# Patient Record
Sex: Female | Born: 1935 | Race: White | Hispanic: No | Smoking: Former smoker
Health system: Southern US, Community
[De-identification: ages and names within clinical notes are randomized; demographics above are authoritative.]

## PROBLEM LIST (undated history)

## (undated) DIAGNOSIS — E7800 Pure hypercholesterolemia, unspecified: Secondary | ICD-10-CM

## (undated) DIAGNOSIS — K5790 Diverticulosis of intestine, part unspecified, without perforation or abscess without bleeding: Secondary | ICD-10-CM

## (undated) DIAGNOSIS — I10 Essential (primary) hypertension: Secondary | ICD-10-CM

## (undated) DIAGNOSIS — N289 Disorder of kidney and ureter, unspecified: Secondary | ICD-10-CM

## (undated) DIAGNOSIS — I2510 Atherosclerotic heart disease of native coronary artery without angina pectoris: Secondary | ICD-10-CM

## (undated) DIAGNOSIS — Z95810 Presence of automatic (implantable) cardiac defibrillator: Secondary | ICD-10-CM

## (undated) DIAGNOSIS — K219 Gastro-esophageal reflux disease without esophagitis: Secondary | ICD-10-CM

## (undated) DIAGNOSIS — I4891 Unspecified atrial fibrillation: Secondary | ICD-10-CM

## (undated) HISTORY — PX: CORONARY ARTERY BYPASS GRAFT: SHX141

## (undated) HISTORY — PX: ABDOMINAL AORTIC ANEURYSM REPAIR: SUR1152

## (undated) HISTORY — PX: PACEMAKER PLACEMENT: SHX43

---

## 2008-06-18 ENCOUNTER — Ambulatory Visit: Payer: Self-pay | Admitting: Oncology

## 2008-07-09 ENCOUNTER — Ambulatory Visit: Payer: Self-pay | Admitting: Orthopaedic Surgery

## 2008-07-15 ENCOUNTER — Ambulatory Visit: Payer: Self-pay | Admitting: Orthopaedic Surgery

## 2008-07-18 ENCOUNTER — Ambulatory Visit: Payer: Self-pay | Admitting: Oncology

## 2008-07-19 ENCOUNTER — Ambulatory Visit: Payer: Self-pay | Admitting: Oncology

## 2008-07-25 ENCOUNTER — Ambulatory Visit: Payer: Self-pay | Admitting: Oncology

## 2008-08-14 ENCOUNTER — Ambulatory Visit: Payer: Self-pay | Admitting: Orthopaedic Surgery

## 2008-08-16 ENCOUNTER — Emergency Department: Payer: Self-pay | Admitting: Emergency Medicine

## 2008-08-16 ENCOUNTER — Other Ambulatory Visit: Payer: Self-pay

## 2008-08-19 ENCOUNTER — Ambulatory Visit: Payer: Self-pay | Admitting: Oncology

## 2009-04-16 IMAGING — CT CT CHEST-ABD-PELV W/ CM
1 of 3 series · 11 of 30 positions shown, 17 images · non-contrast
Comparison: none

REASON FOR EXAM: Abnormal MRI showing bone lesions
COMMENTS:

[Series 2: soft tissue · axial · 0.74mm/px · z∈[-940,-390]mm · 11 of 132 slices shown, 17 images]
[im 11/132  mediastinal]
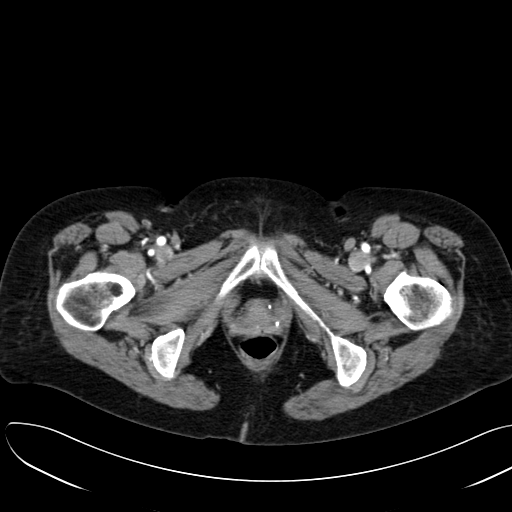
[im 11/132  bone]
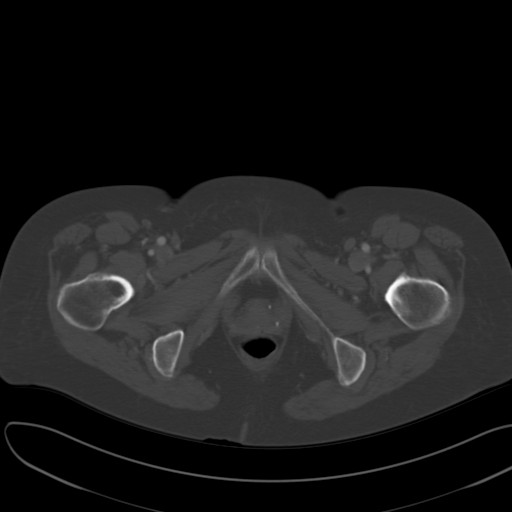
[im 22/132  mediastinal]
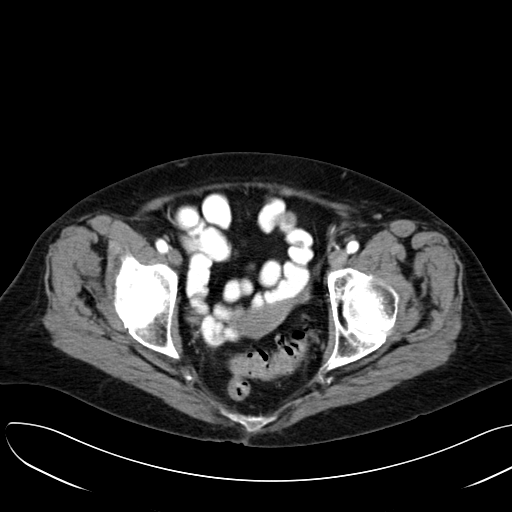
[im 33/132  mediastinal]
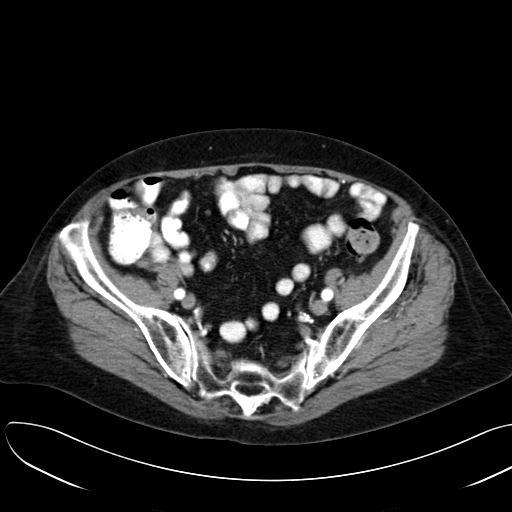
[im 44/132  mediastinal]
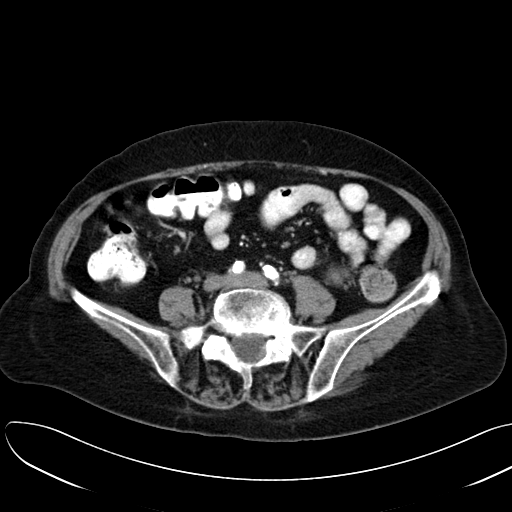
[im 55/132  mediastinal]
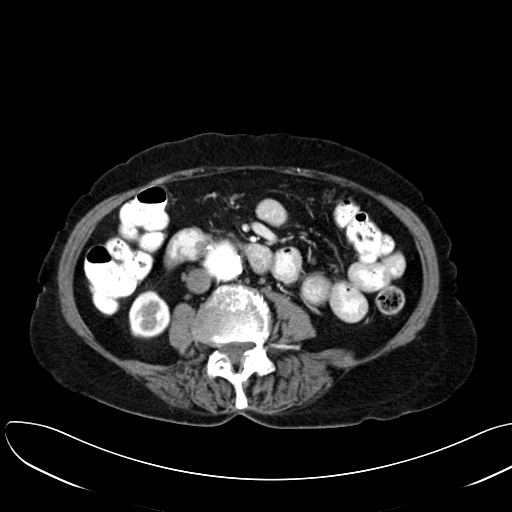
[im 66/132  mediastinal]
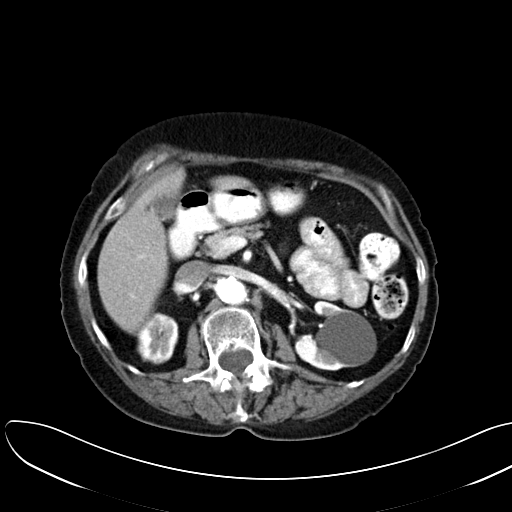
[im 77/132  mediastinal]
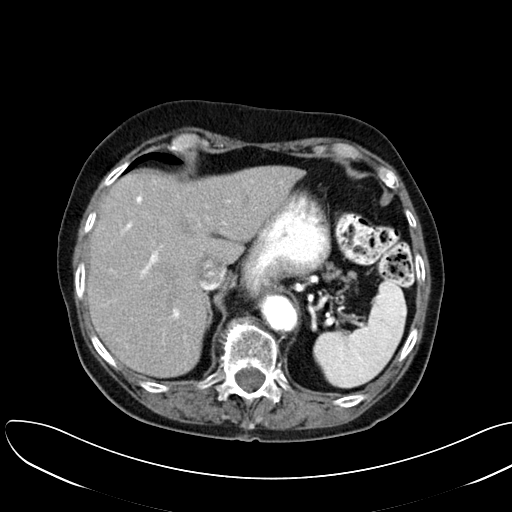
[im 88/132  mediastinal]
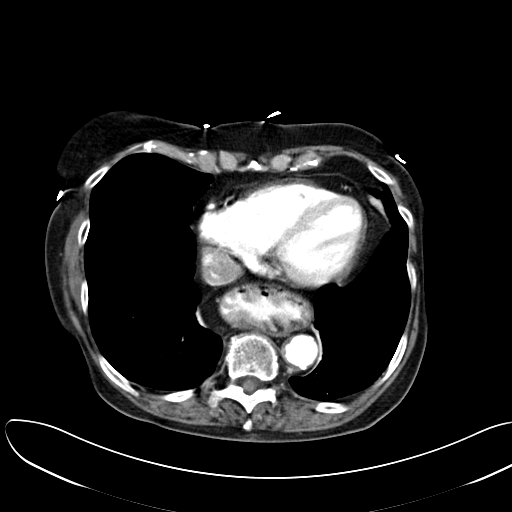
[im 88/132  lung]
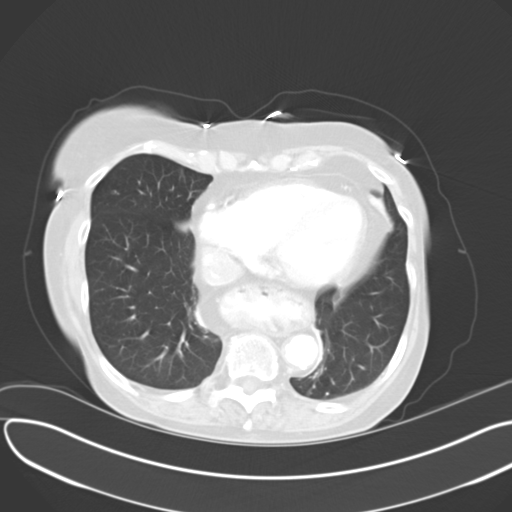
[im 99/132  mediastinal]
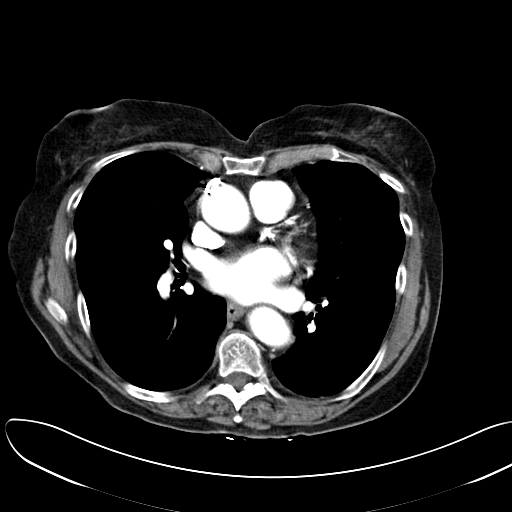
[im 99/132  lung]
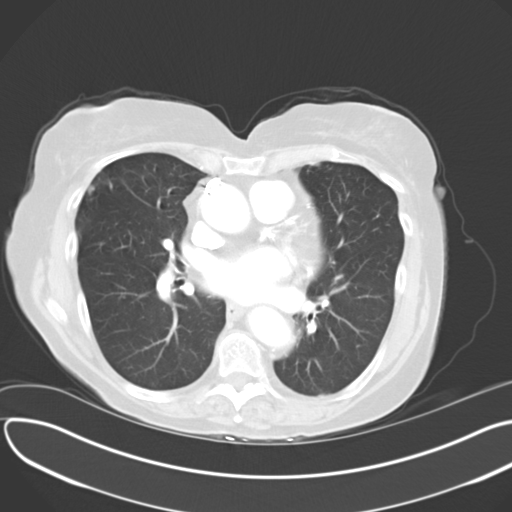
[im 99/132  bone]
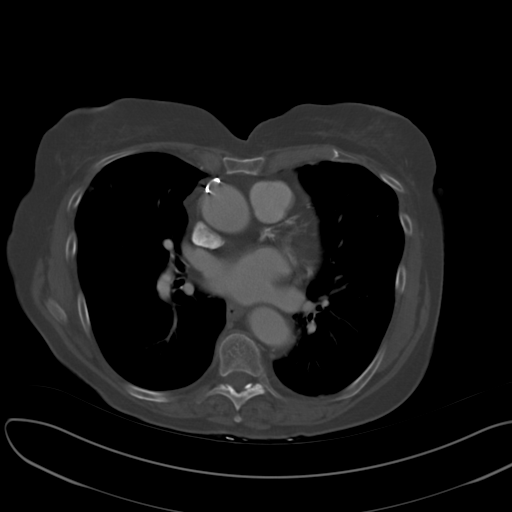
[im 110/132  mediastinal]
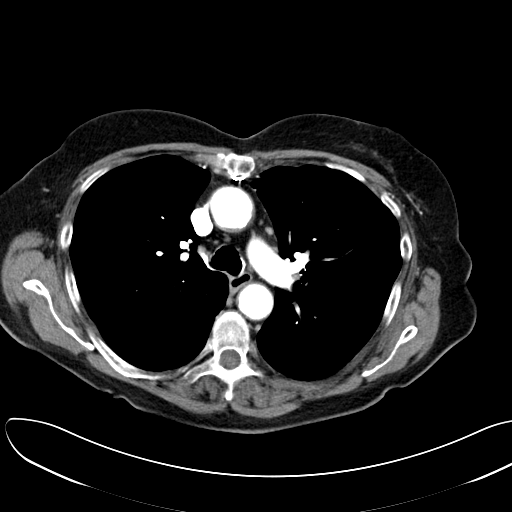
[im 110/132  lung]
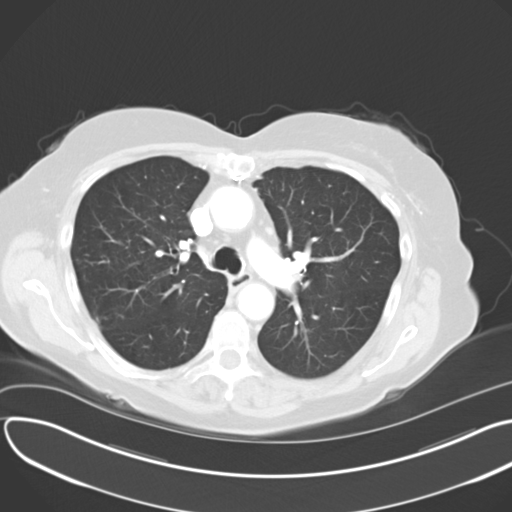
[im 121/132  mediastinal]
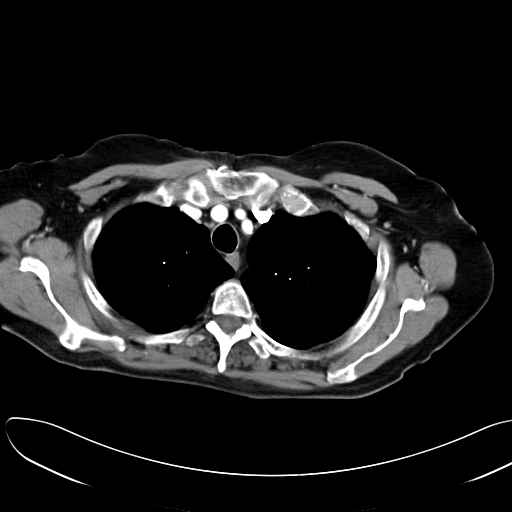
[im 121/132  lung]
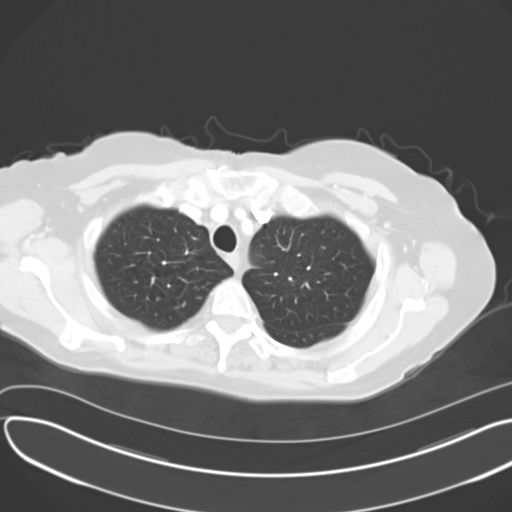

[11 of 30 positions shown; findings below may reference images not displayed]

PROCEDURE:     CT  - CT CHEST ABDOMEN AND PELVIS W  - July 25, 2008  [DATE]

RESULT:     The patient is undergoing evaluation for possible metastatic
disease. The patient is noted to have an abnormal Bone Scan in the region of
the LEFT femur. Prior plain films and MRI findings revealed abnormality in
this distal LEFT femur as well.

CT SCAN OF CHEST:  The patient received 100 ml of [id number] for this study.

There is a large, hiatal hernia/partially intrathoracic stomach. The cardiac
chambers are top normal in size. The caliber of the thoracic aorta is
normal. Contrast within the pulmonary artery centrally appears normal. The
study was not tailored for evaluation of the peripheral pulmonary arterial
tree. There is no evidence of mediastinal or hilar or axillary
lymphadenopathy. I see no pleural or pericardial effusion. There is a soft
tissue nodular density in the lateral aspect of the RIGHT breast on image
#33. At lung window settings, there are emphysematous changes bilaterally. I
do not see alveolar infiltrates. There are minimal areas of pleural
thickening noted. There is subtle subpleural nodularity noted in the
inferior aspect of the RIGHT upper lobe laterally, demonstrated best on
images #25 through 31. The LEFT lung exhibits no abnormal nodules.

At bone window settings, the patient has undergone prior median sternotomy.
I do not see evidence of a lytic or blastic rib lesion. The thoracic
vertebral bodies are preserved in height.
CONCLUSION: 1.  I do not see findings suspicious for pulmonary parenchymal malignancy or
mediastinal or hilar masses or lymphadenopathy.
2.  No bony lesion of the thorax is identified beyond the post surgical
changes from median sternotomy.
3.  There is a large hiatal hernia/partially intrathoracic stomach.

CT SCAN OF ABDOMEN AND PELVIS:  The patient received the aforementioned IV
contrast as well as oral contrast material. The liver, spleen, gallbladder,
adrenal glands and pancreas exhibit no acute abnormality. There is an
abdominal aortic aneurysm that arises below the level of the renal arteries.
It measures 4.1 cm AP x approximately 4.2 cm transversely. I see no evidence
of perianeurysmal leakage of blood. The caliber of the aorta becomes normal
again above the bifurcation but in then dilates just above the bifurcation
to a maximal transverse dimension of 2.5 cm. The common iliac arteries are
mildly ectatic but no discrete aneurysm is seen. The partially contrast
filled loops of small and large bowel are normal in appearance. There are a
few sigmoid diverticula visible. The uterus and adnexal structures appear
appropriate for age. No inguinal lymphadenopathy is identified. At bone
window settings, there is gentle curvature of the lumbar spine convex toward
the RIGHT. Mild degenerative change is noted. I see no lytic or blastic
lesions. There is no evidence of ascites.
IMPRESSION: 1.     Please see the discussion above regarding findings in the thorax.
2.     Within the abdomen and pelvis, I do not see findings suspicious for
malignancy. There is an abdominal aortic aneurysm, as described above, with
maximal dimension of 4.2 cm.
3.     I see no acute bowel abnormality or evidence of adrenal masses or
metastatic disease. No intra-abdominal lymphadenopathy is seen.
4.     I do not see acute abnormality of either kidney. There is a large,
cortically based cystic appearing structure involving the midpole of the
LEFT kidney and has Hounsfield measurements of approximately 3 on both the
immediate and delayed images.

## 2010-01-08 ENCOUNTER — Ambulatory Visit: Payer: Self-pay | Admitting: Oncology

## 2010-01-21 LAB — CMP (CANCER CENTER ONLY)
ALT(SGPT): 19 U/L (ref 10–47)
Albumin: 3.5 g/dL (ref 3.3–5.5)
Alkaline Phosphatase: 81 U/L (ref 26–84)
CO2: 28 mEq/L (ref 18–33)
Glucose, Bld: 145 mg/dL — ABNORMAL HIGH (ref 73–118)
Potassium: 4.2 mEq/L (ref 3.3–4.7)
Sodium: 146 mEq/L — ABNORMAL HIGH (ref 128–145)
Total Protein: 7.3 g/dL (ref 6.4–8.1)

## 2010-01-21 LAB — CBC WITH DIFFERENTIAL (CANCER CENTER ONLY)
BASO#: 0 10*3/uL (ref 0.0–0.2)
BASO%: 0.5 % (ref 0.0–2.0)
EOS%: 3.1 % (ref 0.0–7.0)
HCT: 35.4 % (ref 34.8–46.6)
LYMPH%: 21.4 % (ref 14.0–48.0)
MCH: 30 pg (ref 26.0–34.0)
MCHC: 32.9 g/dL (ref 32.0–36.0)
MCV: 91 fL (ref 81–101)
NEUT%: 69.1 % (ref 39.6–80.0)
RDW: 17.8 % — ABNORMAL HIGH (ref 10.5–14.6)

## 2010-01-21 LAB — MORPHOLOGY - CHCC SATELLITE: Platelet Morphology: NORMAL

## 2010-01-25 LAB — IRON AND TIBC
%SAT: 46 % (ref 20–55)
TIBC: 338 ug/dL (ref 250–470)

## 2010-01-25 LAB — PROTEIN ELECTROPHORESIS, SERUM
Beta Globulin: 6.5 % (ref 4.7–7.2)
Total Protein, Serum Electrophoresis: 7 g/dL (ref 6.0–8.3)

## 2010-01-25 LAB — RETICULOCYTES (CHCC)
ABS Retic: 31.4 10*3/uL (ref 19.0–186.0)
RBC.: 3.93 MIL/uL (ref 3.87–5.11)

## 2010-01-25 LAB — FOLATE: Folate: >20 ng/mL

## 2010-01-25 LAB — FERRITIN: Ferritin: 41 ng/mL (ref 10–291)

## 2010-02-04 LAB — CBC WITH DIFFERENTIAL (CANCER CENTER ONLY)
BASO%: 0.3 % (ref 0.0–2.0)
EOS%: 3.5 % (ref 0.0–7.0)
Eosinophils Absolute: 0.2 10*3/uL (ref 0.0–0.5)
MCH: 30.1 pg (ref 26.0–34.0)
MCHC: 32.5 g/dL (ref 32.0–36.0)
MONO%: 7.6 % (ref 0.0–13.0)
NEUT#: 3.8 10*3/uL (ref 1.5–6.5)
Platelets: 203 10*3/uL (ref 145–400)
RBC: 3.49 10*6/uL — ABNORMAL LOW (ref 3.70–5.32)
RDW: 17.3 % — ABNORMAL HIGH (ref 10.5–14.6)

## 2010-02-22 ENCOUNTER — Ambulatory Visit: Payer: Self-pay | Admitting: Oncology

## 2010-02-25 LAB — CBC WITH DIFFERENTIAL (CANCER CENTER ONLY)
BASO#: 0 10*3/uL (ref 0.0–0.2)
Eosinophils Absolute: 0.2 10*3/uL (ref 0.0–0.5)
HCT: 31.9 % — ABNORMAL LOW (ref 34.8–46.6)
HGB: 10.8 g/dL — ABNORMAL LOW (ref 11.6–15.9)
LYMPH%: 20.9 % (ref 14.0–48.0)
MCV: 92 fL (ref 81–101)
MONO#: 0.4 10*3/uL (ref 0.1–0.9)
NEUT%: 66.2 % (ref 39.6–80.0)
RBC: 3.47 10*6/uL — ABNORMAL LOW (ref 3.70–5.32)
RDW: 15.4 % — ABNORMAL HIGH (ref 10.5–14.6)
WBC: 4.8 10*3/uL (ref 3.9–10.0)

## 2010-05-28 ENCOUNTER — Ambulatory Visit: Payer: Self-pay | Admitting: Oncology

## 2014-01-16 ENCOUNTER — Ambulatory Visit: Payer: Self-pay | Admitting: Physician Assistant

## 2014-01-23 ENCOUNTER — Emergency Department: Payer: Self-pay | Admitting: Emergency Medicine

## 2014-01-23 LAB — BASIC METABOLIC PANEL
Anion Gap: 6 — ABNORMAL LOW (ref 7–16)
BUN: 31 mg/dL — AB (ref 7–18)
CALCIUM: 8.7 mg/dL (ref 8.5–10.1)
CHLORIDE: 107 mmol/L (ref 98–107)
Co2: 22 mmol/L (ref 21–32)
Creatinine: 2.03 mg/dL — ABNORMAL HIGH (ref 0.60–1.30)
EGFR (Non-African Amer.): 22 — ABNORMAL LOW
GFR CALC AF AMER: 25 — AB
GLUCOSE: 137 mg/dL — AB (ref 65–99)
Osmolality: 279 (ref 275–301)
POTASSIUM: 4.4 mmol/L (ref 3.5–5.1)
Sodium: 135 mmol/L — ABNORMAL LOW (ref 136–145)

## 2014-01-23 LAB — CBC
HCT: 34 % — AB (ref 35.0–47.0)
HGB: 11.6 g/dL — AB (ref 12.0–16.0)
MCH: 34 pg (ref 26.0–34.0)
MCHC: 34.1 g/dL (ref 32.0–36.0)
MCV: 100 fL (ref 80–100)
PLATELETS: 173 10*3/uL (ref 150–440)
RBC: 3.41 10*6/uL — AB (ref 3.80–5.20)
RDW: 14 % (ref 11.5–14.5)
WBC: 7.8 10*3/uL (ref 3.6–11.0)

## 2014-01-24 LAB — PROTIME-INR
INR: 2.4
Prothrombin Time: 25.6 secs — ABNORMAL HIGH (ref 11.5–14.7)

## 2014-01-24 LAB — APTT: Activated PTT: 56.7 secs — ABNORMAL HIGH (ref 23.6–35.9)

## 2015-05-27 ENCOUNTER — Encounter: Payer: Self-pay | Admitting: *Deleted

## 2015-05-27 ENCOUNTER — Emergency Department: Payer: Medicare Other

## 2015-05-27 ENCOUNTER — Observation Stay
Admission: EM | Admit: 2015-05-27 | Discharge: 2015-05-29 | Disposition: A | Discharge status: Home / Self Care | Payer: Medicare Other | Attending: Internal Medicine | Admitting: Internal Medicine

## 2015-05-27 DIAGNOSIS — R410 Disorientation, unspecified: Secondary | ICD-10-CM | POA: Diagnosis present

## 2015-05-27 DIAGNOSIS — R55 Syncope and collapse: Secondary | ICD-10-CM | POA: Diagnosis not present

## 2015-05-27 DIAGNOSIS — I6529 Occlusion and stenosis of unspecified carotid artery: Secondary | ICD-10-CM | POA: Insufficient documentation

## 2015-05-27 DIAGNOSIS — Z888 Allergy status to other drugs, medicaments and biological substances status: Secondary | ICD-10-CM | POA: Insufficient documentation

## 2015-05-27 DIAGNOSIS — Z881 Allergy status to other antibiotic agents status: Secondary | ICD-10-CM | POA: Insufficient documentation

## 2015-05-27 DIAGNOSIS — N189 Chronic kidney disease, unspecified: Secondary | ICD-10-CM | POA: Insufficient documentation

## 2015-05-27 DIAGNOSIS — M7989 Other specified soft tissue disorders: Secondary | ICD-10-CM

## 2015-05-27 DIAGNOSIS — R000 Tachycardia, unspecified: Secondary | ICD-10-CM | POA: Diagnosis not present

## 2015-05-27 DIAGNOSIS — Z79899 Other long term (current) drug therapy: Secondary | ICD-10-CM | POA: Insufficient documentation

## 2015-05-27 DIAGNOSIS — H539 Unspecified visual disturbance: Secondary | ICD-10-CM | POA: Insufficient documentation

## 2015-05-27 DIAGNOSIS — R319 Hematuria, unspecified: Secondary | ICD-10-CM | POA: Insufficient documentation

## 2015-05-27 DIAGNOSIS — M545 Low back pain: Secondary | ICD-10-CM | POA: Diagnosis not present

## 2015-05-27 DIAGNOSIS — R42 Dizziness and giddiness: Secondary | ICD-10-CM | POA: Insufficient documentation

## 2015-05-27 DIAGNOSIS — Z950 Presence of cardiac pacemaker: Secondary | ICD-10-CM | POA: Insufficient documentation

## 2015-05-27 DIAGNOSIS — Z951 Presence of aortocoronary bypass graft: Secondary | ICD-10-CM | POA: Insufficient documentation

## 2015-05-27 DIAGNOSIS — R350 Frequency of micturition: Secondary | ICD-10-CM | POA: Insufficient documentation

## 2015-05-27 DIAGNOSIS — Z7901 Long term (current) use of anticoagulants: Secondary | ICD-10-CM | POA: Diagnosis not present

## 2015-05-27 DIAGNOSIS — T671XXD Heat syncope, subsequent encounter: Secondary | ICD-10-CM

## 2015-05-27 DIAGNOSIS — R748 Abnormal levels of other serum enzymes: Secondary | ICD-10-CM | POA: Insufficient documentation

## 2015-05-27 DIAGNOSIS — I129 Hypertensive chronic kidney disease with stage 1 through stage 4 chronic kidney disease, or unspecified chronic kidney disease: Secondary | ICD-10-CM | POA: Diagnosis not present

## 2015-05-27 DIAGNOSIS — I482 Chronic atrial fibrillation: Secondary | ICD-10-CM | POA: Diagnosis not present

## 2015-05-27 DIAGNOSIS — I2510 Atherosclerotic heart disease of native coronary artery without angina pectoris: Secondary | ICD-10-CM | POA: Insufficient documentation

## 2015-05-27 DIAGNOSIS — R6510 Systemic inflammatory response syndrome (SIRS) of non-infectious origin without acute organ dysfunction: Secondary | ICD-10-CM

## 2015-05-27 DIAGNOSIS — R4182 Altered mental status, unspecified: Secondary | ICD-10-CM | POA: Diagnosis not present

## 2015-05-27 DIAGNOSIS — N3000 Acute cystitis without hematuria: Secondary | ICD-10-CM | POA: Insufficient documentation

## 2015-05-27 DIAGNOSIS — A419 Sepsis, unspecified organism: Secondary | ICD-10-CM | POA: Diagnosis present

## 2015-05-27 DIAGNOSIS — R531 Weakness: Secondary | ICD-10-CM | POA: Insufficient documentation

## 2015-05-27 DIAGNOSIS — I214 Non-ST elevation (NSTEMI) myocardial infarction: Secondary | ICD-10-CM

## 2015-05-27 DIAGNOSIS — Z8679 Personal history of other diseases of the circulatory system: Secondary | ICD-10-CM | POA: Diagnosis not present

## 2015-05-27 DIAGNOSIS — E780 Pure hypercholesterolemia: Secondary | ICD-10-CM | POA: Diagnosis not present

## 2015-05-27 DIAGNOSIS — E785 Hyperlipidemia, unspecified: Secondary | ICD-10-CM | POA: Diagnosis not present

## 2015-05-27 DIAGNOSIS — R609 Edema, unspecified: Secondary | ICD-10-CM

## 2015-05-27 DIAGNOSIS — K219 Gastro-esophageal reflux disease without esophagitis: Secondary | ICD-10-CM | POA: Insufficient documentation

## 2015-05-27 HISTORY — DX: Pure hypercholesterolemia, unspecified: E78.00

## 2015-05-27 HISTORY — DX: Gastro-esophageal reflux disease without esophagitis: K21.9

## 2015-05-27 HISTORY — DX: Disorder of kidney and ureter, unspecified: N28.9

## 2015-05-27 HISTORY — DX: Unspecified atrial fibrillation: I48.91

## 2015-05-27 HISTORY — DX: Essential (primary) hypertension: I10

## 2015-05-27 LAB — URINALYSIS COMPLETE WITH MICROSCOPIC (ARMC ONLY)
Bilirubin Urine: NEGATIVE
Glucose, UA: NEGATIVE mg/dL
Nitrite: NEGATIVE
PH: 5 (ref 5.0–8.0)
Protein, ur: NEGATIVE mg/dL
Specific Gravity, Urine: 1.013 (ref 1.005–1.030)

## 2015-05-27 LAB — COMPREHENSIVE METABOLIC PANEL
ALK PHOS: 90 U/L (ref 38–126)
ALT: 18 U/L (ref 14–54)
ANION GAP: 13 (ref 5–15)
AST: 26 U/L (ref 15–41)
Albumin: 4.5 g/dL (ref 3.5–5.0)
BILIRUBIN TOTAL: 1.1 mg/dL (ref 0.3–1.2)
BUN: 31 mg/dL — ABNORMAL HIGH (ref 6–20)
CALCIUM: 9.6 mg/dL (ref 8.9–10.3)
CO2: 24 mmol/L (ref 22–32)
Chloride: 106 mmol/L (ref 101–111)
Creatinine, Ser: 1.78 mg/dL — ABNORMAL HIGH (ref 0.44–1.00)
GFR calc Af Amer: 28 mL/min — ABNORMAL LOW (ref >60)
GFR calc non Af Amer: 24 mL/min — ABNORMAL LOW (ref >60)
Glucose, Bld: 111 mg/dL — ABNORMAL HIGH (ref 65–99)
POTASSIUM: 4.7 mmol/L (ref 3.5–5.1)
SODIUM: 143 mmol/L (ref 135–145)
Total Protein: 7.9 g/dL (ref 6.5–8.1)

## 2015-05-27 LAB — CBC
HCT: 42.8 % (ref 35.0–47.0)
Hemoglobin: 13.9 g/dL (ref 12.0–16.0)
MCH: 31.7 pg (ref 26.0–34.0)
MCHC: 32.3 g/dL (ref 32.0–36.0)
MCV: 97.9 fL (ref 80.0–100.0)
Platelets: 212 10*3/uL (ref 150–440)
RBC: 4.37 MIL/uL (ref 3.80–5.20)
RDW: 13.8 % (ref 11.5–14.5)
WBC: 11 10*3/uL (ref 3.6–11.0)

## 2015-05-27 LAB — PROTIME-INR
INR: 1.45
Prothrombin Time: 17.8 seconds — ABNORMAL HIGH (ref 11.4–15.0)

## 2015-05-27 LAB — TROPONIN I: TROPONIN I: 0.04 ng/mL — AB (ref <0.031)

## 2015-05-27 LAB — LACTIC ACID, PLASMA: Lactic Acid, Venous: 2.3 mmol/L (ref 0.5–2.0)

## 2015-05-27 MED ORDER — VITAMIN D3 25 MCG (1000 UNIT) PO TABS
1000.0000 [IU] | ORAL_TABLET | Freq: Every day | ORAL | Status: DC
  Administered 2015-05-28: 1000 [IU] via ORAL
  Filled 2015-05-27 (×3): qty 1

## 2015-05-27 MED ORDER — METOPROLOL SUCCINATE ER 100 MG PO TB24
100.0000 mg | ORAL_TABLET | Freq: Every day | ORAL | Status: DC
  Administered 2015-05-28 – 2015-05-29 (×2): 100 mg via ORAL
  Filled 2015-05-27 (×2): qty 1

## 2015-05-27 MED ORDER — VANCOMYCIN HCL IN DEXTROSE 1-5 GM/200ML-% IV SOLN: 1000.0000 mg | Freq: Once | INTRAVENOUS | Status: DC

## 2015-05-27 MED ORDER — SODIUM CHLORIDE 0.9 % IV BOLUS (SEPSIS)
1000.0000 mL | Freq: Once | INTRAVENOUS | Status: AC
  Administered 2015-05-27: 1000 mL via INTRAVENOUS

## 2015-05-27 MED ORDER — PIPERACILLIN-TAZOBACTAM 3.375 G IVPB 30 MIN
3.3750 g | Freq: Once | INTRAVENOUS | Status: AC
  Administered 2015-05-27: 3.375 g via INTRAVENOUS

## 2015-05-27 MED ORDER — AMLODIPINE BESYLATE 5 MG PO TABS
2.5000 mg | ORAL_TABLET | Freq: Every day | ORAL | Status: DC
  Administered 2015-05-28 – 2015-05-29 (×2): 2.5 mg via ORAL
  Filled 2015-05-27 (×2): qty 1

## 2015-05-27 MED ORDER — PANTOPRAZOLE SODIUM 40 MG PO TBEC
40.0000 mg | DELAYED_RELEASE_TABLET | Freq: Every day | ORAL | Status: DC
  Administered 2015-05-28 – 2015-05-29 (×2): 40 mg via ORAL
  Filled 2015-05-27 (×2): qty 1

## 2015-05-27 MED ORDER — PIPERACILLIN-TAZOBACTAM 3.375 G IVPB: 3.3750 g | Freq: Once | INTRAVENOUS | Status: DC

## 2015-05-27 MED ORDER — ASPIRIN 81 MG PO CHEW
324.0000 mg | CHEWABLE_TABLET | Freq: Once | ORAL | Status: AC
  Administered 2015-05-27: 324 mg via ORAL

## 2015-05-27 MED ORDER — ASPIRIN 81 MG PO CHEW
CHEWABLE_TABLET | ORAL | Status: AC
  Administered 2015-05-27: 324 mg via ORAL
  Filled 2015-05-27: qty 4

## 2015-05-27 MED ORDER — WARFARIN SODIUM 2 MG PO TABS
2.0000 mg | ORAL_TABLET | Freq: Every day | ORAL | Status: DC
  Administered 2015-05-28: 2 mg via ORAL
  Filled 2015-05-27 (×2): qty 1

## 2015-05-27 MED ORDER — CRANBERRY-VITAMIN C 450-125 MG PO CAPS: ORAL_CAPSULE | Freq: Two times a day (BID) | ORAL | Status: DC

## 2015-05-27 MED ORDER — OXYBUTYNIN CHLORIDE 5 MG PO TABS
5.0000 mg | ORAL_TABLET | Freq: Every day | ORAL | Status: DC
  Administered 2015-05-28 – 2015-05-29 (×2): 5 mg via ORAL
  Filled 2015-05-27 (×2): qty 1

## 2015-05-27 MED ORDER — ROSUVASTATIN CALCIUM 5 MG PO TABS
5.0000 mg | ORAL_TABLET | Freq: Every day | ORAL | Status: DC
  Administered 2015-05-28 – 2015-05-29 (×2): 5 mg via ORAL
  Filled 2015-05-27 (×2): qty 1

## 2015-05-27 MED ORDER — BACLOFEN 5 MG HALF TABLET
5.0000 mg | ORAL_TABLET | Freq: Three times a day (TID) | ORAL | Status: DC | PRN
  Filled 2015-05-27: qty 1

## 2015-05-27 MED ORDER — SODIUM CHLORIDE 0.9 % IV BOLUS (SEPSIS)
1100.0000 mL | Freq: Once | INTRAVENOUS | Status: AC
Start: 2015-05-27 — End: 2015-05-27
  Administered 2015-05-27: 1100 mL via INTRAVENOUS

## 2015-05-27 MED ORDER — PIPERACILLIN-TAZOBACTAM 3.375 G IVPB
INTRAVENOUS | Status: AC
  Administered 2015-05-27: 3.375 g via INTRAVENOUS
  Filled 2015-05-27: qty 50

## 2015-05-27 MED ORDER — CEFTRIAXONE SODIUM IN DEXTROSE 20 MG/ML IV SOLN
1.0000 g | INTRAVENOUS | Status: DC
  Administered 2015-05-28 – 2015-05-29 (×2): 1 g via INTRAVENOUS
  Filled 2015-05-27 (×3): qty 50

## 2015-05-27 MED ORDER — ACETAMINOPHEN 500 MG PO TABS: 1000.0000 mg | ORAL_TABLET | Freq: Three times a day (TID) | ORAL | Status: DC | PRN

## 2015-05-27 NOTE — Progress Notes (Addendum)
[place] [facility] History and physical  PATIENT NAME: Claire Kelley    MR#:  [id number]  DATE OF BIRTH:  [birth year]  DATE OF ADMISSION:  05/27/2015  PRIMARY CARE PHYSICIAN: Dr Lora Paula in [facility]  REQUESTING/REFERRING PHYSICIAN: Dr Edd Fabian  CHIEF COMPLAINT:   I did not feel good since I woke up HISTORY OF PRESENT ILLNESS:  Claire Kelley  is a 81 y.o. female with a known history of A. fib on Coumadin, hypertension, GERD, hyperlipidemia comes to the emergency room accompanied by daughters with increasing weakness and altered mental status as noted by daughters. According to patient she woke up this morning not feeling too well. She was brought to the emergency room was found to be tachycardic in the 110s. Her lactic acid level was 2.3. Patient did have some low back pain. UA was positive for hematuria and few pus cells. She is afebrile blood pressure is stable mild tachycardic.  In the emergency room, patient received IV vancomycin and Zosyn due to her elevated lactic acid and meeting criteria for SIRS. She is being admitted for further evaluation and management.  PAST MEDICAL HISTORY:   Past Medical History  Diagnosis Date  . A-fib   . Hypertension   . GERD (gastroesophageal reflux disease)   . Renal disorder     CKD  . High cholesterol     PAST SURGICAL HISTOIRY:   Past Surgical History  Procedure Laterality Date  . Pacemaker placement    . Abdominal aortic aneurysm repair    . Coronary artery bypass graft      SOCIAL HISTORY:   History  Substance Use Topics  . Smoking status: Never Smoker   . Smokeless tobacco: Not on file  . Alcohol Use: No    FAMILY HISTORY:  No family history on file.  DRUG ALLERGIES:   Allergies  Allergen Reactions  . Bactrim [Sulfamethoxazole-Trimethoprim] Other (See Comments)    Reaction:  Unknown   . Ciprocinonide [Fluocinolone] Other (See Comments)    Reaction:  Unknown   . Lipitor  [Atorvastatin] Other (See Comments)    Reaction:  Leg cramps   . Macrobid [employer] Macro] Other (See Comments)    Reaction:  Unknown   . Paxil [Paroxetine Hcl] Other (See Comments)    Reaction:  Made pt "loopy"    REVIEW OF SYSTEMS:  Review of Systems  Constitutional: Negative for fever, chills and diaphoresis.  HENT: Negative for congestion, ear pain, hearing loss, nosebleeds and sore throat.   Eyes: Negative for blurred vision, double vision, photophobia and pain.  Respiratory: Negative for hemoptysis, sputum production, wheezing and stridor.   Cardiovascular: Negative for orthopnea, claudication and leg swelling.  Gastrointestinal: Negative for heartburn and abdominal pain.  Genitourinary: Positive for frequency. Negative for dysuria.  Musculoskeletal: Positive for back pain. Negative for joint pain and neck pain.  Skin: Negative for rash.  Neurological: Positive for weakness. Negative for sensory change, speech change and headaches.  Endo/Heme/Allergies: Does not bruise/bleed easily.  Psychiatric/Behavioral: Negative for memory loss. The patient is not nervous/anxious.   All other systems reviewed and are negative.    MEDICATIONS AT HOME:   Prior to Admission medications   Medication Sig Start Date End Date Taking? Authorizing Provider  acetaminophen (TYLENOL) 650 MG CR tablet Take 1,300 mg by mouth every 8 (eight) hours as needed for pain.   Yes Historical Provider, MD  amLODipine (NORVASC) 2.5 MG tablet Take 2.5 mg by mouth daily.  Yes Historical Provider, MD  aspirin EC 81 MG tablet Take 81 mg by mouth daily.   Yes Historical Provider, MD  baclofen (LIORESAL) 10 MG tablet Take 5 mg by mouth 3 (three) times daily as needed for muscle spasms.   Yes Historical Provider, MD  Cholecalciferol (VITAMIN D3) 1000 UNITS CAPS Take 1 capsule by mouth daily.   Yes Historical Provider, MD  clotrimazole-betamethasone (LOTRISONE) cream Apply 1 application topically 2 (two)  times daily as needed (for irritation).   Yes Historical Provider, MD  CRANBERRY-VITAMIN C PO Take 1 capsule by mouth 2 (two) times daily.   Yes Historical Provider, MD  Iron-Vitamin C (VITRON-C PO) Take 1 tablet by mouth daily.   Yes Historical Provider, MD  metoprolol succinate (TOPROL-XL) 100 MG 24 hr tablet Take 100 mg by mouth daily.   Yes Historical Provider, MD  oxybutynin (DITROPAN) 5 MG tablet Take 5 mg by mouth daily.   Yes Historical Provider, MD  pantoprazole (PROTONIX) 40 MG tablet Take 40 mg by mouth daily.   Yes Historical Provider, MD  rosuvastatin (CRESTOR) 5 MG tablet Take 5 mg by mouth daily.   Yes Historical Provider, MD  warfarin (COUMADIN) 1 MG tablet Take 1-2 mg by mouth daily. Pt takes 2mg  on Wednesday and 1mg  all other days.   Yes Historical Provider, MD      VITAL SIGNS:  Blood pressure 150/81, pulse 92, temperature 98 F (36.7 C), temperature source Oral, resp. rate 18, height 5\' 7"  (1.702 m), weight 70.308 kg (155 lb), SpO2 96 %.  PHYSICAL EXAMINATION:  GENERAL:  81 y.o.-year-old patient lying in the bed with no acute distress.  EYES: Pupils equal, round, reactive to light and accommodation. No scleral icterus. Extraocular muscles intact.  HEENT: Head atraumatic, normocephalic. Oropharynx and nasopharynx clear.  NECK:  Supple, no jugular venous distention. No thyroid enlargement, no tenderness.  LUNGS: Normal breath sounds bilaterally, no wheezing, rales,rhonchi or crepitation. No use of accessory muscles of respiration.  CARDIOVASCULAR: S1, S2 normal. No murmurs, rubs, or gallops.  ABDOMEN: Soft, nontender, nondistended. Bowel sounds present. No organomegaly or mass.  EXTREMITIES: No pedal edema, cyanosis, or clubbing.  NEUROLOGIC: Cranial nerves II through XII are intact. Muscle strength 5/5 in all extremities. Sensation intact. Gait not checked.  PSYCHIATRIC: The patient is alert and oriented x 2. Some anxiety and mild confusion SKIN: No obvious rash, lesion,  or ulcer.   LABORATORY PANEL:   CBC  Recent Labs Lab 05/27/15 1721  WBC 11.0  HGB 13.9  HCT 42.8  PLT 212   ------------------------------------------------------------------------------------------------------------------  Chemistries   Recent Labs Lab 05/27/15 1721  NA 143  K 4.7  CL 106  CO2 24  GLUCOSE 111*  BUN 31*  CREATININE 1.78*  CALCIUM 9.6  AST 26  ALT 18  ALKPHOS 90  BILITOT 1.1   ------------------------------------------------------------------------------------------------------------------  Cardiac Enzymes  Recent Labs Lab 05/27/15 1721  TROPONINI 0.04*   ------------------------------------------------------------------------------------------------------------------  RADIOLOGY:  Dg Chest 2 View  05/27/2015   CLINICAL DATA:  Nervousness.  Visual disturbance.  EXAM: CHEST  2 VIEW  COMPARISON:  None.  FINDINGS: Normal cardiac silhouette and mediastinal contours. Atherosclerotic plaque within the thoracic aorta. Post median sternotomy and CABG. Left anterior chest wall dual lead pacemaker tips overlie the expected location of the right atrium and ventricle. The lungs appear mildly hyperexpanded. There is mild elevation / eventration of the anterior aspect the right hemidiaphragm. No focal airspace opacities. No pleural effusion or pneumothorax. No evidence of edema. No  acute osseus abnormalities. Moderate scoliotic curvature of the thoracolumbar spine. Post endovascular repair of the abdominal aorta, incompletely evaluated.  IMPRESSION: Hyperexpanded lungs without acute cardiopulmonary disease.   Electronically Signed   By: Sandi Mariscal M.D.   On: 05/27/2015 19:25   Ct Head Wo Contrast  05/27/2015   CLINICAL DATA:  Confusion today  EXAM: CT HEAD WITHOUT CONTRAST  TECHNIQUE: Contiguous axial images were obtained from the base of the skull through the vertex without intravenous contrast.  COMPARISON:  None.  FINDINGS: Moderate global atrophy. Chronic  ischemic changes in the periventricular white matter. No mass effect, midline shift, or acute intracranial hemorrhage. Mastoid air cells are clear. Mucosal 6 vein throughout the paranasal sinuses is present.  IMPRESSION: No acute intracranial pathology.  Chronic changes are noted.   Electronically Signed   By: Marybelle Killings M.D.   On: 05/27/2015 18:31    EKG:   Afib with RVR IMPRESSION AND PLAN:    81 year old Caucasian female with past medical history of chronic A. fib on Coumadin, hypertension, GERD, hyperlipidemia comes in with  #1 altered mental status suspected due to mild UTI Admit to medical floor for overnight observation. IV fluids IV Rocephin Follow-up blood culture urine culture. Follow-up lactic acid level.  #2 chronic A. fib on Coumadin with INR 1.45. Patient has RBCs in her urine her H&H is stable therefore for now I will continue her Coumadin she does not have frank hematuria at present. Monitor INR.  #3 Hypertension Continue amlodipine and metoprolol #4 hyperlipidemia continue Crestor. #5 low back pain continue when necessary Tylenol. #6 DVT prophylaxis on Coumadin  All the records are reviewed and case discussed with ED provider. Management plans discussed with the patient, family and they are in agreement.  CODE STATUS: Full  TOTAL TIME TAKING CARE OF THIS PATIENT: 78minutes.    Chavez Rosol M.D on 05/27/2015 at 10:33 PM  Between 7am to 6pm - Pager - [phone]  After 6pm go to [email or web address] - password [email or web address] [facility]  [facility]  Office  [phone]  CC: Primary care physician; No primary care provider on file.

## 2015-05-27 NOTE — ED Notes (Signed)
Pt reports she woke up this morning and she felt nervous, dizzy and shaky. Pt daughter spoke with her around noon and states she was not making sense. Pt has not had her normal medications in several days. Per family, she also started recently on Baclofen for her back pain and has had urinary frequency.

## 2015-05-27 NOTE — ED Notes (Addendum)
Pt assisted to room commode; stand-by assist required only; pt denies dizziness or light-headedness; MAEW; cc urine specimen obtained; upon wiping, scant bright red noted on tissue; visually inspected rectum and vaginal area but no bleeding noted & pt/family denies hx of same; pt back to bed and Dr Edd Fabian notified

## 2015-05-27 NOTE — ED Notes (Signed)
Dr Edd Fabian notified lab reports lactic acid 2.3

## 2015-05-27 NOTE — ED Notes (Signed)
Pt uprite on stretcher in exam room with no distress noted, no c/o; family at bedside; pt requested something to eat at this time; pt & family informed pt to remain NPO as instructed by MD

## 2015-05-27 NOTE — ED Notes (Signed)
Pt resting with no complaints, daughters at bedside, pt answers all questions appropriately, however, gets "off topic" at times.

## 2015-05-27 NOTE — ED Provider Notes (Signed)
[facility] Emergency Department Provider Note  ____________________________________________  Time seen: Approximately 5:54 PM  I have reviewed the triage vital signs and the nursing notes.   HISTORY  Chief Complaint Altered Mental Status    HPI Claire Kelley is a 81 y.o. female with history of atrial fibrillation on Coumadin, hypertension, hypercholesterolemia, coronary artery disease status post CABG, status post pacemaker placement, history of AAA repair/stent resents for evaluation of altered mental status. The patient reports that she awoke from sleep this morning and "felt cross-eyed and lightheaded". When her daughters called her at approximately noon, she was only able to answer yes or no questions, she seemed confused. She has not taken her medications in several days. The only new medication that has been started is baclofen which was prescribed to her several weeks ago however she has only taken a total of 2 mg of baclofen since this was initiated. She denies any pain complaints, no chest pain, no abdominal pain, no difficulty breathing. She has had increased urinary frequency. Symptoms have been constant since onset. Current severity is moderate. No modifying factors.   Past Medical History  Diagnosis Date  . A-fib   . Hypertension   . GERD (gastroesophageal reflux disease)   . Renal disorder     CKD  . High cholesterol     There are no active problems to display for this patient.   Past Surgical History  Procedure Laterality Date  . Pacemaker placement    . Abdominal aortic aneurysm repair    . Coronary artery bypass graft      Current Outpatient Rx  Name  Route  Sig  Dispense  Refill  . acetaminophen (TYLENOL) 650 MG CR tablet   Oral   Take 1,300 mg by mouth every 8 (eight) hours as needed for pain.         Marland Kitchen amLODipine (NORVASC) 2.5 MG tablet   Oral   Take 2.5 mg by mouth daily.         Marland Kitchen aspirin EC 81 MG tablet   Oral  Take 81 mg by mouth daily.         . baclofen (LIORESAL) 10 MG tablet   Oral   Take 5 mg by mouth 3 (three) times daily as needed for muscle spasms.         . Cholecalciferol (VITAMIN D3) 1000 UNITS CAPS   Oral   Take 1 capsule by mouth daily.         Marland Kitchen CRANBERRY-VITAMIN C PO   Oral   Take 1 capsule by mouth 2 (two) times daily.         . Iron-Vitamin C (VITRON-C PO)   Oral   Take 1 tablet by mouth daily.         . metoprolol succinate (TOPROL-XL) 100 MG 24 hr tablet   Oral   Take 100 mg by mouth daily.         Marland Kitchen oxybutynin (DITROPAN) 5 MG tablet   Oral   Take 5 mg by mouth daily.         . pantoprazole (PROTONIX) 40 MG tablet   Oral   Take 40 mg by mouth daily.         . rosuvastatin (CRESTOR) 5 MG tablet   Oral   Take 5 mg by mouth daily.         Marland Kitchen warfarin (COUMADIN) 1 MG tablet   Oral   Take 1-2 mg by mouth daily. Pt  takes 2mg  on Wednesday and 1mg  all other days.           Allergies Bactrim; Ciprocinonide; Lipitor; Macrobid; and Paxil  No family history on file.  Social History History  Substance Use Topics  . Smoking status: Never Smoker   . Smokeless tobacco: Not on file  . Alcohol Use: No    Review of Systems Constitutional: No fever/chills Eyes: No visual changes. ENT: No sore throat. Cardiovascular: Denies chest pain. Respiratory: Denies shortness of breath. Gastrointestinal: No abdominal pain.  No nausea, no vomiting.  No diarrhea.  No constipation. Genitourinary: Negative for dysuria. Musculoskeletal: Negative for back pain. Skin: Negative for rash. Neurological: Negative for headaches, focal weakness or numbness.  10-point ROS otherwise negative.  ____________________________________________   PHYSICAL EXAM:  VITAL SIGNS: ED Triage Vitals  Enc Vitals Group     BP 05/27/15 1709 142/72 mmHg     Pulse Rate 05/27/15 1709 102     Resp 05/27/15 1709 18     Temp 05/27/15 1709 97.6 F (36.4 C)     Temp Source  05/27/15 1709 Oral     SpO2 05/27/15 1709 95 %     Weight 05/27/15 1709 155 lb (70.308 kg)     Height 05/27/15 1709 5\' 7"  (1.702 m)     Head Cir --      Peak Flow --      Pain Score --      Pain Loc --      Pain Edu? --      Excl. in [place]? --     Constitutional: Alert and oriented x3. Nontoxic appearing, needs repetitive prompting to perform tasks associated with the neurological exam but she is able to complete TASKS. Eyes: Conjunctivae are normal. PERRL. EOMI. Head: Atraumatic. Nose: No congestion/rhinnorhea. Mouth/Throat: Mucous membranes are moist.  Oropharynx non-erythematous. Neck: No stridor.  Cardiovascular: tachycardic rate, irregular rhythm. Grossly normal heart sounds.  Good peripheral circulation. Respiratory: Normal respiratory effort.  No retractions. Lungs CTAB. Gastrointestinal: Soft and nontender. No distention. No abdominal bruits. No CVA tenderness. Genitourinary: deferred Musculoskeletal: No lower extremity tenderness nor edema.  No joint effusions. Neurologic:  Normal speech and language. No gross focal neurologic deficits are appreciated. Speech is normal.  5 out of 5 strength in bilateral upper and lower extremities, sensation intact to light touch throughout, cranial nerves II through XII intact, normal finger-nose-finger and heel to shin. Skin:  Skin is warm, dry and intact. No rash noted. Psychiatric: Mood and affect are normal. Speech and behavior are normal.  ____________________________________________   LABS (all labs ordered are listed, but only abnormal results are displayed)  Labs Reviewed  COMPREHENSIVE METABOLIC PANEL - Abnormal; Notable for the following:    Glucose, Bld 111 (*)    BUN 31 (*)    Creatinine, Ser 1.78 (*)    GFR calc non Af Amer 24 (*)    GFR calc Af Amer 28 (*)    All other components within normal limits  PROTIME-INR - Abnormal; Notable for the following:    Prothrombin Time 17.8 (*)    All other components within normal  limits  TROPONIN I - Abnormal; Notable for the following:    Troponin I 0.04 (*)    All other components within normal limits  LACTIC ACID, PLASMA - Abnormal; Notable for the following:    Lactic Acid, Venous 2.3 (*)    All other components within normal limits  CULTURE, BLOOD (ROUTINE X 2)  CULTURE, BLOOD (ROUTINE X  2)  CBC  URINALYSIS COMPLETEWITH MICROSCOPIC ([facility] ONLY)  LACTIC ACID, PLASMA  CBG MONITORING, ED   ____________________________________________  EKG  ED ECG REPORT I, Joanne Gavel, the attending physician, personally viewed and interpreted this ECG.   Date: 05/27/2015  EKG Time: 17:21  Rate: 117  Rhythm: atrial fibrillation, rate 117, with rvr  Axis: normal  Intervals:none  ST&T Change: nonspecific ST abnromality  ____________________________________________  RADIOLOGY  CXR IMPRESSION: Hyperexpanded lungs without acute cardiopulmonary disease.  CT head  IMPRESSION: No acute intracranial pathology. Chronic changes are noted.  ____________________________________________   PROCEDURES  Procedure(s) performed: None  Critical Care performed: Yes, see critical care note(s). Total critical care time spent 35 minutes.  ____________________________________________   INITIAL IMPRESSION / ASSESSMENT AND PLAN / ED COURSE  Pertinent labs & imaging results that were available during my care of the patient were reviewed by me and considered in my medical decision making (see chart for details).   ----------------------------------------- 9:06 PM on 05/27/2015 -----------------------------------------   Claire Kelley is a 81 y.o. female with history of atrial fibrillation on Coumadin, hypertension, hypercholesterolemia, coronary artery disease status post CABG, status post pacemaker placement, history of AAA repair/stent resents for evaluation of altered mental status. On exam, she is mildly confused, requiring repetitive prompting, but she is  oriented 3 with an intact neurological exam. Mildly tachycardic on arrival and she has been tachypneic here with respiratory rate in the mid 20s. Lactate is elevated at 2.3. CXR clear. Urinalysis pending. Currently meeting 2 out of 4 Sirs criteria for which IV vancomycin IV Zosyn have been ordered. Liberal IV fluids ordered. Troponin elevated 0.04 concerning for possible NSTEMI. Aspirin ordered. Discussed with hospitalist for admission. ____________________________________________   FINAL CLINICAL IMPRESSION(S) / ED DIAGNOSES  Final diagnoses:  SIRS (systemic inflammatory response syndrome)  NSTEMI (non-ST elevated myocardial infarction)  Confusion      Joanne Gavel, MD 05/27/15 2107

## 2015-05-28 DIAGNOSIS — R4182 Altered mental status, unspecified: Secondary | ICD-10-CM | POA: Diagnosis not present

## 2015-05-28 LAB — CK: CK TOTAL: 182 U/L (ref 38–234)

## 2015-05-28 LAB — TROPONIN I
TROPONIN I: 0.03 ng/mL (ref <0.031)
TROPONIN I: 0.03 ng/mL (ref <0.031)

## 2015-05-28 LAB — LACTIC ACID, PLASMA
LACTIC ACID, VENOUS: 1.1 mmol/L (ref 0.5–2.0)
Lactic Acid, Venous: 2.7 mmol/L (ref 0.5–2.0)

## 2015-05-28 MED ORDER — ONDANSETRON HCL 4 MG/2ML IJ SOLN: 4.0000 mg | Freq: Four times a day (QID) | INTRAMUSCULAR | Status: DC | PRN

## 2015-05-28 MED ORDER — ACETAMINOPHEN 650 MG RE SUPP: 650.0000 mg | Freq: Four times a day (QID) | RECTAL | Status: DC | PRN

## 2015-05-28 MED ORDER — WARFARIN - PHYSICIAN DOSING INPATIENT
Freq: Every day | Status: DC
  Administered 2015-05-28: 18:00:00

## 2015-05-28 MED ORDER — SODIUM CHLORIDE 0.9 % IV SOLN
INTRAVENOUS | Status: DC
  Administered 2015-05-28 (×3): via INTRAVENOUS

## 2015-05-28 MED ORDER — DOCUSATE SODIUM 100 MG PO CAPS
100.0000 mg | ORAL_CAPSULE | Freq: Two times a day (BID) | ORAL | Status: DC
  Administered 2015-05-28 – 2015-05-29 (×4): 100 mg via ORAL
  Filled 2015-05-28 (×4): qty 1

## 2015-05-28 MED ORDER — ACETAMINOPHEN 325 MG PO TABS
650.0000 mg | ORAL_TABLET | Freq: Four times a day (QID) | ORAL | Status: DC | PRN
Start: 2015-05-28 — End: 2015-05-29

## 2015-05-28 MED ORDER — SENNOSIDES-DOCUSATE SODIUM 8.6-50 MG PO TABS
1.0000 | ORAL_TABLET | Freq: Every evening | ORAL | Status: DC | PRN
  Administered 2015-05-28: 1 via ORAL
  Filled 2015-05-28: qty 1

## 2015-05-28 MED ORDER — DILTIAZEM HCL 30 MG PO TABS
30.0000 mg | ORAL_TABLET | Freq: Four times a day (QID) | ORAL | Status: DC
  Administered 2015-05-28 – 2015-05-29 (×4): 30 mg via ORAL
  Filled 2015-05-28 (×4): qty 1

## 2015-05-28 MED ORDER — ONDANSETRON HCL 4 MG PO TABS: 4.0000 mg | ORAL_TABLET | Freq: Four times a day (QID) | ORAL | Status: DC | PRN

## 2015-05-28 NOTE — Progress Notes (Signed)
PT Cancellation Note  Patient Details Name: Claire Kelley MRN: [id number] DOB: 1926-04-14   Cancelled Treatment:    Reason Eval/Treat Not Completed: Patient not medically ready (Per ED note "Troponin elevated 0.04 concerning for possible NSTEMI."  )  Pt discussed with nursing.  Will hold PT until pt is determined to be medically appropriate to participate in physical therapy.   Raquel Sarna Ainslee Sou 05/28/2015, 11:49 AM Leitha Bleak, [place]

## 2015-05-28 NOTE — Evaluation (Signed)
Physical Therapy Evaluation Patient Details Name: Claire Kelley MRN: [id number] DOB: June 11, 1926 Today's Date: 05/28/2015   History of Present Illness  Pt is an 81 y.o. female admitted with increased weakness, AMS, and pt was tachy in the 110's in ER.  Troponin downtrending.  PMH:  chronic afib on coumadin, htn, CKD, pacemaker, AAA, CABG  Clinical Impression  Currently pt demonstrates impairments with balance and limitations with functional mobility.  Prior to admission, pt was independent occasionally using quad cane if she felt unsteady.  Pt lives alone and can reside on 1 level with 4 steps to enter with railing.  Currently pt is CGA to mod assist (no AD) for functional mobility.  Pt was initially unsteady ambulating but improved with distance; upon returning to pt's room pt became very unsteady (requiring mod assist to maintain upright) and with decreased responsiveness (pt not verbalizing and very slow to respond when she finally did); pt assisted into chair and nursing staff came immediately to assess pt (pt then assisted back to bed and reporting feeling better).  Pt would benefit from skilled PT to address above noted impairments and functional limitations.  Recommend pt discharge to home when medically appropriate; d/t pt's current status recommend 24/hour supervision/assist but anticipate once pt's medical issues have improved, pt's mobility will improve and will require less help at home.     Follow Up Recommendations Home health PT;Supervision/Assistance - 24 hour (pending pt's progress)    Equipment Recommendations   (To be determined)    Recommendations for Other Services       Precautions / Restrictions Precautions Precautions: Fall Precaution Comments: Telemetry Restrictions Weight Bearing Restrictions: No      Mobility  Bed Mobility Overal bed mobility: Needs Assistance Bed Mobility: Supine to Sit;Sit to Supine     Supine to sit: Supervision;HOB elevated Sit to  supine: Supervision;HOB elevated   General bed mobility comments: assist for safety and IV line  Transfers Overall transfer level: Needs assistance Equipment used: None Transfers: Sit to/from Stand Sit to Stand: Min guard;Min assist         General transfer comment: Pt initially CGA but after requiring sitting rest break in chair required min assist to stand  Ambulation/Gait Ambulation/Gait assistance: Min guard;Min assist;Mod assist Ambulation Distance (Feet): 190 Feet Assistive device: None;1 person hand held assist       General Gait Details: Pt initially holding onto IV pole for 60 feet, then no AD for 120 feet, then hand hold assist walking back to bed; pt initially unsteady but improved with distance until coming back into pt's room where pt had episode where she was very unsteady requiring mod assist to remain standing (pt assisted into chair for safety); mod assist to steady walking back to bed required (hand hold assist)  Stairs            Wheelchair Mobility    Modified Rankin (Stroke Patients Only)       Balance Overall balance assessment: Needs assistance                                           Pertinent Vitals/Pain Pain Assessment: No/denies pain  Pt's HR fluctuating in the 70's (O2 98% on room air) prior to ambulation and after episode of unsteadiness and decreased responsiveness pt's HR 99 bpm, O2 97% on room air and BP 162/90.    Home Living Family/patient  expects to be discharged to:: Private residence Living Arrangements: Alone Available Help at Discharge: Family Type of Home: House Home Access: Stairs to enter Entrance Stairs-Rails: Right Entrance Stairs-Number of Steps: 4 Home Layout: Able to live on main level with bedroom/bathroom (bonus room 2nd floor) Home Equipment: Cane - quad      Prior Function Level of Independence: Independent with assistive device(s)         Comments: Pt occasionally using quad cane if  felt unsteady     Hand Dominance        Extremity/Trunk Assessment   Upper Extremity Assessment: Generalized weakness           Lower Extremity Assessment: Generalized weakness         Communication   Communication: Expressive difficulties (Increased time to process with some word finding difficulties)  Cognition Arousal/Alertness: Awake/alert Behavior During Therapy: WFL for tasks assessed/performed Overall Cognitive Status: Impaired/Different from baseline Area of Impairment: Memory                    General Comments  Nursing cleared pt for participation in physical therapy.  Pt's troponin's down-trending.  Pt agreeable to PT eval and pt's 2 daughter's present for session.    Exercises        Assessment/Plan    PT Assessment Patient needs continued PT services  PT Diagnosis Difficulty walking;Generalized weakness   PT Problem List Decreased strength;Decreased activity tolerance;Decreased mobility;Decreased balance  PT Treatment Interventions DME instruction;Gait training;Stair training;Functional mobility training;Therapeutic activities;Therapeutic exercise;Balance training;Patient/family education   PT Goals (Current goals can be found in the Care Plan section) Acute Rehab PT Goals Patient Stated Goal: To go home PT Goal Formulation: With patient Time For Goal Achievement: 06/11/15 Potential to Achieve Goals: Good    Frequency Min 2X/week   Barriers to discharge        Co-evaluation               End of Session Equipment Utilized During Treatment: Gait belt Activity Tolerance:  (Pt appeared to tolerate treatment well until returning to room (pt became very unsteady and with decrease responsiveness)) Patient left: in bed;with call bell/phone within reach;with bed alarm set;with nursing/sitter in room;with family/visitor present Nurse Communication: Mobility status;Other (comment) (Description of increased unsteadiness and decrease  responsiveness upon returning to room)    Functional Assessment Tool Used: AM-PAC without stairs Functional Limitation: Mobility: Walking and moving around Mobility: Walking and Moving Around Current Status [id number]): At least 40 percent but less than 60 percent impaired, limited or restricted Mobility: Walking and Moving Around Goal Status [phone]): At least 1 percent but less than 20 percent impaired, limited or restricted    Time: [id number] PT Time Calculation (min) (ACUTE ONLY): 27 min   Charges:   PT Evaluation $Initial PT Evaluation Tier I: 1 Procedure     PT G Codes:   PT G-Codes **NOT FOR INPATIENT CLASS** Functional Assessment Tool Used: AM-PAC without stairs Functional Limitation: Mobility: Walking and moving around Mobility: Walking and Moving Around Current Status [id number]): At least 40 percent but less than 60 percent impaired, limited or restricted Mobility: Walking and Moving Around Goal Status [phone]): At least 1 percent but less than 20 percent impaired, limited or restricted    [facility] 05/28/2015, 4:08 PM Leitha Bleak, [place]

## 2015-05-28 NOTE — Progress Notes (Signed)
Notified MD of pt family request to slow down iv fluids r/t frequent urination. IV fluids decreased to 60/hr

## 2015-05-28 NOTE — Care Management Note (Signed)
Case Management Note  Patient Details  Name: Claire Kelley MRN: [id number] Date of Birth: [birth year]  Subjective/Objective:                  Spoke with patient and her daughter Claire Kelley. Patient has been been admitted in [place] which will not cover SNF but it will cover home health if medically necessary. She has a 4-pronged cane but no other DME. She drives and has been independent with daily living until recent illness. Her PCP is in [place] ?Dr. Sharol Given. Her daughter Claire Kelley provides transportation to [facility] appointments. She states she is able to afford her Rx.    Action/Plan: RNCM will continue to follow  Expected Discharge Date:                  Expected Discharge Plan:     In-House Referral:     Discharge planning Services  CM Consult  Post Acute Care Choice:    Choice offered to:  Patient, Adult Children  DME Arranged:    DME Agency:     HH Arranged:    [place] Agency:     Status of Service:  In process, will continue to follow  Medicare Important Message Given:    Date Medicare IM Given:    Medicare IM give by:    Date Additional Medicare IM Given:    Additional Medicare Important Message give by:     If discussed at [place] of Stay Meetings, dates discussed:    Additional Comments:  Marshell Garfinkel, RN 05/28/2015, 10:43 AM

## 2015-05-28 NOTE — Progress Notes (Signed)
Notified Dr Tressia Miners of pt HR in 130's. Dr acknowledged, ordered to give morning metoprolol and continue to monitor. Dr stated she would evaluate and possibly add medications

## 2015-05-28 NOTE — Progress Notes (Signed)
ANTICOAGULATION CONSULT NOTE - Initial Consult  Pharmacy Consult for Coumadin  Indication: atrial fibrillation  Allergies  Allergen Reactions  . Bactrim [Sulfamethoxazole-Trimethoprim] Other (See Comments)    Reaction:  Unknown   . Ciprocinonide [Fluocinolone] Other (See Comments)    Reaction:  Unknown   . Lipitor [Atorvastatin] Other (See Comments)    Reaction:  Leg cramps   . Macrobid [employer] Macro] Other (See Comments)    Reaction:  Unknown   . Paxil [Paroxetine Hcl] Other (See Comments)    Reaction:  Made pt "loopy"    Patient Measurements: Height: 5\' 8"  (172.7 cm) Weight: 152 lb 6.4 oz (69.128 kg) IBW/kg (Calculated) : 63.9 Heparin Dosing Weight: n/a  Vital Signs: Temp: 97.8 F (36.6 C) (06/09 0816) Temp Source: Oral (06/09 0816) BP: 153/98 mmHg (06/09 0816) Pulse Rate: 133 (06/09 0816)  Labs:  Recent Labs  05/27/15 1721  HGB 13.9  HCT 42.8  PLT 212  LABPROT 17.8*  INR 1.45  CREATININE 1.78*  TROPONINI 0.04*    Estimated Creatinine Clearance: 22.5 mL/min (by C-G formula based on Cr of 1.78).   Medical History: Past Medical History  Diagnosis Date  . A-fib   . Hypertension   . GERD (gastroesophageal reflux disease)   . Renal disorder     CKD  . High cholesterol     Medications:  Prescriptions prior to admission  Medication Sig Dispense Refill Last Dose  . acetaminophen (TYLENOL) 650 MG CR tablet Take 1,300 mg by mouth every 8 (eight) hours as needed for pain.   Past Week at Unknown time  . amLODipine (NORVASC) 2.5 MG tablet Take 2.5 mg by mouth daily.   Past Week at Unknown time  . aspirin EC 81 MG tablet Take 81 mg by mouth daily.   Past Week at Unknown time  . baclofen (LIORESAL) 10 MG tablet Take 5 mg by mouth 3 (three) times daily as needed for muscle spasms.   Past Week at Unknown time  . Cholecalciferol (VITAMIN D3) 1000 UNITS CAPS Take 1 capsule by mouth daily.   Past Week at Unknown time  . clotrimazole-betamethasone  (LOTRISONE) cream Apply 1 application topically 2 (two) times daily as needed (for irritation).   Past Week at Unknown time  . CRANBERRY-VITAMIN C PO Take 1 capsule by mouth 2 (two) times daily.   Past Week at Unknown time  . Iron-Vitamin C (VITRON-C PO) Take 1 tablet by mouth daily.   Past Week at Unknown time  . metoprolol succinate (TOPROL-XL) 100 MG 24 hr tablet Take 100 mg by mouth daily.   Past Week at Unknown time  . oxybutynin (DITROPAN) 5 MG tablet Take 5 mg by mouth daily.   Past Week at Unknown time  . pantoprazole (PROTONIX) 40 MG tablet Take 40 mg by mouth daily.   Past Week at Unknown time  . rosuvastatin (CRESTOR) 5 MG tablet Take 5 mg by mouth daily.   Past Week at Unknown time  . warfarin (COUMADIN) 1 MG tablet Take 1-2 mg by mouth daily. Pt takes 2mg  on Wednesday and 1mg  all other days.   Past Week at Unknown time    Assessment: Patient takes Coumadin 1mg  daily except on Wednesday she takes 2mg . Admitted for altered mental status, INR on admission was 1.45. Noted that patient had not been taking her meds for past few days which may explain the subtherapeutic INR.  Goal of Therapy:  INR 2-3 Monitor platelets by anticoagulation protocol: Yes   Plan:  Will continue with Coumadin 2mg  for todays dose and recheck INR with am labs.  Paulina Fusi, PharmD, BCPS 05/28/2015 1:39 PM

## 2015-05-28 NOTE — Progress Notes (Signed)
Dr. Posey Pronto notified of elevated lactic acid level - 2.7. IV fluids increased, Repeat to be drawn at 8am. Pt currently resting in bed. A&O x 4. Will continue to monitor.

## 2015-05-28 NOTE — Progress Notes (Signed)
Notified Dr Tressia Miners that pt had episode while working with physical therapy where she became verbally unresponsive. Pt never lost consciousness. Phys therapy activity was stopped and pt assisted to sit down on chair. VS taken and were WDL; pt neuro checks WDL following episode. Dr acknowledged, and ordered carotid dopplers.

## 2015-05-28 NOTE — Progress Notes (Signed)
[place] [facility] [place] NAME: Claire Kelley    MR#:  [id number]  DATE OF BIRTH:  [birth year]  SUBJECTIVE:  CHIEF COMPLAINT:   Chief Complaint  Patient presents with  . Altered Mental Status   - Patient noted to be confused at home did not take her medications for 2 days which is abnormal for the patient. -Noted to be in A. fib with rapid ventricular rate today. Elevated lactic acid.  REVIEW OF SYSTEMS:  Review of Systems  Constitutional: Negative for fever and chills.  Respiratory: Negative for cough, shortness of breath and wheezing.   Cardiovascular: Positive for palpitations. Negative for chest pain.  Gastrointestinal: Negative for nausea, vomiting, abdominal pain, diarrhea and constipation.  Genitourinary: Negative for dysuria.  Neurological: Positive for weakness. Negative for dizziness, seizures and headaches.    DRUG ALLERGIES:   Allergies  Allergen Reactions  . Bactrim [Sulfamethoxazole-Trimethoprim] Other (See Comments)    Reaction:  Unknown   . Ciprocinonide [Fluocinolone] Other (See Comments)    Reaction:  Unknown   . Lipitor [Atorvastatin] Other (See Comments)    Reaction:  Leg cramps   . Macrobid [employer] Macro] Other (See Comments)    Reaction:  Unknown   . Paxil [Paroxetine Hcl] Other (See Comments)    Reaction:  Made pt "loopy"    VITALS:  Blood pressure 153/98, pulse 133, temperature 97.8 F (36.6 C), temperature source Oral, resp. rate 18, height 5\' 8"  (1.727 m), weight 69.128 kg (152 lb 6.4 oz), SpO2 96 %.  PHYSICAL EXAMINATION:  Physical Exam  GENERAL:  81 y.o.-year-old patient lying in the bed with no acute distress.  EYES: Pupils equal, round, reactive to light and accommodation. No scleral icterus. Extraocular muscles intact.  HEENT: Head atraumatic, normocephalic. Oropharynx and nasopharynx clear.  NECK:  Supple, no jugular venous distention. No thyroid enlargement, no tenderness.   LUNGS: Normal breath sounds bilaterally, no wheezing, rales,rhonchi or crepitation. No use of accessory muscles of respiration.  CARDIOVASCULAR: S1, S2 normal. 3/6 systolic murmur present, No rubs, or gallops.  Pacemaker in place. ABDOMEN: Soft, nontender, nondistended. Bowel sounds present. No organomegaly or mass.  EXTREMITIES: No pedal edema, cyanosis, or clubbing.  NEUROLOGIC: Cranial nerves II through XII are intact. Muscle strength 5/5 in all extremities. Sensation intact. Gait not checked.  PSYCHIATRIC: The patient is alert and oriented x 3.  SKIN: No obvious rash, lesion, or ulcer.    LABORATORY PANEL:   CBC  Recent Labs Lab 05/27/15 1721  WBC 11.0  HGB 13.9  HCT 42.8  PLT 212   ------------------------------------------------------------------------------------------------------------------  Chemistries   Recent Labs Lab 05/27/15 1721  NA 143  K 4.7  CL 106  CO2 24  GLUCOSE 111*  BUN 31*  CREATININE 1.78*  CALCIUM 9.6  AST 26  ALT 18  ALKPHOS 90  BILITOT 1.1   ------------------------------------------------------------------------------------------------------------------  Cardiac Enzymes  Recent Labs Lab 05/27/15 1721  TROPONINI 0.04*   ------------------------------------------------------------------------------------------------------------------  RADIOLOGY:  Dg Chest 2 View  05/27/2015   CLINICAL DATA:  Nervousness.  Visual disturbance.  EXAM: CHEST  2 VIEW  COMPARISON:  None.  FINDINGS: Normal cardiac silhouette and mediastinal contours. Atherosclerotic plaque within the thoracic aorta. Post median sternotomy and CABG. Left anterior chest wall dual lead pacemaker tips overlie the expected location of the right atrium and ventricle. The lungs appear mildly hyperexpanded. There is mild elevation / eventration of the anterior aspect the right hemidiaphragm. No focal airspace opacities. No pleural effusion  or pneumothorax. No evidence of edema. No  acute osseus abnormalities. Moderate scoliotic curvature of the thoracolumbar spine. Post endovascular repair of the abdominal aorta, incompletely evaluated.  IMPRESSION: Hyperexpanded lungs without acute cardiopulmonary disease.   Electronically Signed   By: Sandi Mariscal M.D.   On: 05/27/2015 19:25   Ct Head Wo Contrast  05/27/2015   CLINICAL DATA:  Confusion today  EXAM: CT HEAD WITHOUT CONTRAST  TECHNIQUE: Contiguous axial images were obtained from the base of the skull through the vertex without intravenous contrast.  COMPARISON:  None.  FINDINGS: Moderate global atrophy. Chronic ischemic changes in the periventricular white matter. No mass effect, midline shift, or acute intracranial hemorrhage. Mastoid air cells are clear. Mucosal 6 vein throughout the paranasal sinuses is present.  IMPRESSION: No acute intracranial pathology.  Chronic changes are noted.   Electronically Signed   By: Marybelle Killings M.D.   On: 05/27/2015 18:31    EKG:   Orders placed or performed during the hospital encounter of 05/27/15  . ED EKG  . ED EKG  . ED EKG  . ED EKG  . EKG 12-Lead  . EKG 12-Lead    ASSESSMENT AND PLAN:   81 year old female with past medical history significant for chronic atrial fibrillation on Coumadin, hypertension 6 sinus syndrome with pacemaker, reflux disease presented to the hospital secondary to confusion and weakness.  #1 acute confusion-likely metabolic encephalopathy. -Mild cystitis noted on urinary analysis. -Could also be from the baclofen that she got prescribed from the emergency room for her back pain. Discontinue baclofen. Neuro checks for now. Continue to monitor at this time. Improving clinically. -CT head with no acute intracranial pathology noted.  #2 A. fib with RVR-heart rate is elevated, patient did not take her Toprol for the last couple of days. Continue Toprol 100 mg daily -Added Cardizem 30 mg every 6 hours. Patient on Coumadin for anticoagulation. INR is  subtherapeutic. Pharmacy consult to adjust Coumadin dose.  #3 back pain-from pulled muscle. Discontinue baclofen. Patient is pain-free at this time. Physical therapy consult. Check CPK  #4 elevated troponin-likely demand ischemia from elevated heart rate. Less likely to be myocardial infarction. Recycle troponins. No contraindication to physical therapy at this point. Patient does not have any chest pain, no EKG changes.  #5 elevated lactic acid-likely from UTI. No sepsis noted. Normalized this morning.  #6 DVT prophylaxis-on Coumadin.  Discussed with daughter at bedside. Physical therapy consult pending.  All the records are reviewed and case discussed with Care Management/Social Workerr. Management plans discussed with the patient, family and they are in agreement.  CODE STATUS: Full Code  TOTAL TIME TAKING CARE OF THIS PATIENT: 37 minutes.   POSSIBLE D/C IN 1-2 DAYS, DEPENDING ON CLINICAL CONDITION.   Rether Rison M.D on 05/28/2015 at 11:59 AM  Between 7am to 6pm - Pager - [phone]  After 6pm go to [email or web address] - password [email or web address] [facility]  [place] [facility]  Office  [phone]  CC: Primary care physician; No primary care provider on file.

## 2015-05-29 ENCOUNTER — Observation Stay: Payer: Medicare Other

## 2015-05-29 ENCOUNTER — Observation Stay (HOSPITAL_COMMUNITY): Payer: Medicare Other

## 2015-05-29 DIAGNOSIS — N3000 Acute cystitis without hematuria: Secondary | ICD-10-CM

## 2015-05-29 DIAGNOSIS — R4182 Altered mental status, unspecified: Secondary | ICD-10-CM | POA: Diagnosis not present

## 2015-05-29 LAB — CBC
HEMATOCRIT: 35.9 % (ref 35.0–47.0)
Hemoglobin: 11.7 g/dL — ABNORMAL LOW (ref 12.0–16.0)
MCH: 32 pg (ref 26.0–34.0)
MCHC: 32.6 g/dL (ref 32.0–36.0)
MCV: 98.2 fL (ref 80.0–100.0)
PLATELETS: 167 10*3/uL (ref 150–440)
RBC: 3.66 MIL/uL — AB (ref 3.80–5.20)
RDW: 13.7 % (ref 11.5–14.5)
WBC: 7.5 10*3/uL (ref 3.6–11.0)

## 2015-05-29 LAB — PROTIME-INR
INR: 1.55
Prothrombin Time: 18.8 seconds — ABNORMAL HIGH (ref 11.4–15.0)

## 2015-05-29 LAB — BASIC METABOLIC PANEL
ANION GAP: 7 (ref 5–15)
BUN: 20 mg/dL (ref 6–20)
CO2: 25 mmol/L (ref 22–32)
Calcium: 8.7 mg/dL — ABNORMAL LOW (ref 8.9–10.3)
Chloride: 110 mmol/L (ref 101–111)
Creatinine, Ser: 1.18 mg/dL — ABNORMAL HIGH (ref 0.44–1.00)
GFR calc Af Amer: 47 mL/min — ABNORMAL LOW (ref >60)
GFR, EST NON AFRICAN AMERICAN: 40 mL/min — AB (ref >60)
Glucose, Bld: 113 mg/dL — ABNORMAL HIGH (ref 65–99)
POTASSIUM: 3.8 mmol/L (ref 3.5–5.1)
Sodium: 142 mmol/L (ref 135–145)

## 2015-05-29 MED ORDER — WARFARIN SODIUM 2 MG PO TABS: 2.0000 mg | ORAL_TABLET | Freq: Every day | ORAL | Status: DC

## 2015-05-29 MED ORDER — CEPHALEXIN 250 MG PO CAPS
250.0000 mg | ORAL_CAPSULE | Freq: Three times a day (TID) | ORAL | Status: DC
Start: 2015-05-29 — End: 2016-04-20

## 2015-05-29 NOTE — Progress Notes (Signed)
Physical Therapy Treatment Patient Details Name: Claire Kelley MRN: [id number] DOB: 1925/12/22 Today's Date: 05/29/2015    History of Present Illness Pt is an 81 y.o. female admitted with increased weakness, AMS, and pt was tachy in the 110's in ER.  Troponin downtrending.  PMH:  chronic afib on coumadin, htn, CKD, pacemaker, AAA, CABG    PT Comments    Pt continues to be unsteady ambulating without AD and also required assist to steady with use of SBQC.  Trialed RW and pt steady without loss of balance.  Currently pt appears to be safest ambulating with RW (recommend pt use RW at home).  Recommended to pt's family to have 24/7 assist initially for safety at home d/t being pt's first time using RW; also recommended HHPT (pt and pt's family agreeable to this).  Follow Up Recommendations  Home health PT;Supervision/Assistance - 24 hour (Pt and pt's daughter's agreeable to this)     Equipment Recommendations  Rolling walker with 5" wheels (care management notified)    Recommendations for Other Services       Precautions / Restrictions Precautions Precautions: Fall Precaution Comments: Telemetry Restrictions Weight Bearing Restrictions: No    Mobility  Bed Mobility Overal bed mobility: Modified Independent                Transfers Overall transfer level: Needs assistance Equipment used: Rolling walker (2 wheeled);Quad cane;None Transfers: Sit to/from [employer] to Stand:  (SBA with RW; CGA to min assist with no AD and SBQC) Stand pivot transfers:  (SBA with RW; CGA to min assist with no AD and SBQC)       General transfer comment: initial vc's required for transfer technique with RW  Ambulation/Gait Ambulation/Gait assistance: Supervision;Min guard;Min assist Ambulation Distance (Feet):  (190 feet no AD; 110 feet with SBQC; 190 feet with RW) Assistive device: Rolling walker (2 wheeled);Quad cane;None       General Gait Details: Pt unsteady  with no AD requiring min assist to steady throughout ambulation; pt CGA to occassional min assist to steady with SBQC; pt initially CGA then SBA with RW use (steady gait with RW)   Stairs Stairs:  (Pt's family declined and reported they would assist pt in/out of home)          Wheelchair Mobility    Modified Rankin (Stroke Patients Only)       Balance                                    Cognition Arousal/Alertness: Awake/alert Behavior During Therapy: WFL for tasks assessed/performed Overall Cognitive Status: Impaired/Different from baseline Area of Impairment: Memory                    Exercises      General Comments  Nursing cleared pt for participation in physical therapy.  Pt agreeable to PT session.  Pt's daughter's present throughout session.      Pertinent Vitals/Pain Pain Assessment: No/denies pain  Pt in a-fib (chronic); at rest HR 80-90 bpm; with activity HR 103-114 bpm.  O2 >93% during session on room air.    Home Living                      Prior Function            PT Goals (current goals can now be found in the care plan  section) Acute Rehab PT Goals Patient Stated Goal: To go home PT Goal Formulation: With patient Time For Goal Achievement: 06/11/15 Potential to Achieve Goals: Good Additional Goals Additional Goal #1: Pt will score >24/28 on [place] balance assessment Progress towards PT goals: Progressing toward goals    Frequency  Min 2X/week    PT Plan Current plan remains appropriate    Co-evaluation             End of Session Equipment Utilized During Treatment: Gait belt Activity Tolerance: Patient tolerated treatment well Patient left: in bed;with call bell/phone within reach;with bed alarm set;with family/visitor present     Time: 1057-1130 PT Time Calculation (min) (ACUTE ONLY): 33 min  Charges:  $Gait Training: 23-37 mins                    G CodesLeitha Bleak Jun 11, 2015,  11:52 AM Leitha Bleak, [place]

## 2015-05-29 NOTE — Progress Notes (Signed)
[place] at [place] NAME: Claire Kelley    MR#:  [id number]  DATE OF BIRTH:  01-20-1926  SUBJECTIVE:  CHIEF COMPLAINT:   Chief Complaint  Patient presents with  . Altered Mental Status   - had an episode of near syncope working with physical therapy yesterday. - doing well today, more alert. So, it could be from baclofen medicine that she has taken as outpatient - for discharge today   REVIEW OF SYSTEMS:  Review of Systems  Constitutional: Negative for fever and chills.  Respiratory: Negative for cough, shortness of breath and wheezing.   Cardiovascular: Negative for chest pain and palpitations.  Gastrointestinal: Negative for nausea, vomiting, abdominal pain, diarrhea and constipation.  Genitourinary: Negative for dysuria.  Neurological: Negative for dizziness, seizures, weakness and headaches.    DRUG ALLERGIES:   Allergies  Allergen Reactions  . Bactrim [Sulfamethoxazole-Trimethoprim] Other (See Comments)    Reaction:  Unknown   . Ciprocinonide [Fluocinolone] Other (See Comments)    Reaction:  Unknown   . Lipitor [Atorvastatin] Other (See Comments)    Reaction:  Leg cramps   . Macrobid [employer] Macro] Other (See Comments)    Reaction:  Unknown   . Paxil [Paroxetine Hcl] Other (See Comments)    Reaction:  Made pt "loopy"    VITALS:  Blood pressure 126/69, pulse 70, temperature 98.1 F (36.7 C), temperature source Oral, resp. rate 20, height 5\' 8"  (1.727 m), weight 69.128 kg (152 lb 6.4 oz), SpO2 98 %.  PHYSICAL EXAMINATION:  Physical Exam  GENERAL:  82 y.o.-year-old patient lying in the bed with no acute distress.  EYES: Pupils equal, round, reactive to light and accommodation. No scleral icterus. Extraocular muscles intact.  HEENT: Head atraumatic, normocephalic. Oropharynx and nasopharynx clear.  NECK:  Supple, no jugular venous distention. No thyroid enlargement, no tenderness.  LUNGS: Normal  breath sounds bilaterally, no wheezing, rales,rhonchi or crepitation. No use of accessory muscles of respiration.  CARDIOVASCULAR: S1, S2 normal. 3/6 systolic murmur present, No rubs, or gallops.  Pacemaker in place. ABDOMEN: Soft, nontender, nondistended. Bowel sounds present. No organomegaly or mass.  EXTREMITIES: No pedal edema, cyanosis, or clubbing.  NEUROLOGIC: Cranial nerves II through XII are intact. Muscle strength 5/5 in all extremities. Sensation intact. Gait not checked.  PSYCHIATRIC: The patient is alert and oriented x 3.  SKIN: No obvious rash, lesion, or ulcer.    LABORATORY PANEL:   CBC  Recent Labs Lab 05/29/15 0659  WBC 7.5  HGB 11.7*  HCT 35.9  PLT 167   ------------------------------------------------------------------------------------------------------------------  Chemistries   Recent Labs Lab 05/27/15 1721 05/29/15 0659  NA 143 142  K 4.7 3.8  CL 106 110  CO2 24 25  GLUCOSE 111* 113*  BUN 31* 20  CREATININE 1.78* 1.18*  CALCIUM 9.6 8.7*  AST 26  --   ALT 18  --   ALKPHOS 90  --   BILITOT 1.1  --    ------------------------------------------------------------------------------------------------------------------  Cardiac Enzymes  Recent Labs Lab 05/28/15 1559  TROPONINI 0.03   ------------------------------------------------------------------------------------------------------------------  RADIOLOGY:  Dg Chest 2 View  05/27/2015   CLINICAL DATA:  Nervousness.  Visual disturbance.  EXAM: CHEST  2 VIEW  COMPARISON:  None.  FINDINGS: Normal cardiac silhouette and mediastinal contours. Atherosclerotic plaque within the thoracic aorta. Post median sternotomy and CABG. Left anterior chest wall dual lead pacemaker tips overlie the expected location of the right atrium and ventricle. The lungs appear mildly hyperexpanded. There  is mild elevation / eventration of the anterior aspect the right hemidiaphragm. No focal airspace opacities. No pleural  effusion or pneumothorax. No evidence of edema. No acute osseus abnormalities. Moderate scoliotic curvature of the thoracolumbar spine. Post endovascular repair of the abdominal aorta, incompletely evaluated.  IMPRESSION: Hyperexpanded lungs without acute cardiopulmonary disease.   Electronically Signed   By: Sandi Mariscal M.D.   On: 05/27/2015 19:25   Ct Head Wo Contrast  05/27/2015   CLINICAL DATA:  Confusion today  EXAM: CT HEAD WITHOUT CONTRAST  TECHNIQUE: Contiguous axial images were obtained from the base of the skull through the vertex without intravenous contrast.  COMPARISON:  None.  FINDINGS: Moderate global atrophy. Chronic ischemic changes in the periventricular white matter. No mass effect, midline shift, or acute intracranial hemorrhage. Mastoid air cells are clear. Mucosal 6 vein throughout the paranasal sinuses is present.  IMPRESSION: No acute intracranial pathology.  Chronic changes are noted.   Electronically Signed   By: Marybelle Killings M.D.   On: 05/27/2015 18:31   US Carotid Bilateral  05/29/2015   CLINICAL DATA:  Syncopal episodes, hypertension, dizziness  EXAM: BILATERAL CAROTID DUPLEX ULTRASOUND  TECHNIQUE: Pearline Cables scale imaging, color Doppler and duplex ultrasound was performed of bilateral carotid and vertebral arteries in the neck.  COMPARISON:  None.  REVIEW OF SYSTEMS: Quantification of carotid stenosis is based on velocity parameters that correlate the residual internal carotid diameter with NASCET-based stenosis levels, using the diameter of the distal internal carotid lumen as the denominator for stenosis measurement.  The following velocity measurements were obtained:  PEAK SYSTOLIC/END DIASTOLIC  RIGHT  ICA:                     80/15cm/sec  CCA:                     99991111  SYSTOLIC ICA/CCA RATIO:  0.7  DIASTOLIC ICA/CCA RATIO: 0.7  ECA:                     79cm/sec  LEFT  ICA:                     150/22cm/sec  CCA:                     99991111  SYSTOLIC ICA/CCA RATIO:  1.6   DIASTOLIC ICA/CCA RATIO: 1.8  ECA:                     84cm/sec  FINDINGS: RIGHT CAROTID ARTERY: Mild partially calcified plaque at the carotid bifurcation involving origins of internal and external carotid arteries, without high-grade stenosis. Normal waveforms and color Doppler signal.  RIGHT VERTEBRAL ARTERY:  Normal flow direction and waveform.  LEFT CAROTID ARTERY: Mild eccentric calcified plaque in the bulb and proximal ICA without high-grade stenosis. Normal waveforms and color Doppler signal.  LEFT VERTEBRAL ARTERY: Normal flow direction and waveform. A nonspecific cardiac arrhythmia is noted.  IMPRESSION: 1. Bilateral proximal ICA plaque left greater than right, resulting in less than 50% diameter stenosis. The exam does not exclude plaque ulceration or embolization. Continued surveillance recommended. 2. Nonspecific cardiac arrhythmia.  Correlate with EKG.   Electronically Signed   By: Lucrezia Europe M.D.   On: 05/29/2015 10:44    EKG:   Orders placed or performed during the hospital encounter of 05/27/15  . ED EKG  . ED EKG  . ED EKG  .  ED EKG  . EKG 12-Lead  . EKG 12-Lead    ASSESSMENT AND PLAN:   81 year old female with past medical history significant for chronic atrial fibrillation on Coumadin, hypertension 6 sinus syndrome with pacemaker, reflux disease presented to the hospital secondary to confusion and weakness.  #1 acute confusion-likely metabolic encephalopathy. -Mild cystitis noted on urinary analysis. -Could also be from the baclofen that she got prescribed from the emergency room for her back pain. Discontinue baclofen. Neuro checks for now. Continue to monitor at this time. Asked to baseline at this time-CT head with no acute intracranial pathology noted.  #2 A. fib with RVR-heart rate is elevated, patient did not take her Toprol for the last couple of days. Continue Toprol 100 mg daily -Added Cardizem 30 mg every 6 hours. Might not need Cardizem at discharge. Patient  on Coumadin for anticoagulation. INR is subtherapeutic. Coumadin dose will be increased at discharge   #3 back pain-from pulled muscle. Discontinue baclofen. Patient is pain-free at this time. Physical therapy consult. Normal CPK  #4 elevated troponin-likely demand ischemia from elevated heart rate. Less likely to be myocardial infarction. Recycle troponins. No contraindication to physical therapy at this point. Patient does not have any chest pain, no EKG changes.  #5 elevated lactic acid-likely from UTI. No sepsis noted. Normalized later. Discharge on oral antibiotics  #6 DVT prophylaxis-on Coumadin.  All the records are reviewed and case discussed with Care Management/Social Workerr. Management plans discussed with the patient, family and they are in agreement.  CODE STATUS: Full Code  TOTAL TIME TAKING CARE OF THIS PATIENT: 37 minutes.   POSSIBLE D/C TODAY, DEPENDING ON CLINICAL CONDITION.   Gladstone Lighter M.D on 05/29/2015 at 10:50 AM  Between 7am to 6pm - Pager - [phone]  After 6pm go to [email or web address] - password [email or web address] [facility]  [facility]  Office  [phone]  CC: Primary care physician; No primary care provider on file.

## 2015-05-29 NOTE — Discharge Instructions (Signed)
°  DIET:  °Cardiac diet ° °DISCHARGE CONDITION:  °Stable ° °ACTIVITY:  °Activity as tolerated ° °OXYGEN:  °Home Oxygen: No. °  °Oxygen Delivery: room air ° °DISCHARGE LOCATION:  °Home with home health  ° °If you experience worsening of your admission symptoms, develop shortness of breath, life threatening emergency, suicidal or homicidal thoughts you must seek medical attention immediately by calling 911 or calling your MD immediately  if symptoms less severe. ° °You Must read complete instructions/literature along with all the possible adverse reactions/side effects for all the Medicines you take and that have been prescribed to you. Take any new Medicines after you have completely understood and accpet all the possible adverse reactions/side effects.  ° °Please note ° °You were cared for by a hospitalist during your hospital stay. If you have any questions about your discharge medications or the care you received while you were in the hospital after you are discharged, you can call the unit and asked to speak with the hospitalist on call if the hospitalist that took care of you is not available. Once you are discharged, your primary care physician will handle any further medical issues. Please note that NO REFILLS for any discharge medications will be authorized once you are discharged, as it is imperative that you return to your primary care physician (or establish a relationship with a primary care physician if you do not have one) for your aftercare needs so that they can reassess your need for medications and monitor your lab values. ° ° °

## 2015-05-29 NOTE — Care Management (Addendum)
Spoke again with patient. She denies need for home health or 24/7 assistance. I asked that she speak with her daughter Claire Kelley today regarding home health and assistance. She denies need for rolling walker. RNCM will follow up with patient. Patient MEDICARE OBS on 05/27/15. Met with patient, Claire Kelley and another daughter to discuss home health agencies. List of 24/7 caregivers delivered to room. Patient would like to use [facility] for next day services as promised and requests a CNA. MD paged for order. Will fax to [facility] when available- faxed 11:17AM. Rolling walker requested to be delivered to this room from [place].  Case closed.

## 2015-05-29 NOTE — Discharge Summary (Signed)
[place] at [place] NAME: Claire Kelley    MR#:  [id number]  DATE OF BIRTH:  [birth year]  DATE OF ADMISSION:  05/27/2015 ADMITTING PHYSICIAN: Fritzi Mandes, MD  DATE OF DISCHARGE: 05/29/2015  PRIMARY CARE PHYSICIAN: No primary care provider on file.    ADMISSION DIAGNOSIS:  Confusion [R41.0] NSTEMI (non-ST elevated myocardial infarction) [I21.4] SIRS (systemic inflammatory response syndrome) [A41.9]  DISCHARGE DIAGNOSIS:  Principal Problem:   Altered mental status Active Problems:   Acute cystitis   SECONDARY DIAGNOSIS:   Past Medical History  Diagnosis Date  . A-fib   . Hypertension   . GERD (gastroesophageal reflux disease)   . Renal disorder     CKD  . High cholesterol     HOSPITAL COURSE:   81 year old female with past medical history significant for chronic atrial fibrillation on Coumadin, hypertension 6 sinus syndrome with pacemaker, reflux disease presented to the hospital secondary to confusion and weakness.  #1 acute confusion-likely metabolic encephalopathy. -Mild cystitis noted on urinary analysis. -Could also be from the baclofen that she got prescribed from the emergency room for her back pain. Discontinue baclofen. Neuro checks for now. Continue to monitor at this time. Asked to baseline at this time-CT head with no acute intracranial pathology noted.  #2 A. fib with RVR-heart rate is elevated, patient did not take her Toprol for the last couple of days. Continue Toprol 100 mg daily -Added Cardizem 30 mg every 6 hours. Might not need Cardizem at discharge. Patient on Coumadin for anticoagulation. INR is subtherapeutic. Coumadin dose will be increased at discharge   #3 back pain-from pulled muscle. Discontinue baclofen. Patient is pain-free at this time. Physical therapy consult. Normal CPK  #4 elevated troponin-likely demand ischemia from elevated heart rate. Less likely to be myocardial infarction.  Recycle troponins. No contraindication to physical therapy at this point. Patient does not have any chest pain, no EKG changes.  #5 elevated lactic acid-likely from UTI. No sepsis noted. Normalized later. Discharge on oral antibiotics  DISCHARGE CONDITIONS:   Stable  CONSULTS OBTAINED:    none  DRUG ALLERGIES:   Allergies  Allergen Reactions  . Bactrim [Sulfamethoxazole-Trimethoprim] Other (See Comments)    Reaction:  Unknown   . Ciprocinonide [Fluocinolone] Other (See Comments)    Reaction:  Unknown   . Lipitor [Atorvastatin] Other (See Comments)    Reaction:  Leg cramps   . Macrobid [employer] Macro] Other (See Comments)    Reaction:  Unknown   . Paxil [Paroxetine Hcl] Other (See Comments)    Reaction:  Made pt "loopy"    DISCHARGE MEDICATIONS:   Current Discharge Medication List    START taking these medications   Details  cephALEXin (KEFLEX) 250 MG capsule Take 1 capsule (250 mg total) by mouth 3 (three) times daily. Qty: 9 capsule, Refills: 0      CONTINUE these medications which have CHANGED   Details  warfarin (COUMADIN) 2 MG tablet Take 1 tablet (2 mg total) by mouth daily at 6 PM. Qty: 30 tablet, Refills: 1      CONTINUE these medications which have NOT CHANGED   Details  acetaminophen (TYLENOL) 650 MG CR tablet Take 1,300 mg by mouth every 8 (eight) hours as needed for pain.    amLODipine (NORVASC) 2.5 MG tablet Take 2.5 mg by mouth daily.    aspirin EC 81 MG tablet Take 81 mg by mouth daily.    Cholecalciferol (VITAMIN D3) 1000 UNITS CAPS  Take 1 capsule by mouth daily.    clotrimazole-betamethasone (LOTRISONE) cream Apply 1 application topically 2 (two) times daily as needed (for irritation).    CRANBERRY-VITAMIN C PO Take 1 capsule by mouth 2 (two) times daily.    Iron-Vitamin C (VITRON-C PO) Take 1 tablet by mouth daily.    metoprolol succinate (TOPROL-XL) 100 MG 24 hr tablet Take 100 mg by mouth daily.    oxybutynin (DITROPAN) 5  MG tablet Take 5 mg by mouth daily.    pantoprazole (PROTONIX) 40 MG tablet Take 40 mg by mouth daily.    rosuvastatin (CRESTOR) 5 MG tablet Take 5 mg by mouth daily.      STOP taking these medications     baclofen (LIORESAL) 10 MG tablet          DISCHARGE INSTRUCTIONS:   #1 INR check in 3 days #2 home health #3 PCP follow-up in 2 weeks  If you experience worsening of your admission symptoms, develop shortness of breath, life threatening emergency, suicidal or homicidal thoughts you must seek medical attention immediately by calling 911 or calling your MD immediately  if symptoms less severe.  You Must read complete instructions/literature along with all the possible adverse reactions/side effects for all the Medicines you take and that have been prescribed to you. Take any new Medicines after you have completely understood and accept all the possible adverse reactions/side effects.   Please note  You were cared for by a hospitalist during your hospital stay. If you have any questions about your discharge medications or the care you received while you were in the hospital after you are discharged, you can call the unit and asked to speak with the hospitalist on call if the hospitalist that took care of you is not available. Once you are discharged, your primary care physician will handle any further medical issues. Please note that NO REFILLS for any discharge medications will be authorized once you are discharged, as it is imperative that you return to your primary care physician (or establish a relationship with a primary care physician if you do not have one) for your aftercare needs so that they can reassess your need for medications and monitor your lab values.    Today   CHIEF COMPLAINT:   Chief Complaint  Patient presents with  . Altered Mental Status    VITAL SIGNS:  Blood pressure 126/69, pulse 70, temperature 98.1 F (36.7 C), temperature source Oral, resp. rate  20, height 5\' 8"  (1.727 m), weight 69.128 kg (152 lb 6.4 oz), SpO2 98 %.  I/O:   Intake/Output Summary (Last 24 hours) at 05/29/15 1101 Last data filed at 05/29/15 0802  Gross per 24 hour  Intake 2711.67 ml  Output   1450 ml  Net 1261.67 ml    PHYSICAL EXAMINATION:   Physical Exam  GENERAL: 81 y.o.-year-old patient lying in the bed with no acute distress.  EYES: Pupils equal, round, reactive to light and accommodation. No scleral icterus. Extraocular muscles intact.  HEENT: Head atraumatic, normocephalic. Oropharynx and nasopharynx clear.  NECK: Supple, no jugular venous distention. No thyroid enlargement, no tenderness.  LUNGS: Normal breath sounds bilaterally, no wheezing, rales,rhonchi or crepitation. No use of accessory muscles of respiration.  CARDIOVASCULAR: S1, S2 normal. 3/6 systolic murmur present, No rubs, or gallops.  Pacemaker in place. ABDOMEN: Soft, nontender, nondistended. Bowel sounds present. No organomegaly or mass.  EXTREMITIES: No pedal edema, cyanosis, or clubbing.  NEUROLOGIC: Cranial nerves II through XII are intact. Muscle  strength 5/5 in all extremities. Sensation intact. Gait not checked.  PSYCHIATRIC: The patient is alert and oriented x 3.  SKIN: No obvious rash, lesion, or ulcer.   DATA REVIEW:   CBC  Recent Labs Lab 05/29/15 0659  WBC 7.5  HGB 11.7*  HCT 35.9  PLT 167    Chemistries   Recent Labs Lab 05/27/15 1721 05/29/15 0659  NA 143 142  K 4.7 3.8  CL 106 110  CO2 24 25  GLUCOSE 111* 113*  BUN 31* 20  CREATININE 1.78* 1.18*  CALCIUM 9.6 8.7*  AST 26  --   ALT 18  --   ALKPHOS 90  --   BILITOT 1.1  --     Cardiac Enzymes  Recent Labs Lab 05/28/15 1559  TROPONINI 0.03    Microbiology Results  Results for orders placed or performed during the hospital encounter of 05/27/15  Culture, blood (routine x 2)     Status: None (Preliminary result)   Collection Time: 05/27/15  6:40 PM  Result Value Ref Range  Status   Specimen Description BLOOD  Final   Special Requests NONE  Final   Culture NO GROWTH < 24 HOURS  Final   Report Status PENDING  Incomplete  Culture, blood (routine x 2)     Status: None (Preliminary result)   Collection Time: 05/27/15  6:45 PM  Result Value Ref Range Status   Specimen Description BLOOD  Final   Special Requests NONE  Final   Culture NO GROWTH < 24 HOURS  Final   Report Status PENDING  Incomplete    RADIOLOGY:  Dg Chest 2 View  05/27/2015   CLINICAL DATA:  Nervousness.  Visual disturbance.  EXAM: CHEST  2 VIEW  COMPARISON:  None.  FINDINGS: Normal cardiac silhouette and mediastinal contours. Atherosclerotic plaque within the thoracic aorta. Post median sternotomy and CABG. Left anterior chest wall dual lead pacemaker tips overlie the expected location of the right atrium and ventricle. The lungs appear mildly hyperexpanded. There is mild elevation / eventration of the anterior aspect the right hemidiaphragm. No focal airspace opacities. No pleural effusion or pneumothorax. No evidence of edema. No acute osseus abnormalities. Moderate scoliotic curvature of the thoracolumbar spine. Post endovascular repair of the abdominal aorta, incompletely evaluated.  IMPRESSION: Hyperexpanded lungs without acute cardiopulmonary disease.   Electronically Signed   By: Sandi Mariscal M.D.   On: 05/27/2015 19:25   Ct Head Wo Contrast  05/27/2015   CLINICAL DATA:  Confusion today  EXAM: CT HEAD WITHOUT CONTRAST  TECHNIQUE: Contiguous axial images were obtained from the base of the skull through the vertex without intravenous contrast.  COMPARISON:  None.  FINDINGS: Moderate global atrophy. Chronic ischemic changes in the periventricular white matter. No mass effect, midline shift, or acute intracranial hemorrhage. Mastoid air cells are clear. Mucosal 6 vein throughout the paranasal sinuses is present.  IMPRESSION: No acute intracranial pathology.  Chronic changes are noted.   Electronically  Signed   By: Marybelle Killings M.D.   On: 05/27/2015 18:31   US Carotid Bilateral  05/29/2015   CLINICAL DATA:  Syncopal episodes, hypertension, dizziness  EXAM: BILATERAL CAROTID DUPLEX ULTRASOUND  TECHNIQUE: Pearline Cables scale imaging, color Doppler and duplex ultrasound was performed of bilateral carotid and vertebral arteries in the neck.  COMPARISON:  None.  REVIEW OF SYSTEMS: Quantification of carotid stenosis is based on velocity parameters that correlate the residual internal carotid diameter with NASCET-based stenosis levels, using the diameter of the distal  internal carotid lumen as the denominator for stenosis measurement.  The following velocity measurements were obtained:  PEAK SYSTOLIC/END DIASTOLIC  RIGHT  ICA:                     80/15cm/sec  CCA:                     99991111  SYSTOLIC ICA/CCA RATIO:  0.7  DIASTOLIC ICA/CCA RATIO: 0.7  ECA:                     79cm/sec  LEFT  ICA:                     150/22cm/sec  CCA:                     99991111  SYSTOLIC ICA/CCA RATIO:  1.6  DIASTOLIC ICA/CCA RATIO: 1.8  ECA:                     84cm/sec  FINDINGS: RIGHT CAROTID ARTERY: Mild partially calcified plaque at the carotid bifurcation involving origins of internal and external carotid arteries, without high-grade stenosis. Normal waveforms and color Doppler signal.  RIGHT VERTEBRAL ARTERY:  Normal flow direction and waveform.  LEFT CAROTID ARTERY: Mild eccentric calcified plaque in the bulb and proximal ICA without high-grade stenosis. Normal waveforms and color Doppler signal.  LEFT VERTEBRAL ARTERY: Normal flow direction and waveform. A nonspecific cardiac arrhythmia is noted.  IMPRESSION: 1. Bilateral proximal ICA plaque left greater than right, resulting in less than 50% diameter stenosis. The exam does not exclude plaque ulceration or embolization. Continued surveillance recommended. 2. Nonspecific cardiac arrhythmia.  Correlate with EKG.   Electronically Signed   By: Lucrezia Europe M.D.   On: 05/29/2015  10:44    EKG:   Orders placed or performed during the hospital encounter of 05/27/15  . ED EKG  . ED EKG  . ED EKG  . ED EKG  . EKG 12-Lead  . EKG 12-Lead      Management plans discussed with the patient, family and they are in agreement.  CODE STATUS:     Code Status Orders        Start     Ordered   05/28/15 0100  Full code   Continuous     05/28/15 0059    Advance Directive Documentation        Most Recent Value   Type of Advance Directive  Healthcare Power of Attorney, Living will   Pre-existing out of facility DNR order (yellow form or pink MOST form)     "MOST" Form in Place?        TOTAL TIME TAKING CARE OF THIS PATIENT: 38 minutes.    Gladstone Lighter M.D on 05/29/2015 at 11:01 AM  Between 7am to 6pm - Pager - [phone]  After 6pm go to [email or web address] - password [email or web address] [facility]  [facility]  Office  [phone]  CC: Primary care physician; No primary care provider on file.

## 2015-06-01 LAB — CULTURE, BLOOD (ROUTINE X 2)
CULTURE: NO GROWTH
Culture: NO GROWTH

## 2015-06-16 NOTE — H&P (Signed)
[facility] History and physical  PATIENT NAME: Claire Kelley    MR#:  [id number]  DATE OF BIRTH:  1926/02/23  DATE OF ADMISSION:  05/27/2015  PRIMARY CARE PHYSICIAN: Dr Lora Paula in [facility]  REQUESTING/REFERRING PHYSICIAN: Dr Edd Fabian  CHIEF COMPLAINT:   I did not feel good since I woke up HISTORY OF PRESENT ILLNESS:  Ellarae Landis  is a 81 y.o. female with a known history of A. fib on Coumadin, hypertension, GERD, hyperlipidemia comes to the emergency room accompanied by daughters with increasing weakness and altered mental status as noted by daughters. According to patient she woke up this morning not feeling too well. She was brought to the emergency room was found to be tachycardic in the 110s. Her lactic acid level was 2.3. Patient did have some low back pain. UA was positive for hematuria and few pus cells. She is afebrile blood pressure is stable mild tachycardic.  In the emergency room, patient received IV vancomycin and Zosyn due to her elevated lactic acid and meeting criteria for SIRS. She is being admitted for further evaluation and management.  PAST MEDICAL HISTORY:   Past Medical History  Diagnosis Date  . A-fib   . Hypertension   . GERD (gastroesophageal reflux disease)   . Renal disorder     CKD  . High cholesterol     PAST SURGICAL HISTOIRY:   Past Surgical History  Procedure Laterality Date  . Pacemaker placement    . Abdominal aortic aneurysm repair    . Coronary artery bypass graft      SOCIAL HISTORY:   History  Substance Use Topics  . Smoking status: Never Smoker   . Smokeless tobacco: Not on file  . Alcohol Use: No    FAMILY HISTORY:  No family history on file.  DRUG ALLERGIES:   Allergies  Allergen Reactions  . Bactrim [Sulfamethoxazole-Trimethoprim] Other (See Comments)    Reaction:  Unknown   . Ciprocinonide [Fluocinolone] Other (See Comments)    Reaction:  Unknown   . Lipitor  [Atorvastatin] Other (See Comments)    Reaction:  Leg cramps   . Macrobid [employer] Macro] Other (See Comments)    Reaction:  Unknown   . Paxil [Paroxetine Hcl] Other (See Comments)    Reaction:  Made pt "loopy"    REVIEW OF SYSTEMS:  Review of Systems  Constitutional: Negative for fever, chills and diaphoresis.  HENT: Negative for congestion, ear pain, hearing loss, nosebleeds and sore throat.   Eyes: Negative for blurred vision, double vision, photophobia and pain.  Respiratory: Negative for hemoptysis, sputum production, wheezing and stridor.   Cardiovascular: Negative for orthopnea, claudication and leg swelling.  Gastrointestinal: Negative for heartburn and abdominal pain.  Genitourinary: Positive for frequency. Negative for dysuria.  Musculoskeletal: Positive for back pain. Negative for joint pain and neck pain.  Skin: Negative for rash.  Neurological: Positive for weakness. Negative for sensory change, speech change and headaches.  Endo/Heme/Allergies: Does not bruise/bleed easily.  Psychiatric/Behavioral: Negative for memory loss. The patient is not nervous/anxious.   All other systems reviewed and are negative.    MEDICATIONS AT HOME:   Prior to Admission medications   Medication Sig Start Date End Date Taking? Authorizing Provider  acetaminophen (TYLENOL) 650 MG CR tablet Take 1,300 mg by mouth every 8 (eight) hours as needed for pain.   Yes Historical Provider, MD  amLODipine (NORVASC) 2.5 MG tablet Take 2.5 mg by mouth daily.  Yes Historical Provider, MD  aspirin EC 81 MG tablet Take 81 mg by mouth daily.   Yes Historical Provider, MD  baclofen (LIORESAL) 10 MG tablet Take 5 mg by mouth 3 (three) times daily as needed for muscle spasms.   Yes Historical Provider, MD  Cholecalciferol (VITAMIN D3) 1000 UNITS CAPS Take 1 capsule by mouth daily.   Yes Historical Provider, MD  clotrimazole-betamethasone (LOTRISONE) cream Apply 1 application topically 2 (two)  times daily as needed (for irritation).   Yes Historical Provider, MD  CRANBERRY-VITAMIN C PO Take 1 capsule by mouth 2 (two) times daily.   Yes Historical Provider, MD  Iron-Vitamin C (VITRON-C PO) Take 1 tablet by mouth daily.   Yes Historical Provider, MD  metoprolol succinate (TOPROL-XL) 100 MG 24 hr tablet Take 100 mg by mouth daily.   Yes Historical Provider, MD  oxybutynin (DITROPAN) 5 MG tablet Take 5 mg by mouth daily.   Yes Historical Provider, MD  pantoprazole (PROTONIX) 40 MG tablet Take 40 mg by mouth daily.   Yes Historical Provider, MD  rosuvastatin (CRESTOR) 5 MG tablet Take 5 mg by mouth daily.   Yes Historical Provider, MD  warfarin (COUMADIN) 1 MG tablet Take 1-2 mg by mouth daily. Pt takes 2mg  on Wednesday and 1mg  all other days.   Yes Historical Provider, MD      VITAL SIGNS:  Blood pressure 150/81, pulse 92, temperature 98 F (36.7 C), temperature source Oral, resp. rate 18, height 5\' 7"  (1.702 m), weight 70.308 kg (155 lb), SpO2 96 %.  PHYSICAL EXAMINATION:  GENERAL:  81 y.o.-year-old patient lying in the bed with no acute distress.  EYES: Pupils equal, round, reactive to light and accommodation. No scleral icterus. Extraocular muscles intact.  HEENT: Head atraumatic, normocephalic. Oropharynx and nasopharynx clear.  NECK:  Supple, no jugular venous distention. No thyroid enlargement, no tenderness.  LUNGS: Normal breath sounds bilaterally, no wheezing, rales,rhonchi or crepitation. No use of accessory muscles of respiration.  CARDIOVASCULAR: S1, S2 normal. No murmurs, rubs, or gallops.  ABDOMEN: Soft, nontender, nondistended. Bowel sounds present. No organomegaly or mass.  EXTREMITIES: No pedal edema, cyanosis, or clubbing.  NEUROLOGIC: Cranial nerves II through XII are intact. Muscle strength 5/5 in all extremities. Sensation intact. Gait not checked.  PSYCHIATRIC: The patient is alert and oriented x 2. Some anxiety and mild confusion SKIN: No obvious rash, lesion,  or ulcer.   LABORATORY PANEL:   CBC  Recent Labs Lab 05/27/15 1721  WBC 11.0  HGB 13.9  HCT 42.8  PLT 212   ------------------------------------------------------------------------------------------------------------------  Chemistries   Recent Labs Lab 05/27/15 1721  NA 143  K 4.7  CL 106  CO2 24  GLUCOSE 111*  BUN 31*  CREATININE 1.78*  CALCIUM 9.6  AST 26  ALT 18  ALKPHOS 90  BILITOT 1.1   ------------------------------------------------------------------------------------------------------------------  Cardiac Enzymes  Recent Labs Lab 05/27/15 1721  TROPONINI 0.04*   ------------------------------------------------------------------------------------------------------------------  RADIOLOGY:  Dg Chest 2 View  05/27/2015   CLINICAL DATA:  Nervousness.  Visual disturbance.  EXAM: CHEST  2 VIEW  COMPARISON:  None.  FINDINGS: Normal cardiac silhouette and mediastinal contours. Atherosclerotic plaque within the thoracic aorta. Post median sternotomy and CABG. Left anterior chest wall dual lead pacemaker tips overlie the expected location of the right atrium and ventricle. The lungs appear mildly hyperexpanded. There is mild elevation / eventration of the anterior aspect the right hemidiaphragm. No focal airspace opacities. No pleural effusion or pneumothorax. No evidence of edema. No  acute osseus abnormalities. Moderate scoliotic curvature of the thoracolumbar spine. Post endovascular repair of the abdominal aorta, incompletely evaluated.  IMPRESSION: Hyperexpanded lungs without acute cardiopulmonary disease.   Electronically Signed   By: Sandi Mariscal M.D.   On: 05/27/2015 19:25   Ct Head Wo Contrast  05/27/2015   CLINICAL DATA:  Confusion today  EXAM: CT HEAD WITHOUT CONTRAST  TECHNIQUE: Contiguous axial images were obtained from the base of the skull through the vertex without intravenous contrast.  COMPARISON:  None.  FINDINGS: Moderate global atrophy. Chronic  ischemic changes in the periventricular white matter. No mass effect, midline shift, or acute intracranial hemorrhage. Mastoid air cells are clear. Mucosal 6 vein throughout the paranasal sinuses is present.  IMPRESSION: No acute intracranial pathology.  Chronic changes are noted.   Electronically Signed   By: Marybelle Killings M.D.   On: 05/27/2015 18:31    EKG:   Afib with RVR IMPRESSION AND PLAN:    81 year old Caucasian female with past medical history of chronic A. fib on Coumadin, hypertension, GERD, hyperlipidemia comes in with  #1 altered mental status suspected due to mild UTI Admit to medical floor for overnight observation. IV fluids IV Rocephin Follow-up blood culture urine culture. Follow-up lactic acid level.  #2 chronic A. fib on Coumadin with INR 1.45. Patient has RBCs in her urine her H&H is stable therefore for now I will continue her Coumadin she does not have frank hematuria at present. Monitor INR.  #3 Hypertension Continue amlodipine and metoprolol #4 hyperlipidemia continue Crestor. #5 low back pain continue when necessary Tylenol. #6 DVT prophylaxis on Coumadin  All the records are reviewed and case discussed with ED provider. Management plans discussed with the patient, family and they are in agreement.  CODE STATUS: Full  TOTAL TIME TAKING CARE OF THIS PATIENT: 74minutes.    Prestyn Stanco M.D on 05/27/2015  Between 7am to 6pm - Pager - [phone]  After 6pm go to [email or web address] - password [email or web address] [facility]  [facility]  Office  [phone]  CC: Primary care physician; No primary care provider on file.

## 2016-04-20 ENCOUNTER — Emergency Department: Payer: Medicare Other

## 2016-04-20 ENCOUNTER — Emergency Department
Admission: EM | Admit: 2016-04-20 | Discharge: 2016-04-21 | Disposition: A | Discharge status: Home / Self Care | Payer: Medicare Other | Attending: Emergency Medicine | Admitting: Emergency Medicine

## 2016-04-20 DIAGNOSIS — S300XXA Contusion of lower back and pelvis, initial encounter: Secondary | ICD-10-CM | POA: Diagnosis not present

## 2016-04-20 DIAGNOSIS — Z7982 Long term (current) use of aspirin: Secondary | ICD-10-CM | POA: Insufficient documentation

## 2016-04-20 DIAGNOSIS — W01198A Fall on same level from slipping, tripping and stumbling with subsequent striking against other object, initial encounter: Secondary | ICD-10-CM | POA: Insufficient documentation

## 2016-04-20 DIAGNOSIS — Z951 Presence of aortocoronary bypass graft: Secondary | ICD-10-CM | POA: Insufficient documentation

## 2016-04-20 DIAGNOSIS — Y9301 Activity, walking, marching and hiking: Secondary | ICD-10-CM | POA: Diagnosis not present

## 2016-04-20 DIAGNOSIS — M25551 Pain in right hip: Secondary | ICD-10-CM | POA: Diagnosis present

## 2016-04-20 DIAGNOSIS — I4891 Unspecified atrial fibrillation: Secondary | ICD-10-CM | POA: Diagnosis not present

## 2016-04-20 DIAGNOSIS — Z950 Presence of cardiac pacemaker: Secondary | ICD-10-CM | POA: Diagnosis not present

## 2016-04-20 DIAGNOSIS — Y929 Unspecified place or not applicable: Secondary | ICD-10-CM | POA: Insufficient documentation

## 2016-04-20 DIAGNOSIS — S41112A Laceration without foreign body of left upper arm, initial encounter: Secondary | ICD-10-CM

## 2016-04-20 DIAGNOSIS — Y999 Unspecified external cause status: Secondary | ICD-10-CM | POA: Insufficient documentation

## 2016-04-20 DIAGNOSIS — S0990XA Unspecified injury of head, initial encounter: Secondary | ICD-10-CM | POA: Insufficient documentation

## 2016-04-20 DIAGNOSIS — Z79899 Other long term (current) drug therapy: Secondary | ICD-10-CM | POA: Diagnosis not present

## 2016-04-20 DIAGNOSIS — I10 Essential (primary) hypertension: Secondary | ICD-10-CM | POA: Insufficient documentation

## 2016-04-20 DIAGNOSIS — T07XXXA Unspecified multiple injuries, initial encounter: Secondary | ICD-10-CM

## 2016-04-20 DIAGNOSIS — S32591A Other specified fracture of right pubis, initial encounter for closed fracture: Secondary | ICD-10-CM

## 2016-04-20 DIAGNOSIS — S32009A Unspecified fracture of unspecified lumbar vertebra, initial encounter for closed fracture: Secondary | ICD-10-CM

## 2016-04-20 LAB — CBC WITH DIFFERENTIAL/PLATELET
BASOS ABS: 0.1 10*3/uL (ref 0–0.1)
Basophils Relative: 0 %
Eosinophils Absolute: 0.2 10*3/uL (ref 0–0.7)
HCT: 33.2 % — ABNORMAL LOW (ref 35.0–47.0)
Hemoglobin: 11.2 g/dL — ABNORMAL LOW (ref 12.0–16.0)
Lymphs Abs: 1 10*3/uL (ref 1.0–3.6)
MCH: 32.8 pg (ref 26.0–34.0)
MCHC: 33.6 g/dL (ref 32.0–36.0)
MCV: 97.5 fL (ref 80.0–100.0)
MONO ABS: 0.9 10*3/uL (ref 0.2–0.9)
Monocytes Relative: 7 %
Neutro Abs: 10.8 10*3/uL — ABNORMAL HIGH (ref 1.4–6.5)
Neutrophils Relative %: 83 %
PLATELETS: 147 10*3/uL — AB (ref 150–440)
RBC: 3.41 MIL/uL — ABNORMAL LOW (ref 3.80–5.20)
RDW: 13.6 % (ref 11.5–14.5)
WBC: 13.1 10*3/uL — ABNORMAL HIGH (ref 3.6–11.0)

## 2016-04-20 LAB — PROTIME-INR
INR: 2.05
Prothrombin Time: 23 seconds — ABNORMAL HIGH (ref 11.4–15.0)

## 2016-04-20 NOTE — ED Notes (Signed)
Pt fell outside while feeding animals.  During fall pt scraped back, has bruising to lower R back, pain to R hip/groin, and pt did hit head.  Pt on warfarin and history of cardiac bypass and pacemaker.  BP 203/106 reported by EMS.  Pt does have history of htn.

## 2016-04-20 NOTE — Discharge Instructions (Signed)
Contusion A contusion is a deep bruise. Contusions are the result of a blunt injury to tissues and muscle fibers under the skin. The injury causes bleeding under the skin. The skin overlying the contusion may turn blue, purple, or yellow. Minor injuries will give you a painless contusion, but more severe contusions may stay painful and swollen for a few weeks.  CAUSES  This condition is usually caused by a blow, trauma, or direct force to an area of the body. SYMPTOMS  Symptoms of this condition include:  Swelling of the injured area.  Pain and tenderness in the injured area.  Discoloration. The area may have redness and then turn blue, purple, or yellow. DIAGNOSIS  This condition is diagnosed based on a physical exam and medical history. An X-ray, CT scan, or MRI may be needed to determine if there are any associated injuries, such as broken bones (fractures). TREATMENT  Specific treatment for this condition depends on what area of the body was injured. In general, the best treatment for a contusion is resting, icing, applying pressure to (compression), and elevating the injured area. This is often called the RICE strategy. Over-the-counter anti-inflammatory medicines may also be recommended for pain control.  HOME CARE INSTRUCTIONS   Rest the injured area.  If directed, apply ice to the injured area:  Put ice in a plastic bag.  Place a towel between your skin and the bag.  Leave the ice on for 20 minutes, 2-3 times per day.  If directed, apply light compression to the injured area using an elastic bandage. Make sure the bandage is not wrapped too tightly. Remove and reapply the bandage as directed by your health care provider.  If possible, raise (elevate) the injured area above the level of your heart while you are sitting or lying down.  Take over-the-counter and prescription medicines only as told by your health care provider. SEEK MEDICAL CARE IF:  Your symptoms do not  improve after several days of treatment.  Your symptoms get worse.  You have difficulty moving the injured area. SEEK IMMEDIATE MEDICAL CARE IF:   You have severe pain.  You have numbness in a hand or foot.  Your hand or foot turns pale or cold.   This information is not intended to replace advice given to you by your health care provider. Make sure you discuss any questions you have with your health care provider.   Document Released: 09/14/2005 Document Revised: 08/26/2015 Document Reviewed: 04/22/2015 Elsevier Interactive Patient Education 2016 [place] therapy can help ease sore, stiff, injured, and tight muscles and joints. Heat relaxes your muscles, which may help ease your pain.  RISKS AND COMPLICATIONS If you have any of the following conditions, do not use heat therapy unless your health care provider has approved:  Poor circulation.  Healing wounds or scarred skin in the area being treated.  Diabetes, heart disease, or high blood pressure.  Not being able to feel (numbness) the area being treated.  Unusual swelling of the area being treated.  Active infections.  Blood clots.  Cancer.  Inability to communicate pain. This may include young children and people who have problems with their brain function (dementia).  Pregnancy. Heat therapy should only be used on old, pre-existing, or long-lasting (chronic) injuries. Do not use heat therapy on new injuries unless directed by your health care provider. HOW TO USE HEAT THERAPY There are several different kinds of heat therapy, including:  Moist heat pack.  Warm water bath.  Hot water bottle.  Electric heating pad.  Heated gel pack.  Heated wrap.  Electric heating pad. Use the heat therapy method suggested by your health care provider. Follow your health care provider's instructions on when and how to use heat therapy. GENERAL HEAT THERAPY RECOMMENDATIONS  Do not sleep while  using heat therapy. Only use heat therapy while you are awake.  Your skin may turn pink while using heat therapy. Do not use heat therapy if your skin turns red.  Do not use heat therapy if you have new pain.  High heat or long exposure to heat can cause burns. Be careful when using heat therapy to avoid burning your skin.  Do not use heat therapy on areas of your skin that are already irritated, such as with a rash or sunburn. SEEK MEDICAL CARE IF:  You have blisters, redness, swelling, or numbness.  You have new pain.  Your pain is worse. MAKE SURE YOU:  Understand these instructions.  Will watch your condition.  Will get help right away if you are not doing well or get worse.   This information is not intended to replace advice given to you by your health care provider. Make sure you discuss any questions you have with your health care provider.   Document Released: 02/27/2012 Document Revised: 12/26/2014 Document Reviewed: 01/28/2014 Elsevier Interactive Patient Education 2016 Else[employer] Inc[employer]  Musculoskeletal Pain Musculoskeletal pain is muscle and boney aches and pains. These pains can occur in any part of the body. Your caregiver may treat you without knowing the cause of the pain. They may treat you if blood or urine tests, X-rays, and other tests were normal.  CAUSES There is often not a definite cause or reason for these pains. These pains may be caused by a type of germ (virus). The discomfort may also come from overuse. Overuse includes working out too hard when your body is not fit. Boney aches also come from weather changes. Bone is sensitive to atmospheric pressure changes. HOME CARE INSTRUCTIONS   Ask when your test results will be ready. Make sure you get your test results.  Only take over-the-counter or prescription medicines for pain, discomfort, or fever as directed by your caregiver. If you were given medications for your condition, do not drive, operate  machinery or power tools, or sign legal documents for 24 hours. Do not drink alcohol. Do not take sleeping pills or other medications that may interfere with treatment.  Continue all activities unless the activities cause more pain. When the pain lessens, slowly resume normal activities. Gradually increase the intensity and duration of the activities or exercise.  During periods of severe pain, bed rest may be helpful. Lay or sit in any position that is comfortable.  Putting ice on the injured area.  Put ice in a bag.  Place a towel between your skin and the bag.  Leave the ice on for 15 to 20 minutes, 3 to 4 times a day.  Follow up with your caregiver for continued problems and no reason can be found for the pain. If the pain becomes worse or does not go away, it may be necessary to repeat tests or do additional testing. Your caregiver may need to look further for a possible cause. SEEK IMMEDIATE MEDICAL CARE IF:  You have pain that is getting worse and is not relieved by medications.  You develop chest pain that is associated with shortness or breath, sweating, feeling sick to your  stomach (nauseous), or throw up (vomit).  Your pain becomes localized to the abdomen.  You develop any new symptoms that seem different or that concern you. MAKE SURE YOU:   Understand these instructions.  Will watch your condition.  Will get help right away if you are not doing well or get worse.   This information is not intended to replace advice given to you by your health care provider. Make sure you discuss any questions you have with your health care provider.   Document Released: 12/05/2005 Document Revised: 02/27/2012 Document Reviewed: 08/09/2013 Elsevier Interactive Patient Education 2016 [place] Taking care of your wound properly can help to prevent pain and infection. It can also help your wound to heal more quickly.  HOW TO CARE FOR YOUR WOUND  Take or apply  over-the-counter and prescription medicines only as told by your health care provider.  If you were prescribed antibiotic medicine, take or apply it as told by your health care provider. Do not stop using the antibiotic even if your condition improves.  Clean the wound each day or as told by your health care provider.  Wash the wound with mild soap and water.  Rinse the wound with water to remove all soap.  Pat the wound dry with a clean towel. Do not rub it.  There are many different ways to close and cover a wound. For example, a wound can be covered with stitches (sutures), skin glue, or adhesive strips. Follow instructions from your health care provider about:  How to take care of your wound.  When and how you should change your bandage (dressing).  When you should remove your dressing.  Removing whatever was used to close your wound.  Check your wound every day for signs of infection. Watch for:  Redness, swelling, or pain.  Fluid, blood, or pus.  Keep the dressing dry until your health care provider says it can be removed. Do not take baths, swim, use a hot tub, or do anything that would put your wound underwater until your health care provider approves.  Raise (elevate) the injured area above the level of your heart while you are sitting or lying down.  Do not scratch or pick at the wound.  Keep all follow-up visits as told by your health care provider. This is important. SEEK MEDICAL CARE IF:  You received a tetanus shot and you have swelling, severe pain, redness, or bleeding at the injection site.  You have a fever.  Your pain is not controlled with medicine.  You have increased redness, swelling, or pain at the site of your wound.  You have fluid, blood, or pus coming from your wound.  You notice a bad smell coming from your wound or your dressing. SEEK IMMEDIATE MEDICAL CARE IF:  You have a red streak going away from your wound.   This information is  not intended to replace advice given to you by your health care provider. Make sure you discuss any questions you have with your health care provider.   Document Released: 09/13/2008 Document Revised: 04/21/2015 Document Reviewed: 12/01/2014 Elsevier Interactive Patient Education [employer].

## 2016-04-20 NOTE — ED Provider Notes (Signed)
[facility] Emergency Department Provider Note  ____________________________________________  Time seen: 9:45 PM  I have reviewed the triage vital signs and the nursing notes.   HISTORY  Chief Complaint Fall    HPI Claire Kelley is a 82 y.o. female had been outside feeding her gold finches earlier today, and when she was walking back up the steps to the house she tripped and fell. She fell onto her back and then hit her head on the ground. She denies any preceding symptoms such as chest pain abdominal pain back pain numbness tingling weakness or headaches. After the fall she had some pain in the right groin right hip area and was able to stand up and walk with a cane.  She denies any other complaints. She does report a small wound to the left upper arm. Tetanus is up-to-date from one year ago. Has some pain in the lower back as well. Patient is on Coumadin. Recent INR 1.9 per the patient. Denies headache but she did hit her head on the ground.    Past Medical History  Diagnosis Date  . A-fib ([place])   . Hypertension   . GERD (gastroesophageal reflux disease)   . Renal disorder     CKD  . High cholesterol      Patient Active Problem List   Diagnosis Date Noted  . Acute cystitis 05/29/2015  . Altered mental status 05/27/2015     Past Surgical History  Procedure Laterality Date  . Pacemaker placement    . Abdominal aortic aneurysm repair    . Coronary artery bypass graft       Current Outpatient Rx  Name  Route  Sig  Dispense  Refill  . acetaminophen (TYLENOL) 650 MG CR tablet   Oral   Take 1,300 mg by mouth every 8 (eight) hours as needed for pain.         Marland Kitchen aspirin EC 81 MG tablet   Oral   Take 81 mg by mouth daily.         . Cholecalciferol (VITAMIN D3) 1000 UNITS CAPS   Oral   Take 1 capsule by mouth daily.         . clotrimazole-betamethasone (LOTRISONE) cream   Topical   Apply 1 application topically 2 (two) times daily  as needed (for irritation).         . CRANBERRY-VITAMIN C PO   Oral   Take 1 capsule by mouth 2 (two) times daily.         . Iron-Vitamin C (VITRON-C PO)   Oral   Take 1 tablet by mouth daily.         . Melatonin 1 MG TABS   Oral   Take 1 tablet by mouth at bedtime.         . metoprolol succinate (TOPROL-XL) 100 MG 24 hr tablet   Oral   Take 100 mg by mouth daily.         Marland Kitchen oxybutynin (DITROPAN) 5 MG tablet   Oral   Take 5 mg by mouth daily.         . pantoprazole (PROTONIX) 40 MG tablet   Oral   Take 40 mg by mouth daily.         . rosuvastatin (CRESTOR) 5 MG tablet   Oral   Take 5 mg by mouth daily.         Marland Kitchen warfarin (COUMADIN) 1 MG tablet   Oral   Take 1 mg  by mouth See admin instructions. Takes 1 tablet everyday except Tuesdays and thursdays         . warfarin (COUMADIN) 2 MG tablet   Oral   Take 1 tablet (2 mg total) by mouth daily at 6 PM. Patient taking differently: Take 2 mg by mouth See admin instructions. Tuesdays and thursdays   30 tablet   1      Allergies Bactrim; Ciprocinonide; Lipitor; Macrobid; and Paxil   No family history on file.  Social History Social History  Substance Use Topics  . Smoking status: Never Smoker   . Smokeless tobacco: None  . Alcohol Use: No    Review of Systems  Constitutional:   No fever or chills.  Eyes:   No vision changes.  ENT:   No sore throat. No rhinorrhea. Cardiovascular:   No chest pain. Respiratory:   No dyspnea or cough. Gastrointestinal:   Negative for abdominal pain, vomiting and diarrhea.  Genitourinary:   Negative for dysuria or difficulty urinating. Musculoskeletal:   Right hip pain, lower back pain. Left arm wound. Neurological:   Negative for headaches 10-point ROS otherwise negative.  ____________________________________________   PHYSICAL EXAM:  VITAL SIGNS: ED Triage Vitals  Enc Vitals Group     BP 04/20/16 2130 177/88 mmHg     Pulse Rate 04/20/16 2130 74      Resp 04/20/16 2130 18     Temp 04/20/16 2130 98.2 F (36.8 C)     Temp Source 04/20/16 2130 Oral     SpO2 04/20/16 2130 98 %     Weight 04/20/16 2117 150 lb (68.04 kg)     Height 04/20/16 2117 5\' 7"  (1.702 m)     Head Cir --      Peak Flow --      Pain Score --      Pain Loc --      Pain Edu? --      Excl. in [place]? --     Vital signs reviewed, nursing assessments reviewed.   Constitutional:   Alert and oriented. Well appearing and in no distress. Eyes:   No scleral icterus. No conjunctival pallor. PERRL. EOMI.  No nystagmus. ENT   Head:   Normocephalic and atraumatic.   Nose:   No congestion/rhinnorhea. No septal hematoma   Mouth/Throat:   MMM, no pharyngeal erythema. No peritonsillar mass.    Neck:   No stridor. No SubQ emphysema. No meningismus. Hematological/Lymphatic/Immunilogical:   No cervical lymphadenopathy. Cardiovascular:   RRR. Symmetric bilateral radial and DP pulses.  No murmurs.  Respiratory:   Normal respiratory effort without tachypnea nor retractions. Breath sounds are clear and equal bilaterally. No wheezes/rales/rhonchi. Gastrointestinal:   Soft and nontender. Non distended. There is no CVA tenderness.  No rebound, rigidity, or guarding. Genitourinary:   deferred Musculoskeletal:   Nontender with normal range of motion in all extremities. No joint effusions.  No lower extremity tenderness.  No edema. Slight tenderness to palpation over the lumbar spine. There is concave right scoliosis of the lumbar spine. No step-off or crepitus. Neurologic:   Normal speech and language.  CN 2-10 normal. Motor grossly intact. No gross focal neurologic deficits are appreciated.  Skin:   There is a 3 cm skin tear over the left upper arm. Hemostatic, well approximated..  ____________________________________________    LABS (pertinent positives/negatives) (all labs ordered are listed, but only abnormal results are displayed) Labs Reviewed  PROTIME-INR - Abnormal;  Notable for the following:    Prothrombin Time  23.0 (*)    All other components within normal limits  CBC WITH DIFFERENTIAL/PLATELET   ____________________________________________   EKG  Interpreted by me Atrial paced, rate of 80, normal axis. Normal intervals. Poor R progression In anterior precordial leads, normal ST segments. Normal T waves.  ____________________________________________    RADIOLOGY  CT head unremarkable X-ray right hip unremarkable X-ray lumbar spine unremarkable CT pelvis pending  ____________________________________________   PROCEDURES   ____________________________________________   INITIAL IMPRESSION / ASSESSMENT AND PLAN / ED COURSE  Pertinent labs & imaging results that were available during my care of the patient were reviewed by me and considered in my medical decision making (see chart for details).  Patient presents after mechanical fall with multiple abrasions over the back. She reported some right groin pain after the fall, but has no tenderness on exam and has full range of motion at present. We'll get x-rays. Check INR. She Is up to date.  ----------------------------------------- 11:24 PM on 04/20/2016 -----------------------------------------  INR 2.0. Workup negative. Has persistent right groin pain with ambulation so proceed with CT of the pelvis to evaluate for occult fracture. Case of the signed out to oncoming physician Dr. Dineen Kid at 11:30 PM to follow up on CT.     ____________________________________________   FINAL CLINICAL IMPRESSION(S) / ED DIAGNOSES  Final diagnoses:  Abrasions of multiple sites  Lumbar contusion, initial encounter  Skin tear of left upper arm without complication, initial encounter  Right hip pain       Portions of this note were generated with dragon dictation software. Dictation errors may occur despite best attempts at proofreading.   Carrie Mew, MD 04/20/16 [phone]

## 2016-04-21 NOTE — ED Provider Notes (Signed)
Signout from Dr. Joni Fears in this 82 year old female with a mechanical fall and right-sided back as well as pelvic pain. Pending CAT scan. Patient was able to walk 3 steps but with difficulty.  Physical Exam  BP 163/75 mmHg  Pulse 63  Temp(Src) 98.2 F (36.8 C) (Oral)  Resp 19  Ht 5\' 7"  (1.702 m)  Wt 150 lb (68.04 kg)  BMI 23.49 kg/m2  SpO2 97% ----------------------------------------- 12:51 AM on 04/21/2016 -----------------------------------------   Physical Exam without any distress. Lying in bed.  ED Course  Procedures   IMPRESSION: No acute fracture or dislocation in the right or left hips. Nondisplaced fractures of the right superior and inferior pubic rami. Nondisplaced fracture of the right transverse process of L5. Soft tissue contusion/hematoma demonstrated in the right lower paraspinal muscles and in the subcutaneous fat over the right hip.   Electronically Signed By: Lucienne Capers M.D. On: 04/21/2016 00:14  MDM Discussed case with Dr. Danelle Earthly believes the patient can be safely discharged home. Says that the injury will likely heal in several weeks. Also discussed with the patient is on Coumadin so this may result in increased bruising. I discussed the diagnosis with the patient as well as her family who are at the bedside. She will use a cane at home. They are aware of the small hematomas. I also discussed precautions including any lightheadedness or worsening of her bruising and to return immediately if there are any worsening or concerning symptoms. Due to the very small size of the hematomas at this time I do not believe that she needs any reversal of her anticoagulation. I did offer admission but the patient is more comfortable going home and her family says that they will help her ambulate and do her activities of daily living. I also discussed pain control the patient says "I can only take Tylenol." He also discussed ice as well as sitting on a pillow or  doughnut pillow for comfort. She'll be given follow-up with orthopedics and will also be following up with her primary care doctor.   Diagnosis of lumbar transverse process fracture as well as pubic rami fractures.     Orbie Pyo, MD 04/21/16 [phone]

## 2016-04-21 NOTE — ED Notes (Signed)
Pt discharged to home.  Family member driving.  Discharge instructions reviewed.  Verbalized understanding.  No questions or concerns at this time.  Teach back verified.  Pt in NAD.  No items left in ED.   

## 2017-07-21 ENCOUNTER — Emergency Department (HOSPITAL_COMMUNITY)
Admission: EM | Admit: 2017-07-21 | Discharge: 2017-07-22 | Disposition: A | Discharge status: Home / Self Care | Payer: Medicare Other | Attending: Emergency Medicine | Admitting: Emergency Medicine

## 2017-07-21 ENCOUNTER — Encounter (HOSPITAL_COMMUNITY): Payer: Self-pay | Admitting: *Deleted

## 2017-07-21 DIAGNOSIS — Z7901 Long term (current) use of anticoagulants: Secondary | ICD-10-CM | POA: Diagnosis not present

## 2017-07-21 DIAGNOSIS — I4891 Unspecified atrial fibrillation: Secondary | ICD-10-CM | POA: Diagnosis not present

## 2017-07-21 DIAGNOSIS — Z7982 Long term (current) use of aspirin: Secondary | ICD-10-CM | POA: Insufficient documentation

## 2017-07-21 DIAGNOSIS — I10 Essential (primary) hypertension: Secondary | ICD-10-CM | POA: Insufficient documentation

## 2017-07-21 DIAGNOSIS — M7989 Other specified soft tissue disorders: Secondary | ICD-10-CM | POA: Diagnosis present

## 2017-07-21 DIAGNOSIS — I482 Chronic atrial fibrillation, unspecified: Secondary | ICD-10-CM

## 2017-07-21 HISTORY — DX: Diverticulosis of intestine, part unspecified, without perforation or abscess without bleeding: K57.90

## 2017-07-21 HISTORY — DX: Presence of automatic (implantable) cardiac defibrillator: Z95.810

## 2017-07-21 HISTORY — DX: Atherosclerotic heart disease of native coronary artery without angina pectoris: I25.10

## 2017-07-21 NOTE — ED Provider Notes (Signed)
[place] DEPT Provider Note   CSN: [id number] Arrival date & time: 07/21/17  2153     History   Chief Complaint Chief Complaint  Patient presents with  . Leg Swelling    HPI Claire Kelley is a 83 y.o. female.  The history is provided by the patient and a relative.  She is here to obtain a venous Doppler to rule out DVT. One month ago, she fell suffering a fracture to her left fifth metatarsal, and multiple bruises on her left arm and leg. She was seen in orthopedic clinic yesterday, where orthopedic physician expressed some concern about possible DVT of the left leg and recommended venous Doppler. Of note, she has chronic atrial fibrillation and is anticoagulated on warfarin. She states her leg has been swollen ever since the original fall, and there is been no recent increase. She denies any pain in her calf or thigh.  Past Medical History:  Diagnosis Date  . A-fib ([place])   . AICD (automatic cardioverter/defibrillator) present   . Coronary artery disease   . Diverticulosis   . GERD (gastroesophageal reflux disease)   . GERD (gastroesophageal reflux disease)   . High cholesterol   . Hypertension   . Renal disorder    CKD    Patient Active Problem List   Diagnosis Date Noted  . Acute cystitis 05/29/2015  . Altered mental status 05/27/2015    Past Surgical History:  Procedure Laterality Date  . ABDOMINAL AORTIC ANEURYSM REPAIR    . CORONARY ARTERY BYPASS GRAFT    . PACEMAKER PLACEMENT      OB History    No data available       Home Medications    Prior to Admission medications   Medication Sig Start Date End Date Taking? Authorizing Provider  acetaminophen (TYLENOL) 650 MG CR tablet Take 1,300 mg by mouth every 8 (eight) hours as needed for pain.    [provider]  aspirin EC 81 MG tablet Take 81 mg by mouth daily.    [provider]  Cholecalciferol (VITAMIN D3) 1000 UNITS CAPS Take 1 capsule by mouth daily.    [provider]  clotrimazole-betamethasone (LOTRISONE) cream Apply 1 application topically 2 (two) times daily as needed (for irritation).    [provider]  CRANBERRY-VITAMIN C PO Take 1 capsule by mouth 2 (two) times daily.    [provider]  Iron-Vitamin C (VITRON-C PO) Take 1 tablet by mouth daily.    [provider]  Melatonin 1 MG TABS Take 1 tablet by mouth at bedtime.    [provider]  metoprolol succinate (TOPROL-XL) 100 MG 24 hr tablet Take 100 mg by mouth daily.    [provider]  oxybutynin (DITROPAN) 5 MG tablet Take 5 mg by mouth daily.    [provider]  pantoprazole (PROTONIX) 40 MG tablet Take 40 mg by mouth daily.    [provider]  rosuvastatin (CRESTOR) 5 MG tablet Take 5 mg by mouth daily.    [provider]  warfarin (COUMADIN) 1 MG tablet Take 1 mg by mouth See admin instructions. Takes 1 tablet everyday except Tuesdays and thursdays    [provider]  warfarin (COUMADIN) 2 MG tablet Take 1 tablet (2 mg total) by mouth daily at 6 PM. Patient taking differently: Take 2 mg by mouth See admin instructions. Tuesdays and thursdays 05/29/15   Gladstone Lighter, MD    Family History No family history on file.  Social  History Social History  Substance Use Topics  . Smoking status: Never Smoker  . Smokeless tobacco: Never Used  . Alcohol use No     Allergies   Bactrim [sulfamethoxazole-trimethoprim]; Ciprocinonide [fluocinolone]; Codeine; Lipitor [atorvastatin]; Macrobid [nitrofurantoin monohyd macro]; and Paxil [paroxetine hcl]   Review of Systems Review of Systems  All other systems reviewed and are negative.    Physical Exam Updated Vital Signs BP 116/73 (BP Location: Left Arm)   Pulse 63   Temp 98 F (36.7 C) (Oral)   Resp 18   Ht 5\' 9"  (1.753 m)   Wt 64.9 kg (143 lb)   SpO2 100%   BMI 21.12 kg/m   Physical Exam  Nursing note and vitals reviewed.  83 year old female,  resting comfortably and in no acute distress. Vital signs are normal. Oxygen saturation is 100%, which is normal. Head is normocephalic and atraumatic. PERRLA, EOMI. Oropharynx is clear. Neck is nontender and supple without adenopathy or JVD. Back is nontender and there is no CVA tenderness. Lungs are clear without rales, wheezes, or rhonchi. Chest is nontender. Heart has regular rate and rhythm without murmur. Abdomen is soft, flat, nontender without masses or hepatosplenomegaly and peristalsis is normoactive. Extremities have no cyanosis or edema, full range of motion is present. Circumference and thigh circumference are equal. Moderate discoloration of the left leg compared with the right, but both legs are warm and with prompt capillary refill, and strong pulses. Skin is warm and dry without rash. Neurologic: Mental status is normal, cranial nerves are intact, there are no motor or sensory deficits.  ED Treatments / Results   Procedures Procedures (including critical care time)  Medications Ordered in ED Medications - No data to display   Initial Impression / Assessment and Plan / ED Course  I have reviewed the triage vital signs and the nursing notes.  Patient sent from orthopedic clinic at [facility] for possible DVT. On my exam, no discrepancy in calf circumference or thigh circumference, no calf tenderness, no cords. No signs of DVT. Discoloration of left leg compared with right is felt to be due to known bruising from fall last month. Also, she is already treated appropriately for DVT with warfarin. Last several INRs have been therapeutic. I have offered patient the option of having venous Doppler done in the morning. However, given lack of clinical findings and already on appropriate treatment, patient has declined. She is discharged with instructions to continue taking her warfarin, and follow-up with her orthopedic surgeon and with her cardiologist.  Final Clinical  Impressions(s) / ED Diagnoses   Final diagnoses:  Leg swelling  Anticoagulated on warfarin  Chronic atrial fibrillation [facility])    New Prescriptions New Prescriptions   No medications on file     Delora Fuel, MD 41/58/30 0045

## 2017-07-21 NOTE — ED Triage Notes (Signed)
The pt fell July 9th  And she had a broken lt foot    Since that fall her entire lt leg has been swollen.  She was seen at [facility] yesterday and they were going to take an ultra sound of that leg but orders were mixded up and that never occurred.  The daughter at bedside feports that the lt leg has always been a little swollen since her cabg in 1997 when they took a  Vein from that leg for the surgery.  The pt has no pain at presenjt

## 2017-07-22 NOTE — Discharge Instructions (Signed)
Your exam today did not show any signs of a blood clot being present. Also, you are already on appropriate treatment for blood clots with the warfarin you are taking for atrial fibrillation. Please continue to follow up for periodic check of your INR, continue treatment of your injuries from your fall last month. Return if you have any problems.

## 2018-09-23 ENCOUNTER — Ambulatory Visit: Payer: Medicare Other

## 2018-09-23 ENCOUNTER — Other Ambulatory Visit: Payer: Self-pay

## 2018-09-23 ENCOUNTER — Encounter: Payer: Self-pay | Admitting: Gynecology

## 2018-09-23 ENCOUNTER — Ambulatory Visit
Admission: EM | Admit: 2018-09-23 | Discharge: 2018-09-23 | Disposition: A | Discharge status: Home / Self Care | Payer: Medicare Other | Attending: Family Medicine | Admitting: Family Medicine

## 2018-09-23 DIAGNOSIS — E7800 Pure hypercholesterolemia, unspecified: Secondary | ICD-10-CM | POA: Insufficient documentation

## 2018-09-23 DIAGNOSIS — J069 Acute upper respiratory infection, unspecified: Secondary | ICD-10-CM | POA: Diagnosis not present

## 2018-09-23 DIAGNOSIS — N189 Chronic kidney disease, unspecified: Secondary | ICD-10-CM | POA: Insufficient documentation

## 2018-09-23 DIAGNOSIS — K449 Diaphragmatic hernia without obstruction or gangrene: Secondary | ICD-10-CM | POA: Diagnosis not present

## 2018-09-23 DIAGNOSIS — Z7982 Long term (current) use of aspirin: Secondary | ICD-10-CM | POA: Diagnosis not present

## 2018-09-23 DIAGNOSIS — Z7901 Long term (current) use of anticoagulants: Secondary | ICD-10-CM | POA: Diagnosis not present

## 2018-09-23 DIAGNOSIS — I4891 Unspecified atrial fibrillation: Secondary | ICD-10-CM | POA: Insufficient documentation

## 2018-09-23 DIAGNOSIS — Z79899 Other long term (current) drug therapy: Secondary | ICD-10-CM | POA: Insufficient documentation

## 2018-09-23 DIAGNOSIS — I129 Hypertensive chronic kidney disease with stage 1 through stage 4 chronic kidney disease, or unspecified chronic kidney disease: Secondary | ICD-10-CM | POA: Insufficient documentation

## 2018-09-23 DIAGNOSIS — K219 Gastro-esophageal reflux disease without esophagitis: Secondary | ICD-10-CM | POA: Insufficient documentation

## 2018-09-23 DIAGNOSIS — I2510 Atherosclerotic heart disease of native coronary artery without angina pectoris: Secondary | ICD-10-CM | POA: Insufficient documentation

## 2018-09-23 DIAGNOSIS — R05 Cough: Secondary | ICD-10-CM | POA: Diagnosis present

## 2018-09-23 DIAGNOSIS — Z87891 Personal history of nicotine dependence: Secondary | ICD-10-CM | POA: Insufficient documentation

## 2018-09-23 DIAGNOSIS — Z951 Presence of aortocoronary bypass graft: Secondary | ICD-10-CM | POA: Insufficient documentation

## 2018-09-23 DIAGNOSIS — Z95810 Presence of automatic (implantable) cardiac defibrillator: Secondary | ICD-10-CM | POA: Insufficient documentation

## 2018-09-23 DIAGNOSIS — J029 Acute pharyngitis, unspecified: Secondary | ICD-10-CM | POA: Diagnosis present

## 2018-09-23 LAB — RAPID STREP SCREEN (MED CTR MEBANE ONLY): Streptococcus, Group A Screen (Direct): NEGATIVE

## 2018-09-23 LAB — RAPID INFLUENZA A&B ANTIGENS
Influenza A (ARMC): NEGATIVE
Influenza B (ARMC): NEGATIVE

## 2018-09-23 NOTE — ED Provider Notes (Signed)
[facility]    CSN: [id number] Arrival date & time: 09/23/18  1139     History   Chief Complaint Chief Complaint  Patient presents with  . Cough  . Sore Throat    HPI Claire Kelley is a 84 y.o. female.   The history is provided by the patient.  URI  Presenting symptoms: congestion, cough, fatigue and sore throat   Severity:  Moderate Onset quality:  Sudden Duration:  4 days Timing:  Constant Progression:  Unchanged Chronicity:  New Relieved by:  None tried Ineffective treatments:  None tried Associated symptoms: myalgias   Associated symptoms: no wheezing   Risk factors: being elderly, chronic cardiac disease, chronic kidney disease, chronic respiratory disease and sick contacts   Risk factors: no diabetes mellitus, no immunosuppression, no recent illness and no recent travel     Past Medical History:  Diagnosis Date  . A-fib ([place])   . AICD (automatic cardioverter/defibrillator) present   . Coronary artery disease   . Diverticulosis   . GERD (gastroesophageal reflux disease)   . GERD (gastroesophageal reflux disease)   . High cholesterol   . Hypertension   . Renal disorder    CKD    Patient Active Problem List   Diagnosis Date Noted  . Acute cystitis 05/29/2015  . Altered mental status 05/27/2015    Past Surgical History:  Procedure Laterality Date  . ABDOMINAL AORTIC ANEURYSM REPAIR    . CORONARY ARTERY BYPASS GRAFT    . PACEMAKER PLACEMENT      OB History   None      Home Medications    Prior to Admission medications   Medication Sig Start Date End Date Taking? Authorizing Provider  acetaminophen (TYLENOL) 650 MG CR tablet Take 1,300 mg by mouth every 8 (eight) hours as needed for pain.   Yes [provider]  aspirin EC 81 MG tablet Take 81 mg by mouth daily.   Yes [provider]  Cholecalciferol 1000 units capsule Take by mouth. 05/19/09  Yes [provider]  clobetasol cream (TEMOVATE) 0.05 % Apply  topically. 05/09/18 05/09/19 Yes [provider]  clotrimazole-betamethasone (LOTRISONE) cream Apply 1 application topically 2 (two) times daily as needed (for irritation).   Yes [provider]  CRANBERRY-VITAMIN C PO Take 1 capsule by mouth 2 (two) times daily.   Yes [provider]  Cranberry-Vitamin C-Vitamin E (CRANBERRY CONCENTRATE) 140-100-3 MG-MG-UNIT CAPS Take by mouth.   Yes [provider]  donepezil (ARICEPT) 5 MG tablet Take by mouth. 08/16/18  Yes [provider]  Iron-Vitamin C (IRON 100/C) 100-250 MG TABS Take by mouth.   Yes [provider]  magnesium gluconate (MAGONATE) 500 MG tablet Take 250 mg by mouth 2 (two) times daily.   Yes [provider]  Melatonin 1 MG TABS Take 1 tablet by mouth at bedtime.   Yes [provider]  Melatonin 1 MG TABS Take by mouth.   Yes [provider]  metoprolol succinate (TOPROL-XL) 100 MG 24 hr tablet Take 100 mg by mouth daily.   Yes [provider]  metoprolol succinate (TOPROL-XL) 100 MG 24 hr tablet 50 mg.  07/09/17  Yes [provider]  oxybutynin (DITROPAN-XL) 5 MG 24 hr tablet Take by mouth. 04/23/17  Yes [provider]  pantoprazole (PROTONIX) 40 MG tablet Take 40 mg by mouth daily.   Yes [provider]  pantoprazole (PROTONIX) 40 MG tablet TAKE ONE TABLET BY MOUTH ONCE DAILY 07/23/17  Yes [provider]  rosuvastatin (CRESTOR) 5 MG tablet Take by mouth. 04/30/17  Yes [provider]  senna (SENOKOT) 8.6 MG tablet Take by mouth.   Yes [provider]  vitamin B-12 (CYANOCOBALAMIN) 100 MCG tablet Take 100 mcg by mouth daily.   Yes [provider]  warfarin (COUMADIN) 1 MG tablet Take 1 mg by mouth See admin instructions. Takes 1 tablet everyday except Tuesdays and thursdays   Yes [provider]  warfarin (COUMADIN) 1 MG tablet Take two tablets two days a week and one tablet five days a  week 06/18/17  Yes [provider]  warfarin (COUMADIN) 2 MG tablet Take 1 tablet (2 mg total) by mouth daily at 6 PM. Patient taking differently: Take 2 mg by mouth See admin instructions. Tuesdays and thursdays 05/29/15  Yes Gladstone Lighter, MD  acetaminophen (TYLENOL) 650 MG CR tablet Take by mouth. 11/16/10   [provider]  Cholecalciferol (VITAMIN D3) 1000 UNITS CAPS Take 1 capsule by mouth daily.    [provider]  Iron-Vitamin C (VITRON-C PO) Take 1 tablet by mouth daily.    [provider]  oxybutynin (DITROPAN) 5 MG tablet Take 5 mg by mouth daily.    [provider]  rosuvastatin (CRESTOR) 5 MG tablet Take 5 mg by mouth daily.    [provider]    Family History History reviewed. No pertinent family history.  Social History Social History   Tobacco Use  . Smoking status: Former Research scientist (life sciences)  . Smokeless tobacco: Never Used  Substance Use Topics  . Alcohol use: No  . Drug use: Not on file     Allergies   Bactrim [sulfamethoxazole-trimethoprim]; Ciprocinonide [fluocinolone]; Ciprofloxacin; Codeine; Lipitor [atorvastatin]; Macrobid [nitrofurantoin monohyd macro]; and Paxil [paroxetine hcl]   Review of Systems Review of Systems  Constitutional: Positive for fatigue.  HENT: Positive for congestion and sore throat.   Respiratory: Positive for cough. Negative for wheezing.   Musculoskeletal: Positive for myalgias.     Physical Exam Triage Vital Signs ED Triage Vitals  Enc Vitals Group     BP 09/23/18 1152 129/84     Pulse Rate 09/23/18 1152 75     Resp 09/23/18 1152 16     Temp 09/23/18 1152 98.2 F (36.8 C)     Temp Source 09/23/18 1152 Oral     SpO2 09/23/18 1152 97 %     Weight 09/23/18 1155 139 lb (63 kg)     Height 09/23/18 1155 5\' 7"  (1.702 m)     Head Circumference --      Peak Flow --      Pain Score 09/23/18 1155 0     Pain Loc --      Pain Edu? --      Excl. in [place]? --    No data  found.  Updated Vital Signs BP 129/84 (BP Location: Left Arm)   Pulse 75   Temp 98.2 F (36.8 C) (Oral)   Resp 16   Ht 5\' 7"  (1.702 m)   Wt 63 kg   SpO2 97%   BMI 21.77 kg/m   Visual Acuity Right Eye Distance:   Left Eye Distance:   Bilateral Distance:    Right Eye Near:   Left Eye Near:    Bilateral Near:     Physical Exam  Constitutional: She appears well-developed and well-nourished. No distress.  HENT:  Head: Normocephalic and atraumatic.  Right Ear: Tympanic membrane, external ear and ear canal  normal.  Left Ear: Tympanic membrane, external ear and ear canal normal.  Nose: Mucosal edema and rhinorrhea present. No nose lacerations, sinus tenderness, nasal deformity, septal deviation or nasal septal hematoma. No epistaxis.  No foreign bodies.  Mouth/Throat: Uvula is midline, oropharynx is clear and moist and mucous membranes are normal. No oropharyngeal exudate.  Eyes: Conjunctivae are normal. Right eye exhibits no discharge. Left eye exhibits no discharge. No scleral icterus.  Neck: Normal range of motion. Neck supple. No thyromegaly present.  Cardiovascular: Normal rate, regular rhythm and normal heart sounds.  Pulmonary/Chest: Effort normal and breath sounds normal. No stridor. No respiratory distress. She has no wheezes. She has no rales.  Lymphadenopathy:    She has no cervical adenopathy.  Skin: She is not diaphoretic.  Nursing note and vitals reviewed.    UC Treatments / Results  Labs (all labs ordered are listed, but only abnormal results are displayed) Labs Reviewed  RAPID INFLUENZA A&B ANTIGENS ([facility] ONLY)  RAPID STREP SCREEN ([facility] ONLY)  CULTURE, GROUP A STREP [facility])    EKG None  Radiology Dg Chest 2 View  Result Date: 09/23/2018 CLINICAL DATA:  Cough EXAM: CHEST - 2 VIEW COMPARISON:  05/27/2015 FINDINGS: Large hiatal hernia. Hyperinflation of the lungs. Heart is mildly enlarged. Left pacer remains in place, unchanged. Prior CABG. No  effusions or acute bony abnormality. IMPRESSION: Hyperinflation.  Mild cardiomegaly.  No active disease. Electronically Signed   By: Rolm Baptise M.D.   On: 09/23/2018 12:46    Procedures Procedures (including critical care time)  Medications Ordered in UC Medications - No data to display  Initial Impression / Assessment and Plan / UC Course  I have reviewed the triage vital signs and the nursing notes.  Pertinent labs & imaging results that were available during my care of the patient were reviewed by me and considered in my medical decision making (see chart for details).      Final Clinical Impressions(s) / UC Diagnoses   Final diagnoses:  Viral URI     Discharge Instructions     Rest, increase fluids, over the counter cold/flu medication    ED Prescriptions    None     1. Labs/x-ray results and diagnosis reviewed with patient 2. Recommend supportive treatment as above  3. Follow-up prn if symptoms worsen or don't improve   Controlled Substance Prescriptions [place] Controlled Substance Registry consulted? Not Applicable   Norval Gable, MD 09/23/18 [phone]

## 2018-09-23 NOTE — ED Triage Notes (Signed)
Per daughter , mom not feeling well . Per patient with sore throat at night / cough . Per daughter ask to check her mom for FLU

## 2018-09-23 NOTE — Discharge Instructions (Signed)
Rest, increase fluids, over the counter cold/flu medication

## 2018-09-26 LAB — CULTURE, GROUP A STREP (THRC)

## 2018-12-28 ENCOUNTER — Other Ambulatory Visit: Payer: Self-pay | Admitting: Internal Medicine

## 2018-12-28 DIAGNOSIS — Z780 Asymptomatic menopausal state: Secondary | ICD-10-CM

## 2019-02-14 ENCOUNTER — Ambulatory Visit
Admission: RE | Admit: 2019-02-14 | Discharge: 2019-02-14 | Disposition: A | Discharge status: Home / Self Care | Payer: Medicare Other | Source: Ambulatory Visit | Attending: Internal Medicine | Admitting: Internal Medicine

## 2019-02-14 DIAGNOSIS — Z780 Asymptomatic menopausal state: Secondary | ICD-10-CM

## 2021-01-16 ENCOUNTER — Emergency Department: Payer: Medicare Other

## 2021-01-16 ENCOUNTER — Other Ambulatory Visit: Payer: Self-pay

## 2021-01-16 ENCOUNTER — Emergency Department
Admission: EM | Admit: 2021-01-16 | Discharge: 2021-01-16 | Disposition: A | Discharge status: Home / Self Care | Payer: Medicare Other | Attending: Emergency Medicine | Admitting: Emergency Medicine

## 2021-01-16 DIAGNOSIS — I25810 Atherosclerosis of coronary artery bypass graft(s) without angina pectoris: Secondary | ICD-10-CM | POA: Diagnosis not present

## 2021-01-16 DIAGNOSIS — W07XXXA Fall from chair, initial encounter: Secondary | ICD-10-CM | POA: Diagnosis not present

## 2021-01-16 DIAGNOSIS — S0083XA Contusion of other part of head, initial encounter: Secondary | ICD-10-CM

## 2021-01-16 DIAGNOSIS — Z87891 Personal history of nicotine dependence: Secondary | ICD-10-CM | POA: Insufficient documentation

## 2021-01-16 DIAGNOSIS — I2510 Atherosclerotic heart disease of native coronary artery without angina pectoris: Secondary | ICD-10-CM | POA: Insufficient documentation

## 2021-01-16 DIAGNOSIS — I4891 Unspecified atrial fibrillation: Secondary | ICD-10-CM | POA: Diagnosis not present

## 2021-01-16 DIAGNOSIS — Z7901 Long term (current) use of anticoagulants: Secondary | ICD-10-CM | POA: Insufficient documentation

## 2021-01-16 DIAGNOSIS — I1310 Hypertensive heart and chronic kidney disease without heart failure, with stage 1 through stage 4 chronic kidney disease, or unspecified chronic kidney disease: Secondary | ICD-10-CM | POA: Insufficient documentation

## 2021-01-16 DIAGNOSIS — N189 Chronic kidney disease, unspecified: Secondary | ICD-10-CM | POA: Insufficient documentation

## 2021-01-16 DIAGNOSIS — Z7982 Long term (current) use of aspirin: Secondary | ICD-10-CM | POA: Insufficient documentation

## 2021-01-16 DIAGNOSIS — S61412A Laceration without foreign body of left hand, initial encounter: Secondary | ICD-10-CM

## 2021-01-16 DIAGNOSIS — Z79899 Other long term (current) drug therapy: Secondary | ICD-10-CM | POA: Diagnosis not present

## 2021-01-16 DIAGNOSIS — Y93G3 Activity, cooking and baking: Secondary | ICD-10-CM | POA: Insufficient documentation

## 2021-01-16 DIAGNOSIS — S0990XA Unspecified injury of head, initial encounter: Secondary | ICD-10-CM

## 2021-01-16 DIAGNOSIS — Z950 Presence of cardiac pacemaker: Secondary | ICD-10-CM | POA: Diagnosis not present

## 2021-01-16 MED ORDER — BACITRACIN ZINC 500 UNIT/GM EX OINT
1.0000 "application " | TOPICAL_OINTMENT | Freq: Once | CUTANEOUS | Status: AC
  Administered 2021-01-16: 1 via TOPICAL
  Filled 2021-01-16: qty 0.9

## 2021-01-16 NOTE — ED Provider Notes (Signed)
[facility] Emergency Department Provider Note  ____________________________________________  Time seen: Approximately 3:04 PM  I have reviewed the triage vital signs and the nursing notes.   HISTORY  Chief Complaint Fall   HPI Claire Kelley is a 87 y.o. female who presents the to the emergency department for treatment and evaluation after fall. She was getting up to check on her french fries that were cooking and next thing she knew she was in the floor. She denies loss of consciousness, blurred vision, or headache. She had a little nausea earlier, but she believes that the pizza she ate wasn't cooked all the way through. She denies neck, hip or back pain.   Past Medical History:  Diagnosis Date  . A-fib ([place])   . AICD (automatic cardioverter/defibrillator) present   . Coronary artery disease   . Diverticulosis   . GERD (gastroesophageal reflux disease)   . GERD (gastroesophageal reflux disease)   . High cholesterol   . Hypertension   . Renal disorder    CKD    Patient Active Problem List   Diagnosis Date Noted  . Acute cystitis 05/29/2015  . Altered mental status 05/27/2015    Past Surgical History:  Procedure Laterality Date  . ABDOMINAL AORTIC ANEURYSM REPAIR    . CORONARY ARTERY BYPASS GRAFT    . PACEMAKER PLACEMENT      Prior to Admission medications   Medication Sig Start Date End Date Taking? Authorizing Provider  acetaminophen (TYLENOL) 650 MG CR tablet Take 1,300 mg by mouth every 8 (eight) hours as needed for pain.    [provider]  acetaminophen (TYLENOL) 650 MG CR tablet Take by mouth. 11/16/10   [provider]  aspirin EC 81 MG tablet Take 81 mg by mouth daily.    [provider]  Cholecalciferol (VITAMIN D3) 1000 UNITS CAPS Take 1 capsule by mouth daily.    [provider]  Cholecalciferol 1000 units capsule Take by mouth. 05/19/09   [provider]  clotrimazole-betamethasone  (LOTRISONE) cream Apply 1 application topically 2 (two) times daily as needed (for irritation).    [provider]  CRANBERRY-VITAMIN C PO Take 1 capsule by mouth 2 (two) times daily.    [provider]  Cranberry-Vitamin C-Vitamin E (CRANBERRY CONCENTRATE) 140-100-3 MG-MG-UNIT CAPS Take by mouth.    [provider]  donepezil (ARICEPT) 5 MG tablet Take by mouth. 08/16/18   [provider]  Iron-Vitamin C (IRON 100/C) 100-250 MG TABS Take by mouth.    [provider]  Iron-Vitamin C (VITRON-C PO) Take 1 tablet by mouth daily.    [provider]  magnesium gluconate (MAGONATE) 500 MG tablet Take 250 mg by mouth 2 (two) times daily.    [provider]  Melatonin 1 MG TABS Take 1 tablet by mouth at bedtime.    [provider]  Melatonin 1 MG TABS Take by mouth.    [provider]  metoprolol succinate (TOPROL-XL) 100 MG 24 hr tablet Take 100 mg by mouth daily.    [provider]  metoprolol succinate (TOPROL-XL) 100 MG 24 hr tablet 50 mg.  07/09/17   [provider]  oxybutynin (DITROPAN) 5 MG tablet Take 5 mg by mouth daily.    [provider]  oxybutynin (DITROPAN-XL) 5 MG 24 hr tablet Take by mouth. 04/23/17   [provider]  pantoprazole (PROTONIX) 40 MG tablet Take 40 mg by mouth daily.    [provider]  pantoprazole (PROTONIX) 40 MG tablet TAKE ONE TABLET BY MOUTH ONCE DAILY 07/23/17   [provider]  rosuvastatin (CRESTOR) 5 MG tablet Take 5 mg by mouth daily.    [provider]  rosuvastatin (CRESTOR) 5 MG tablet Take by mouth. 04/30/17   [provider]  senna (SENOKOT) 8.6 MG tablet Take by mouth.    [provider]  vitamin B-12 (CYANOCOBALAMIN) 100 MCG tablet Take 100 mcg by mouth daily.    [provider]  warfarin (COUMADIN) 1 MG tablet Take 1 mg by mouth See admin instructions. Takes 1 tablet everyday except Tuesdays and  thursdays    [provider]  warfarin (COUMADIN) 1 MG tablet Take two tablets two days a week and one tablet five days a week 06/18/17   [provider]  warfarin (COUMADIN) 2 MG tablet Take 1 tablet (2 mg total) by mouth daily at 6 PM. Patient taking differently: Take 2 mg by mouth See admin instructions. Tuesdays and thursdays 05/29/15   Gladstone Lighter, MD    Allergies Bactrim [sulfamethoxazole-trimethoprim], Ciprocinonide [fluocinolone], Ciprofloxacin, Codeine, Lipitor [atorvastatin], Macrobid [nitrofurantoin monohyd macro], and Paxil [paroxetine hcl]  No family history on file.  Social History Social History   Tobacco Use  . Smoking status: Former Research scientist (life sciences)  . Smokeless tobacco: Never Used  Substance Use Topics  . Alcohol use: No    Review of Systems  Constitutional: Negative for fever. Respiratory: Negative for cough or shortness of breath.  Musculoskeletal: Negative for myalgias Skin: Positive for facial hematoma and skin tear on left hand. Neurological: Negative for numbness or paresthesias. ____________________________________________   PHYSICAL EXAM:  Today's Vitals   01/16/21 1528  BP: 116/61  Pulse: 70  Resp: 16  Temp: 98.4 F (36.9 C)  TempSrc: Oral  SpO2: 96%   There is no height or weight on file to calculate BMI.    Constitutional: Overall well appearing. Eyes: Conjunctivae are clear without discharge or drainage. Nose: No rhinorrhea noted. Mouth/Throat: Airway is patent.  Neck: No stridor. Unrestricted range of motion observed. Cardiovascular: Capillary refill is <3 seconds.  Respiratory: Respirations are even and unlabored.. Musculoskeletal: Unrestricted range of motion observed. Neurologic: Awake, alert, and oriented x 4.  Skin: Hematoma over the left eyebrow and forehead. 2cm superficial skin tear on dorsal left hand without active bleeding.  ____________________________________________   LABS (all labs ordered are  listed, but only abnormal results are displayed)  Labs Reviewed - No data to display ____________________________________________  EKG  Not indicated. ____________________________________________  RADIOLOGY  CT head and cervical spine are negative for acute concern. ____________________________________________   PROCEDURES  Procedures ____________________________________________   INITIAL IMPRESSION / ASSESSMENT AND PLAN / ED COURSE  Claire Kelley is a 87 y.o. female presents to the emergency department for treatment and evaluation after non-syncopal fall. See HPI for details.   CT head and cervical spine show no acute findings. She remains alert and oriented. Skin tear to be cleaned and dressed prior to discharge. Head injury instructions given. Patient and family advised of recommendation for her not to be left alone for the next 24 hours. Return precautions discussed.    Medications  bacitracin ointment 1 application (has no administration in time range)     Pertinent labs & imaging results that were available during my care of the patient were reviewed by me and considered in my medical decision making (see chart for details).  ____________________________________________   FINAL CLINICAL IMPRESSION(S) / ED DIAGNOSES  Final diagnoses:  Facial  hematoma, initial encounter  Skin tear of left hand without complication, initial encounter  Minor head injury, initial encounter    ED Discharge Orders    None       Note:  This document was prepared using Dragon voice recognition software and may include unintentional dictation errors.   Victorino Dike, FNP 01/16/21 1553    Nance Pear, MD 01/16/21 [phone]

## 2021-01-16 NOTE — ED Triage Notes (Signed)
Pt presents via POV s/p fall. Denies LOC. Reports falling out of chair into the floor. On blood thinners per report. Reports hitting head. Also report laceration to left hand.

## 2021-10-08 ENCOUNTER — Emergency Department
Admission: EM | Admit: 2021-10-08 | Discharge: 2021-10-08 | Disposition: A | Discharge status: Home / Self Care | Payer: Medicare Other | Source: Home / Self Care | Attending: Emergency Medicine | Admitting: Emergency Medicine

## 2021-10-08 ENCOUNTER — Emergency Department: Payer: Medicare Other

## 2021-10-08 ENCOUNTER — Other Ambulatory Visit: Payer: Self-pay

## 2021-10-08 DIAGNOSIS — Z87891 Personal history of nicotine dependence: Secondary | ICD-10-CM | POA: Insufficient documentation

## 2021-10-08 DIAGNOSIS — I10 Essential (primary) hypertension: Secondary | ICD-10-CM | POA: Insufficient documentation

## 2021-10-08 DIAGNOSIS — W19XXXA Unspecified fall, initial encounter: Secondary | ICD-10-CM | POA: Insufficient documentation

## 2021-10-08 DIAGNOSIS — S2242XA Multiple fractures of ribs, left side, initial encounter for closed fracture: Secondary | ICD-10-CM | POA: Insufficient documentation

## 2021-10-08 DIAGNOSIS — T1490XA Injury, unspecified, initial encounter: Secondary | ICD-10-CM

## 2021-10-08 LAB — CBC
HCT: 33.2 % — ABNORMAL LOW (ref 36.0–46.0)
Hemoglobin: 11.3 g/dL — ABNORMAL LOW (ref 12.0–15.0)
MCH: 34.7 pg — ABNORMAL HIGH (ref 26.0–34.0)
MCHC: 34 g/dL (ref 30.0–36.0)
MCV: 101.8 fL — ABNORMAL HIGH (ref 80.0–100.0)
Platelets: 146 10*3/uL — ABNORMAL LOW (ref 150–400)
RBC: 3.26 MIL/uL — ABNORMAL LOW (ref 3.87–5.11)
RDW: 13.5 % (ref 11.5–15.5)
WBC: 7.8 10*3/uL (ref 4.0–10.5)
nRBC: 0 % (ref 0.0–0.2)

## 2021-10-08 LAB — COMPREHENSIVE METABOLIC PANEL
ALT: 16 U/L (ref 0–44)
AST: 18 U/L (ref 15–41)
Albumin: 3.5 g/dL (ref 3.5–5.0)
Alkaline Phosphatase: 81 U/L (ref 38–126)
Anion gap: 7 (ref 5–15)
BUN: 32 mg/dL — ABNORMAL HIGH (ref 8–23)
CO2: 25 mmol/L (ref 22–32)
Calcium: 9 mg/dL (ref 8.9–10.3)
Chloride: 104 mmol/L (ref 98–111)
Creatinine, Ser: 2.18 mg/dL — ABNORMAL HIGH (ref 0.44–1.00)
GFR, Estimated: 21 mL/min — ABNORMAL LOW (ref >60)
Glucose, Bld: 122 mg/dL — ABNORMAL HIGH (ref 70–99)
Potassium: 4.3 mmol/L (ref 3.5–5.1)
Sodium: 136 mmol/L (ref 135–145)
Total Bilirubin: 1 mg/dL (ref 0.3–1.2)
Total Protein: 6.5 g/dL (ref 6.5–8.1)

## 2021-10-08 LAB — PROTIME-INR
INR: 1.3 — ABNORMAL HIGH (ref 0.8–1.2)
Prothrombin Time: 16.2 seconds — ABNORMAL HIGH (ref 11.4–15.2)

## 2021-10-08 LAB — APTT: aPTT: 37 seconds — ABNORMAL HIGH (ref 24–36)

## 2021-10-08 MED ORDER — HYDROCODONE-ACETAMINOPHEN 5-325 MG PO TABS
1.0000 | ORAL_TABLET | Freq: Once | ORAL | Status: AC
  Administered 2021-10-08: 1 via ORAL
  Filled 2021-10-08: qty 1

## 2021-10-08 MED ORDER — MORPHINE SULFATE (PF) 4 MG/ML IV SOLN
4.0000 mg | Freq: Once | INTRAVENOUS | Status: AC
  Administered 2021-10-08: 4 mg via INTRAVENOUS
  Filled 2021-10-08: qty 1

## 2021-10-08 MED ORDER — HYDROCODONE-ACETAMINOPHEN 5-325 MG PO TABS: 1.0000 | ORAL_TABLET | Freq: Four times a day (QID) | ORAL | 0 refills | Status: DC | PRN

## 2021-10-08 MED ORDER — SODIUM CHLORIDE 0.9 % IV SOLN
1000.0000 mL | Freq: Once | INTRAVENOUS | Status: AC
  Administered 2021-10-08: 1000 mL via INTRAVENOUS

## 2021-10-08 MED ORDER — ONDANSETRON HCL 4 MG/2ML IJ SOLN
4.0000 mg | Freq: Once | INTRAMUSCULAR | Status: AC
  Administered 2021-10-08: 4 mg via INTRAVENOUS
  Filled 2021-10-08: qty 2

## 2021-10-08 MED ORDER — DULCOLAX 5 MG PO TBEC: 5.0000 mg | DELAYED_RELEASE_TABLET | Freq: Every day | ORAL | 1 refills | Status: AC | PRN

## 2021-10-08 NOTE — ED Notes (Signed)
Family took patient to hallway bathroom. Urine was not collected.

## 2021-10-08 NOTE — ED Notes (Signed)
Patient taken to CT.

## 2021-10-08 NOTE — ED Triage Notes (Signed)
Pt comes into the ED via EMS from home, lives alone, daughter visits her daily,  tripped on fell on Monday, pt has bruising and swelling to the left lateral from axillary to hip, having left wrist, left flank and back pain Ambulatory with assistance.. pt is on eliquis 92%RA 149/87 82HR

## 2021-10-08 NOTE — ED Provider Notes (Signed)
[facility] Emergency Department Provider Note   ____________________________________________    I have reviewed the triage vital signs and the nursing notes.   HISTORY  Chief Complaint Fall     HPI Claire Kelley is a 87 y.o. female with history of atrial fibrillation on Eliquis who presents after a fall that occurred 3 days ago.  Patient describes she also balance she tripped and fell.  Has had pain to her left lower chest and flank which has steadily worsened.  She reports it is much worse today.  No lower extremity injuries.  No upper extremity injuries.  No neck pain, no head injury.  Past Medical History:  Diagnosis Date   A-fib [facility])    AICD (automatic cardioverter/defibrillator) present    Coronary artery disease    Diverticulosis    GERD (gastroesophageal reflux disease)    GERD (gastroesophageal reflux disease)    High cholesterol    Hypertension    Renal disorder    CKD    Patient Active Problem List   Diagnosis Date Noted   Acute cystitis 05/29/2015   Altered mental status 05/27/2015    Past Surgical History:  Procedure Laterality Date   ABDOMINAL AORTIC ANEURYSM REPAIR     CORONARY ARTERY BYPASS GRAFT     PACEMAKER PLACEMENT      Prior to Admission medications   Medication Sig Start Date End Date Taking? Authorizing Provider  bisacodyl (DULCOLAX) 5 MG EC tablet Take 1 tablet (5 mg total) by mouth daily as needed for moderate constipation. 10/08/21 10/08/22 Yes Lavonia Drafts, MD  HYDROcodone-acetaminophen (NORCO/VICODIN) 5-325 MG tablet Take 1 tablet by mouth every 6 (six) hours as needed for severe pain. 10/08/21  Yes Lavonia Drafts, MD  acetaminophen (TYLENOL) 650 MG CR tablet Take 1,300 mg by mouth every 8 (eight) hours as needed for pain.    [provider]  acetaminophen (TYLENOL) 650 MG CR tablet Take by mouth. 11/16/10   [provider]  aspirin EC 81 MG tablet Take 81 mg by mouth daily.     [provider]  Cholecalciferol (VITAMIN D3) 1000 UNITS CAPS Take 1 capsule by mouth daily.    [provider]  Cholecalciferol 1000 units capsule Take by mouth. 05/19/09   [provider]  clotrimazole-betamethasone (LOTRISONE) cream Apply 1 application topically 2 (two) times daily as needed (for irritation).    [provider]  CRANBERRY-VITAMIN C PO Take 1 capsule by mouth 2 (two) times daily.    [provider]  Cranberry-Vitamin C-Vitamin E (CRANBERRY CONCENTRATE) 140-100-3 MG-MG-UNIT CAPS Take by mouth.    [provider]  donepezil (ARICEPT) 5 MG tablet Take by mouth. 08/16/18   [provider]  Iron-Vitamin C (IRON 100/C) 100-250 MG TABS Take by mouth.    [provider]  Iron-Vitamin C (VITRON-C PO) Take 1 tablet by mouth daily.    [provider]  magnesium gluconate (MAGONATE) 500 MG tablet Take 250 mg by mouth 2 (two) times daily.    [provider]  Melatonin 1 MG TABS Take 1 tablet by mouth at bedtime.    [provider]  Melatonin 1 MG TABS Take by mouth.    [provider]  metoprolol succinate (TOPROL-XL) 100 MG 24 hr tablet Take 100 mg by mouth daily.    [provider]  metoprolol succinate (TOPROL-XL) 100 MG 24 hr tablet 50 mg.  07/09/17   [provider]  oxybutynin (DITROPAN) 5 MG tablet  Take 5 mg by mouth daily.    [provider]  oxybutynin (DITROPAN-XL) 5 MG 24 hr tablet Take by mouth. 04/23/17   [provider]  pantoprazole (PROTONIX) 40 MG tablet Take 40 mg by mouth daily.    [provider]  pantoprazole (PROTONIX) 40 MG tablet TAKE ONE TABLET BY MOUTH ONCE DAILY 07/23/17   [provider]  rosuvastatin (CRESTOR) 5 MG tablet Take 5 mg by mouth daily.    [provider]  rosuvastatin (CRESTOR) 5 MG tablet Take by mouth. 04/30/17   [provider]  senna (SENOKOT) 8.6 MG tablet Take by mouth.     [provider]  vitamin B-12 (CYANOCOBALAMIN) 100 MCG tablet Take 100 mcg by mouth daily.    [provider]  warfarin (COUMADIN) 1 MG tablet Take 1 mg by mouth See admin instructions. Takes 1 tablet everyday except Tuesdays and thursdays    [provider]  warfarin (COUMADIN) 1 MG tablet Take two tablets two days a week and one tablet five days a week 06/18/17   [provider]  warfarin (COUMADIN) 2 MG tablet Take 1 tablet (2 mg total) by mouth daily at 6 PM. Patient taking differently: Take 2 mg by mouth See admin instructions. Tuesdays and thursdays 05/29/15   Gladstone Lighter, MD     Allergies Bactrim [sulfamethoxazole-trimethoprim], Ciprocinonide [fluocinolone], Ciprofloxacin, Codeine, Lipitor [atorvastatin], Macrobid [nitrofurantoin monohyd macro], and Paxil [paroxetine hcl]  No family history on file.  Social History Social History   Tobacco Use   Smoking status: Former   Smokeless tobacco: Never  Substance Use Topics   Alcohol use: No    Review of Systems  Constitutional: No fever/chills Eyes: No visual changes.  ENT: No sore throat. Cardiovascular: Chest pain Respiratory: Denies shortness of breath. Gastrointestinal: Left flank pain left Genitourinary: Negative for dysuria. Musculoskeletal: Negative for back pain. Skin: Hematoma Neurological: Negative for headaches or weakness   ____________________________________________   PHYSICAL EXAM:  VITAL SIGNS: ED Triage Vitals  Enc Vitals Group     BP 10/08/21 0943 107/62     Pulse Rate 10/08/21 0943 (!) 58     Resp 10/08/21 0943 14     Temp 10/08/21 0943 97.7 F (36.5 C)     Temp Source 10/08/21 0943 Oral     SpO2 10/08/21 0943 96 %     Weight 10/08/21 0944 61.2 kg (135 lb)     Height 10/08/21 0944 1.702 m (5\' 7" )     Head Circumference --      Peak Flow --      Pain Score 10/08/21 1344 0     Pain Loc --      Pain Edu? --      Excl. in [place]? --     Constitutional:  Alert and oriented.  Eyes: Conjunctivae are normal.  Head: Atraumatic. Nose: No congestion/rhinnorhea. Mouth/Throat: Mucous membranes are moist.   Neck:  Painless ROM, no pain with axial load, no vertebral terms palpation Cardiovascular: Normal rate, regular rhythm. Grossly normal heart sounds.  Good peripheral circulation.  Tenderness palpation left lower chest wall Respiratory: Normal respiratory effort.  No retractions. Lungs CTAB. Gastrointestinal: Soft, mild left upper quadrant tenderness.  Large hematoma extending over the left lateral lower rib cage and upper abdomen  Musculoskeletal: No pain with axial load on both hips, normal range of motion of the upper extremities, Neurologic:  Normal speech and language. No gross focal neurologic deficits are appreciated.  Skin:  Skin is warm,  dry and intact. No rash noted. Psychiatric: Mood and affect are normal. Speech and behavior are normal.  ____________________________________________   LABS (all labs ordered are listed, but only abnormal results are displayed)  Labs Reviewed  CBC - Abnormal; Notable for the following components:      Result Value   RBC 3.26 (*)    Hemoglobin 11.3 (*)    HCT 33.2 (*)    MCV 101.8 (*)    MCH 34.7 (*)    Platelets 146 (*)    All other components within normal limits  COMPREHENSIVE METABOLIC PANEL - Abnormal; Notable for the following components:   Glucose, Bld 122 (*)    BUN 32 (*)    Creatinine, Ser 2.18 (*)    GFR, Estimated 21 (*)    All other components within normal limits  APTT - Abnormal; Notable for the following components:   aPTT 37 (*)    All other components within normal limits  PROTIME-INR - Abnormal; Notable for the following components:   Prothrombin Time 16.2 (*)    INR 1.3 (*)    All other components within normal limits  URINALYSIS, COMPLETE (UACMP) WITH MICROSCOPIC    ____________________________________________  EKG  None ____________________________________________  RADIOLOGY  Chest x-ray viewed by me, multiple rib fractures ____________________________________________   PROCEDURES  Procedure(s) performed: No  Procedures   Critical Care performed: No ____________________________________________   INITIAL IMPRESSION / ASSESSMENT AND PLAN / ED COURSE  Pertinent labs & imaging results that were available during my care of the patient were reviewed by me and considered in my medical decision making (see chart for details).   Patient presents after fall 3 days ago as detailed above, she has significant bruising, she is on Eliquis.  X-rays consistent with multiple left-sided rib fractures.  She has worsening pain in the left lower chest and left upper abdomen.  We will send for CT chest abdomen pelvis.  Will treat with IV morphine, IV Zofran  Notified of BUN and creatinine ratio, patient will have to have Noncon scan  CT scan demonstrates rib fractures, no abdominal injury.  Patient's pain is well controlled.  Provide p.o. medication, incentive spirometer, outpatient follow-up, return precautions discussed.    ____________________________________________   FINAL CLINICAL IMPRESSION(S) / ED DIAGNOSES  Final diagnoses:  Trauma  Closed fracture of multiple ribs of left side, initial encounter        Note:  This document was prepared using Dragon voice recognition software and may include unintentional dictation errors.    Lavonia Drafts, MD 10/08/21 [phone]

## 2021-10-10 ENCOUNTER — Other Ambulatory Visit: Payer: Self-pay

## 2021-10-10 ENCOUNTER — Inpatient Hospital Stay
Admission: EM | Admit: 2021-10-10 | Discharge: 2021-10-15 | DRG: 871 | Disposition: A | Discharge status: Home Health | Payer: Medicare Other | Attending: Internal Medicine | Admitting: Internal Medicine

## 2021-10-10 ENCOUNTER — Encounter: Payer: Self-pay | Admitting: Emergency Medicine

## 2021-10-10 ENCOUNTER — Emergency Department: Payer: Medicare Other

## 2021-10-10 DIAGNOSIS — K219 Gastro-esophageal reflux disease without esophagitis: Secondary | ICD-10-CM | POA: Diagnosis present

## 2021-10-10 DIAGNOSIS — Z79899 Other long term (current) drug therapy: Secondary | ICD-10-CM | POA: Diagnosis not present

## 2021-10-10 DIAGNOSIS — N184 Chronic kidney disease, stage 4 (severe): Secondary | ICD-10-CM | POA: Insufficient documentation

## 2021-10-10 DIAGNOSIS — I2510 Atherosclerotic heart disease of native coronary artery without angina pectoris: Secondary | ICD-10-CM | POA: Diagnosis present

## 2021-10-10 DIAGNOSIS — Z95810 Presence of automatic (implantable) cardiac defibrillator: Secondary | ICD-10-CM

## 2021-10-10 DIAGNOSIS — I482 Chronic atrial fibrillation, unspecified: Secondary | ICD-10-CM | POA: Diagnosis present

## 2021-10-10 DIAGNOSIS — J189 Pneumonia, unspecified organism: Secondary | ICD-10-CM | POA: Diagnosis present

## 2021-10-10 DIAGNOSIS — J9601 Acute respiratory failure with hypoxia: Secondary | ICD-10-CM | POA: Diagnosis present

## 2021-10-10 DIAGNOSIS — Z20822 Contact with and (suspected) exposure to covid-19: Secondary | ICD-10-CM | POA: Diagnosis present

## 2021-10-10 DIAGNOSIS — S301XXA Contusion of abdominal wall, initial encounter: Secondary | ICD-10-CM | POA: Diagnosis present

## 2021-10-10 DIAGNOSIS — Z7901 Long term (current) use of anticoagulants: Secondary | ICD-10-CM | POA: Diagnosis not present

## 2021-10-10 DIAGNOSIS — W010XXA Fall on same level from slipping, tripping and stumbling without subsequent striking against object, initial encounter: Secondary | ICD-10-CM | POA: Diagnosis present

## 2021-10-10 DIAGNOSIS — Z881 Allergy status to other antibiotic agents status: Secondary | ICD-10-CM | POA: Diagnosis not present

## 2021-10-10 DIAGNOSIS — K5900 Constipation, unspecified: Secondary | ICD-10-CM | POA: Diagnosis not present

## 2021-10-10 DIAGNOSIS — S2242XD Multiple fractures of ribs, left side, subsequent encounter for fracture with routine healing: Secondary | ICD-10-CM

## 2021-10-10 DIAGNOSIS — I714 Abdominal aortic aneurysm, without rupture, unspecified: Secondary | ICD-10-CM | POA: Diagnosis present

## 2021-10-10 DIAGNOSIS — I10 Essential (primary) hypertension: Secondary | ICD-10-CM | POA: Diagnosis present

## 2021-10-10 DIAGNOSIS — I1310 Hypertensive heart and chronic kidney disease without heart failure, with stage 1 through stage 4 chronic kidney disease, or unspecified chronic kidney disease: Secondary | ICD-10-CM | POA: Diagnosis present

## 2021-10-10 DIAGNOSIS — Z8249 Family history of ischemic heart disease and other diseases of the circulatory system: Secondary | ICD-10-CM

## 2021-10-10 DIAGNOSIS — N1832 Chronic kidney disease, stage 3b: Secondary | ICD-10-CM | POA: Diagnosis present

## 2021-10-10 DIAGNOSIS — Z7982 Long term (current) use of aspirin: Secondary | ICD-10-CM | POA: Diagnosis not present

## 2021-10-10 DIAGNOSIS — S2242XA Multiple fractures of ribs, left side, initial encounter for closed fracture: Secondary | ICD-10-CM | POA: Diagnosis present

## 2021-10-10 DIAGNOSIS — J90 Pleural effusion, not elsewhere classified: Secondary | ICD-10-CM | POA: Diagnosis present

## 2021-10-10 DIAGNOSIS — F0390 Unspecified dementia without behavioral disturbance: Secondary | ICD-10-CM | POA: Diagnosis present

## 2021-10-10 DIAGNOSIS — Z885 Allergy status to narcotic agent status: Secondary | ICD-10-CM

## 2021-10-10 DIAGNOSIS — K449 Diaphragmatic hernia without obstruction or gangrene: Secondary | ICD-10-CM

## 2021-10-10 DIAGNOSIS — I4891 Unspecified atrial fibrillation: Secondary | ICD-10-CM | POA: Insufficient documentation

## 2021-10-10 DIAGNOSIS — N179 Acute kidney failure, unspecified: Secondary | ICD-10-CM | POA: Diagnosis present

## 2021-10-10 DIAGNOSIS — Z87891 Personal history of nicotine dependence: Secondary | ICD-10-CM | POA: Diagnosis not present

## 2021-10-10 DIAGNOSIS — A419 Sepsis, unspecified organism: Principal | ICD-10-CM | POA: Diagnosis present

## 2021-10-10 DIAGNOSIS — R109 Unspecified abdominal pain: Secondary | ICD-10-CM | POA: Diagnosis present

## 2021-10-10 DIAGNOSIS — S2232XA Fracture of one rib, left side, initial encounter for closed fracture: Secondary | ICD-10-CM | POA: Diagnosis present

## 2021-10-10 DIAGNOSIS — S2249XA Multiple fractures of ribs, unspecified side, initial encounter for closed fracture: Secondary | ICD-10-CM | POA: Diagnosis present

## 2021-10-10 DIAGNOSIS — Y92019 Unspecified place in single-family (private) house as the place of occurrence of the external cause: Secondary | ICD-10-CM

## 2021-10-10 DIAGNOSIS — E7800 Pure hypercholesterolemia, unspecified: Secondary | ICD-10-CM | POA: Diagnosis present

## 2021-10-10 DIAGNOSIS — Z951 Presence of aortocoronary bypass graft: Secondary | ICD-10-CM | POA: Diagnosis not present

## 2021-10-10 DIAGNOSIS — Z888 Allergy status to other drugs, medicaments and biological substances status: Secondary | ICD-10-CM

## 2021-10-10 DIAGNOSIS — N189 Chronic kidney disease, unspecified: Secondary | ICD-10-CM | POA: Diagnosis not present

## 2021-10-10 DIAGNOSIS — A4150 Gram-negative sepsis, unspecified: Secondary | ICD-10-CM | POA: Diagnosis not present

## 2021-10-10 LAB — COMPREHENSIVE METABOLIC PANEL
ALT: 13 U/L (ref 0–44)
AST: 20 U/L (ref 15–41)
Albumin: 3.5 g/dL (ref 3.5–5.0)
Alkaline Phosphatase: 81 U/L (ref 38–126)
Anion gap: 11 (ref 5–15)
BUN: 32 mg/dL — ABNORMAL HIGH (ref 8–23)
CO2: 26 mmol/L (ref 22–32)
Calcium: 9 mg/dL (ref 8.9–10.3)
Chloride: 98 mmol/L (ref 98–111)
Creatinine, Ser: 1.82 mg/dL — ABNORMAL HIGH (ref 0.44–1.00)
GFR, Estimated: 26 mL/min — ABNORMAL LOW (ref >60)
Glucose, Bld: 155 mg/dL — ABNORMAL HIGH (ref 70–99)
Potassium: 4.4 mmol/L (ref 3.5–5.1)
Sodium: 135 mmol/L (ref 135–145)
Total Bilirubin: 1 mg/dL (ref 0.3–1.2)
Total Protein: 6.8 g/dL (ref 6.5–8.1)

## 2021-10-10 LAB — RESP PANEL BY RT-PCR (FLU A&B, COVID) ARPGX2
Influenza A by PCR: NEGATIVE
Influenza B by PCR: NEGATIVE
SARS Coronavirus 2 by RT PCR: NEGATIVE

## 2021-10-10 LAB — LACTIC ACID, PLASMA: Lactic Acid, Venous: 1.1 mmol/L (ref 0.5–1.9)

## 2021-10-10 LAB — CBC
HCT: 35.4 % — ABNORMAL LOW (ref 36.0–46.0)
Hemoglobin: 11.8 g/dL — ABNORMAL LOW (ref 12.0–15.0)
MCH: 34.7 pg — ABNORMAL HIGH (ref 26.0–34.0)
MCHC: 33.3 g/dL (ref 30.0–36.0)
MCV: 104.1 fL — ABNORMAL HIGH (ref 80.0–100.0)
Platelets: 146 10*3/uL — ABNORMAL LOW (ref 150–400)
RBC: 3.4 MIL/uL — ABNORMAL LOW (ref 3.87–5.11)
RDW: 13.6 % (ref 11.5–15.5)
WBC: 15 10*3/uL — ABNORMAL HIGH (ref 4.0–10.5)
nRBC: 0 % (ref 0.0–0.2)

## 2021-10-10 LAB — LIPASE, BLOOD: Lipase: 54 U/L — ABNORMAL HIGH (ref 11–51)

## 2021-10-10 LAB — PROCALCITONIN: Procalcitonin: 0.37 ng/mL

## 2021-10-10 LAB — BRAIN NATRIURETIC PEPTIDE: B Natriuretic Peptide: 193.3 pg/mL — ABNORMAL HIGH (ref 0.0–100.0)

## 2021-10-10 MED ORDER — MORPHINE SULFATE (PF) 2 MG/ML IV SOLN
0.5000 mg | INTRAVENOUS | Status: DC | PRN
Start: 2021-10-10 — End: 2021-10-15
  Administered 2021-10-11 (×2): 0.5 mg via INTRAVENOUS
  Filled 2021-10-10 (×2): qty 1

## 2021-10-10 MED ORDER — ROSUVASTATIN CALCIUM 10 MG PO TABS
5.0000 mg | ORAL_TABLET | Freq: Every day | ORAL | Status: DC
  Administered 2021-10-10 – 2021-10-15 (×6): 5 mg via ORAL
  Filled 2021-10-10 (×6): qty 1

## 2021-10-10 MED ORDER — METOPROLOL SUCCINATE ER 25 MG PO TB24
25.0000 mg | ORAL_TABLET | Freq: Two times a day (BID) | ORAL | Status: DC
  Administered 2021-10-10 – 2021-10-11 (×3): 25 mg via ORAL
  Filled 2021-10-10 (×3): qty 1

## 2021-10-10 MED ORDER — SODIUM CHLORIDE 0.9 % IV SOLN
2.0000 g | INTRAVENOUS | Status: DC
  Administered 2021-10-11 – 2021-10-13 (×3): 2 g via INTRAVENOUS
  Filled 2021-10-10: qty 2
  Filled 2021-10-10 (×2): qty 20
  Filled 2021-10-10: qty 2

## 2021-10-10 MED ORDER — OXYBUTYNIN CHLORIDE ER 5 MG PO TB24
5.0000 mg | ORAL_TABLET | Freq: Every day | ORAL | Status: DC
  Administered 2021-10-10 – 2021-10-14 (×5): 5 mg via ORAL
  Filled 2021-10-10 (×6): qty 1

## 2021-10-10 MED ORDER — ONDANSETRON HCL 4 MG/2ML IJ SOLN
4.0000 mg | Freq: Three times a day (TID) | INTRAMUSCULAR | Status: DC | PRN
  Administered 2021-10-13 – 2021-10-14 (×2): 4 mg via INTRAVENOUS
  Filled 2021-10-10 (×2): qty 2

## 2021-10-10 MED ORDER — VITAMIN D 25 MCG (1000 UNIT) PO TABS
1000.0000 [IU] | ORAL_TABLET | Freq: Every day | ORAL | Status: DC
  Administered 2021-10-10 – 2021-10-15 (×6): 1000 [IU] via ORAL
  Filled 2021-10-10 (×6): qty 1

## 2021-10-10 MED ORDER — SENNOSIDES-DOCUSATE SODIUM 8.6-50 MG PO TABS
1.0000 | ORAL_TABLET | ORAL | Status: DC | PRN
  Administered 2021-10-12 – 2021-10-13 (×3): 1 via ORAL
  Filled 2021-10-10 (×3): qty 1

## 2021-10-10 MED ORDER — ACETAMINOPHEN 325 MG PO TABS: 650.0000 mg | ORAL_TABLET | Freq: Four times a day (QID) | ORAL | Status: DC | PRN

## 2021-10-10 MED ORDER — SODIUM CHLORIDE 0.9 % IV SOLN
1.0000 g | Freq: Once | INTRAVENOUS | Status: AC
  Administered 2021-10-10: 1 g via INTRAVENOUS
  Filled 2021-10-10: qty 10

## 2021-10-10 MED ORDER — MELATONIN 5 MG PO TABS
2.5000 mg | ORAL_TABLET | Freq: Every evening | ORAL | Status: DC | PRN
  Administered 2021-10-13 – 2021-10-14 (×2): 2.5 mg via ORAL
  Filled 2021-10-10 (×3): qty 0.5

## 2021-10-10 MED ORDER — SODIUM CHLORIDE 0.9 % IV BOLUS
500.0000 mL | Freq: Once | INTRAVENOUS | Status: AC
  Administered 2021-10-10: 500 mL via INTRAVENOUS

## 2021-10-10 MED ORDER — ONDANSETRON HCL 4 MG/2ML IJ SOLN
4.0000 mg | Freq: Once | INTRAMUSCULAR | Status: AC
  Administered 2021-10-10: 4 mg via INTRAVENOUS
  Filled 2021-10-10: qty 2

## 2021-10-10 MED ORDER — SODIUM CHLORIDE 0.9 % IV SOLN
500.0000 mg | Freq: Once | INTRAVENOUS | Status: AC
  Administered 2021-10-10: 500 mg via INTRAVENOUS
  Filled 2021-10-10: qty 500

## 2021-10-10 MED ORDER — SODIUM CHLORIDE 0.9 % IV SOLN
500.0000 mg | INTRAVENOUS | Status: DC
  Administered 2021-10-11 – 2021-10-13 (×3): 500 mg via INTRAVENOUS
  Filled 2021-10-10 (×4): qty 500

## 2021-10-10 MED ORDER — CRANBERRY 500 MG PO CAPS: 1.0000 | ORAL_CAPSULE | Freq: Every day | ORAL | Status: DC

## 2021-10-10 MED ORDER — ALBUTEROL SULFATE (2.5 MG/3ML) 0.083% IN NEBU: 3.0000 mL | INHALATION_SOLUTION | RESPIRATORY_TRACT | Status: DC | PRN

## 2021-10-10 MED ORDER — HYDRALAZINE HCL 20 MG/ML IJ SOLN: 5.0000 mg | INTRAMUSCULAR | Status: DC | PRN

## 2021-10-10 MED ORDER — ADULT MULTIVITAMIN W/MINERALS CH
1.0000 | ORAL_TABLET | Freq: Every day | ORAL | Status: DC
  Administered 2021-10-11 – 2021-10-15 (×5): 1 via ORAL
  Filled 2021-10-10 (×5): qty 1

## 2021-10-10 MED ORDER — SODIUM CHLORIDE 0.9 % IV SOLN: INTRAVENOUS | Status: DC

## 2021-10-10 MED ORDER — FERROUS SULFATE 325 (65 FE) MG PO TABS
ORAL_TABLET | Freq: Every day | ORAL | Status: DC
  Administered 2021-10-10 – 2021-10-15 (×6): 325 mg via ORAL
  Filled 2021-10-10 (×6): qty 1

## 2021-10-10 MED ORDER — DM-GUAIFENESIN ER 30-600 MG PO TB12: 1.0000 | ORAL_TABLET | Freq: Two times a day (BID) | ORAL | Status: DC | PRN

## 2021-10-10 MED ORDER — AMLODIPINE BESYLATE 5 MG PO TABS
2.5000 mg | ORAL_TABLET | Freq: Every day | ORAL | Status: DC
  Administered 2021-10-10 – 2021-10-11 (×2): 2.5 mg via ORAL
  Filled 2021-10-10 (×2): qty 1

## 2021-10-10 MED ORDER — OXYCODONE-ACETAMINOPHEN 5-325 MG PO TABS
1.0000 | ORAL_TABLET | ORAL | Status: DC | PRN
Start: 2021-10-10 — End: 2021-10-15
  Administered 2021-10-12 – 2021-10-14 (×5): 1 via ORAL
  Filled 2021-10-10 (×5): qty 1

## 2021-10-10 MED ORDER — PANTOPRAZOLE SODIUM 40 MG PO TBEC
40.0000 mg | DELAYED_RELEASE_TABLET | Freq: Every day | ORAL | Status: DC
  Administered 2021-10-10 – 2021-10-15 (×6): 40 mg via ORAL
  Filled 2021-10-10 (×6): qty 1

## 2021-10-10 MED ORDER — VITAMIN B-12 100 MCG PO TABS
100.0000 ug | ORAL_TABLET | Freq: Every day | ORAL | Status: DC
  Administered 2021-10-11 – 2021-10-15 (×5): 100 ug via ORAL
  Filled 2021-10-10 (×5): qty 1

## 2021-10-10 MED ORDER — FENTANYL CITRATE PF 50 MCG/ML IJ SOSY
50.0000 ug | PREFILLED_SYRINGE | Freq: Once | INTRAMUSCULAR | Status: AC
  Administered 2021-10-10: 50 ug via INTRAVENOUS
  Filled 2021-10-10: qty 1

## 2021-10-10 MED ORDER — DONEPEZIL HCL 5 MG PO TABS
5.0000 mg | ORAL_TABLET | Freq: Every day | ORAL | Status: DC
  Administered 2021-10-11 – 2021-10-15 (×5): 5 mg via ORAL
  Filled 2021-10-10 (×5): qty 1

## 2021-10-10 MED ORDER — AMIODARONE HCL 200 MG PO TABS
100.0000 mg | ORAL_TABLET | Freq: Every day | ORAL | Status: DC
  Administered 2021-10-10 – 2021-10-15 (×6): 100 mg via ORAL
  Filled 2021-10-10 (×6): qty 1

## 2021-10-10 NOTE — ED Triage Notes (Signed)
Patient to ED via [facility] from home for abd pain with N/V. Patient was seen here Friday for a fall and was diagnosis with 3 broken ribs at this time. Patient alert and oriented.

## 2021-10-10 NOTE — ED Notes (Signed)
Pt's O2 maintaining at 85% RA. Pt instructed to take slow deep breaths. No improvement. Good pleth viewed. Pt placed on 2L  at this time. O2 improved to 94%. FAmily at bedside

## 2021-10-10 NOTE — H&P (Signed)
History and Physical    Claire Kelley JSE:[id number] DOB: 08-08-1926 DOA: 10/10/2021  Referring MD/NP/PA:   PCP: Irish Elders, MD   Patient coming from:  The patient is coming from home.  At baseline, pt is independent for most of ADL.        Chief Complaint: Cough, shortness breath, nausea, vomiting, abdominal pain, left chest wall pain  HPI: Claire Kelley is a 87 y.o. female with medical history significant of hypertension, GERD, CKD-4, CAD, CABG, AICD, atrial fibrillation on Coumadin, diverticulosis, dementia, AAA, who presents with cough, shortness breath, nausea, vomiting, abdominal pain, left chest wall pain.  Patient fell on 10/17 at home and injured her left side of the chest, developed a big hematoma in the left flank area.  Patient was seen in ED on 10/08/2021, and found to have 5 rib fractures. Pt continues to have left-sided chest wall pain.  Patient also reports cough and mild shortness of breath, no fever or chills.  She has nausea, vomiting and abdominal pain.  No diarrhea.  She has vomited 6-7 times since yesterday with nonbilious nonbloody vomiting.  Her abdominal pain is located in the epigastric area , and also in right inguinal area which is possibly due to right inguinal hernia per her daughter. Patient reports increased urinary frequency, no dysuria or burning on urination.  No hematuria. Per her daughter, patient clearly said that she will never consider surgical repair of her AAA.  She will never consider dialysis.  ED Course: pt was found to have WBC 15.0, INR 1.3, negative COVID PCR, pending lactic acid, pending urinalysis, renal function close to baseline, temperature normal, blood pressure 138/66, heart rate 59, RR 23, oxygen saturation 85% on room air, which improved to 98% on 2 L oxygen (patient is not using oxygen normally at home.  Patient is admitted to progressive bed as inpatient.  CXR: 1. No pneumothorax, but the left pleural effusion following rib  fractures has increased since 10/08/2021, now small to moderate. 2. New patchy right lung base opacity, favor atelectasis.   CT-abdomen/pelvis: 1. No acute findings identified within the abdomen or pelvis. 2. There is progressive airspace disease within the left lower lobe with new postinflammatory changes noted in the right middle lobe and right lower lobe. Imaging findings are concerning for multifocal pneumonia. 3. Large hiatal hernia with approximately 50% intrathoracic stomach. Diffuse distension of the intrathoracic and intra-stomach is identified. 4. Similar appearance of large infrarenal abdominal aortic aneurysm status post stent graft repair. The aneurysm sac measures 8.5 x 6.4 cm. As mention previously endoleak cannot be excluded on unenhanced study. Correlation with previous imaging at an outside location or contrast enhanced exam is advised. 5. Left inguinal hernia contains a nonobstructed loop of small bowel. 6. Multiple subacute left rib fractures are again seen. 7. Nonobstructing right renal calculus. 8. Aortic Atherosclerosis (ICD10-I70.0).   Review of Systems:   General: no fevers, chills, no body weight gain, has poor appetite, has fatigue HEENT: no blurry vision, hearing changes or sore throat Respiratory: has dyspnea, coughing, no wheezing CV: has chest wall pain, no palpitations GI: has nausea, vomiting, abdominal pain, no diarrhea, constipation GU: no dysuria, burning on urination, has increased urinary frequency, no hematuria  Ext: no leg edema Neuro: no unilateral weakness, numbness, or tingling, no vision change or hearing loss Skin: no rash, no skin tear. Has a large hematoma in left flank area MSK: No muscle spasm, no deformity, no limitation of range of movement in spin Heme:  No easy bruising.  Travel history: No recent long distant travel.  Allergy:  Allergies  Allergen Reactions   Bactrim [Sulfamethoxazole-Trimethoprim] Other (See Comments)     Reaction:  Unknown    Ciprocinonide [Fluocinolone] Other (See Comments)    Reaction:  Unknown    Ciprofloxacin Other (See Comments)   Codeine    Lipitor [Atorvastatin] Other (See Comments)    Reaction:  Leg cramps    Macrobid [Nitrofurantoin Monohyd Macro] Other (See Comments)    Reaction:  Unknown    Paxil [Paroxetine Hcl] Other (See Comments)    Reaction:  Made pt "loopy"    Past Medical History:  Diagnosis Date   A-fib ([place])    AICD (automatic cardioverter/defibrillator) present    Coronary artery disease    Diverticulosis    GERD (gastroesophageal reflux disease)    GERD (gastroesophageal reflux disease)    High cholesterol    Hypertension    Renal disorder    CKD    Past Surgical History:  Procedure Laterality Date   ABDOMINAL AORTIC ANEURYSM REPAIR     CORONARY ARTERY BYPASS GRAFT     PACEMAKER PLACEMENT      Social History:  reports that she has quit smoking. She has never used smokeless tobacco. She reports that she does not drink alcohol and does not use drugs.  Family History:  Family History  Problem Relation Age of Onset   Heart disease Sister    Heart disease Brother      Prior to Admission medications   Medication Sig Start Date End Date Taking? Authorizing Provider  acetaminophen (TYLENOL) 650 MG CR tablet Take 1,300 mg by mouth every 8 (eight) hours as needed for pain.    [provider]  acetaminophen (TYLENOL) 650 MG CR tablet Take by mouth. 11/16/10   [provider]  aspirin EC 81 MG tablet Take 81 mg by mouth daily.    [provider]  bisacodyl (DULCOLAX) 5 MG EC tablet Take 1 tablet (5 mg total) by mouth daily as needed for moderate constipation. 10/08/21 10/08/22  Lavonia Drafts, MD  Cholecalciferol (VITAMIN D3) 1000 UNITS CAPS Take 1 capsule by mouth daily.    [provider]  Cholecalciferol 1000 units capsule Take by mouth. 05/19/09   [provider]  clotrimazole-betamethasone (LOTRISONE) cream  Apply 1 application topically 2 (two) times daily as needed (for irritation).    [provider]  CRANBERRY-VITAMIN C PO Take 1 capsule by mouth 2 (two) times daily.    [provider]  Cranberry-Vitamin C-Vitamin E (CRANBERRY CONCENTRATE) 140-100-3 MG-MG-UNIT CAPS Take by mouth.    [provider]  donepezil (ARICEPT) 5 MG tablet Take by mouth. 08/16/18   [provider]  HYDROcodone-acetaminophen (NORCO/VICODIN) 5-325 MG tablet Take 1 tablet by mouth every 6 (six) hours as needed for severe pain. 10/08/21   Lavonia Drafts, MD  Iron-Vitamin C (IRON 100/C) 100-250 MG TABS Take by mouth.    [provider]  Iron-Vitamin C (VITRON-C PO) Take 1 tablet by mouth daily.    [provider]  magnesium gluconate (MAGONATE) 500 MG tablet Take 250 mg by mouth 2 (two) times daily.    [provider]  Melatonin 1 MG TABS Take 1 tablet by mouth at bedtime.    [provider]  Melatonin 1 MG TABS Take by mouth.    [provider]  metoprolol succinate (TOPROL-XL) 100 MG 24 hr tablet Take 100 mg by mouth daily.    [provider]  metoprolol succinate (TOPROL-XL) 100 MG 24 hr tablet 50 mg.  07/09/17   [provider]  oxybutynin (DITROPAN) 5 MG tablet Take 5 mg by mouth daily.    [provider]  oxybutynin (DITROPAN-XL) 5 MG 24 hr tablet Take by mouth. 04/23/17   [provider]  pantoprazole (PROTONIX) 40 MG tablet Take 40 mg by mouth daily.    [provider]  pantoprazole (PROTONIX) 40 MG tablet TAKE ONE TABLET BY MOUTH ONCE DAILY 07/23/17   [provider]  rosuvastatin (CRESTOR) 5 MG tablet Take 5 mg by mouth daily.    [provider]  rosuvastatin (CRESTOR) 5 MG tablet Take by mouth. 04/30/17   [provider]  senna (SENOKOT) 8.6 MG tablet Take by mouth.    [provider]  vitamin B-12 (CYANOCOBALAMIN) 100 MCG tablet Take 100 mcg by mouth daily.     [provider]  warfarin (COUMADIN) 1 MG tablet Take 1 mg by mouth See admin instructions. Takes 1 tablet everyday except Tuesdays and thursdays    [provider]  warfarin (COUMADIN) 1 MG tablet Take two tablets two days a week and one tablet five days a week 06/18/17   [provider]  warfarin (COUMADIN) 2 MG tablet Take 1 tablet (2 mg total) by mouth daily at 6 PM. Patient taking differently: Take 2 mg by mouth See admin instructions. Tuesdays and thursdays 05/29/15   Gladstone Lighter, MD    Physical Exam: Vitals:   10/10/21 1030 10/10/21 1100 10/10/21 1130 10/10/21 1200  BP: 138/66 (!) 145/60 (!) 142/61 (!) 132/59  Pulse: (!) 59 62 62 (!) 59  Resp: 20 (!) 22 19 (!) 23  Temp:      TempSrc:      SpO2: 98% 98% 95% 90%  Weight:      Height:       General: Not in acute distress HEENT:       Eyes: PERRL, EOMI, no scleral icterus.       ENT: No discharge from the ears and nose, no pharynx injection, no tonsillar enlargement.        Neck: No JVD, no bruit, no mass felt. Heme: No neck lymph node enlargement. Cardiac: S1/S2, RRR, No murmurs, No gallops or rubs.  Respiratory: Has fine crackles bilaterally. GI: Soft, nondistended, nontender, no rebound pain, no organomegaly, BS present. GU: No hematuria Ext: No pitting leg edema bilaterally. 1+DP/PT pulse bilaterally. Musculoskeletal: Has tenderness in left side of chest wall, has a large hematoma in the left flank area Skin: No rashes.  Neuro: Alert, oriented X3, cranial nerves II-XII grossly intact, moves all extremities normally. Psych: Patient is not psychotic, no suicidal or hemocidal ideation.  Labs on Admission: I have personally reviewed following labs and imaging studies  CBC: Recent Labs  Lab 10/08/21 1229 10/10/21 0913  WBC 7.8 15.0*  HGB 11.3* 11.8*  HCT 33.2* 35.4*  MCV 101.8* 104.1*  PLT 146* 130*   Basic Metabolic Panel: Recent Labs  Lab 10/08/21 1229 10/10/21 0913  NA 136 135   K 4.3 4.4  CL 104 98  CO2 25 26  GLUCOSE 122* 155*  BUN 32* 32*  CREATININE 2.18* 1.82*  CALCIUM 9.0 9.0   GFR: Estimated Creatinine Clearance: 17.2 mL/min (A) (by C-G formula based on SCr of 1.82 mg/dL (H)). Liver Function Tests: Recent Labs  Lab 10/08/21 1229 10/10/21 0913  AST 18 20  ALT 16 13  ALKPHOS 81 81  BILITOT  1.0 1.0  PROT 6.5 6.8  ALBUMIN 3.5 3.5   No results for input(s): LIPASE, AMYLASE in the last 168 hours. No results for input(s): AMMONIA in the last 168 hours. Coagulation Profile: Recent Labs  Lab 10/08/21 1229  INR 1.3*   Cardiac Enzymes: No results for input(s): CKTOTAL, CKMB, CKMBINDEX, TROPONINI in the last 168 hours. BNP (last 3 results) No results for input(s): PROBNP in the last 8760 hours. HbA1C: No results for input(s): HGBA1C in the last 72 hours. CBG: No results for input(s): GLUCAP in the last 168 hours. Lipid Profile: No results for input(s): CHOL, HDL, LDLCALC, TRIG, CHOLHDL, LDLDIRECT in the last 72 hours. Thyroid Function Tests: No results for input(s): TSH, T4TOTAL, FREET4, T3FREE, THYROIDAB in the last 72 hours. Anemia Panel: No results for input(s): VITAMINB12, FOLATE, FERRITIN, TIBC, IRON, RETICCTPCT in the last 72 hours. Urine analysis:    Component Value Date/Time   COLORURINE YELLOW (A) 05/27/2015 1721   APPEARANCEUR CLEAR (A) 05/27/2015 1721   LABSPEC 1.013 05/27/2015 1721   PHURINE 5.0 05/27/2015 1721   GLUCOSEU NEGATIVE 05/27/2015 1721   HGBUR 2+ (A) 05/27/2015 1721   BILIRUBINUR NEGATIVE 05/27/2015 1721   KETONESUR 1+ (A) 05/27/2015 1721   PROTEINUR NEGATIVE 05/27/2015 1721   NITRITE NEGATIVE 05/27/2015 1721   LEUKOCYTESUR TRACE (A) 05/27/2015 1721   Sepsis Labs: @LABRCNTIP (procalcitonin:4,lacticidven:4) ) Recent Results (from the past 240 hour(s))  Resp Panel by RT-PCR (Flu A&B, Covid) Nasopharyngeal Swab     Status: None   Collection Time: 10/10/21  9:29 AM   Specimen: Nasopharyngeal Swab;  Nasopharyngeal(NP) swabs in vial transport medium  Result Value Ref Range Status   SARS Coronavirus 2 by RT PCR NEGATIVE NEGATIVE Final    Comment: (NOTE) SARS-CoV-2 target nucleic acids are NOT DETECTED.  The SARS-CoV-2 RNA is generally detectable in upper respiratory specimens during the acute phase of infection. The lowest concentration of SARS-CoV-2 viral copies this assay can detect is 138 copies/mL. A negative result does not preclude SARS-Cov-2 infection and should not be used as the sole basis for treatment or other patient management decisions. A negative result may occur with  improper specimen collection/handling, submission of specimen other than nasopharyngeal swab, presence of viral mutation(s) within the areas targeted by this assay, and inadequate number of viral copies(<138 copies/mL). A negative result must be combined with clinical observations, patient history, and epidemiological information. The expected result is Negative.  Fact Sheet for Patients:  EntrepreneurPulse.com.au  Fact Sheet for Healthcare Providers:  IncredibleEmployment.be  This test is no t yet approved or cleared by the [place] FDA and  has been authorized for detection and/or diagnosis of SARS-CoV-2 by FDA under an Emergency Use Authorization (EUA). This EUA will remain  in effect (meaning this test can be used) for the duration of the COVID-19 declaration under Section 564(b)(1) of the Act, 21 U.S.C.section 360bbb-3(b)(1), unless the authorization is terminated  or revoked sooner.       Influenza A by PCR NEGATIVE NEGATIVE Final   Influenza B by PCR NEGATIVE NEGATIVE Final    Comment: (NOTE) The Xpert Xpress SARS-CoV-2/FLU/RSV plus assay is intended as an aid in the diagnosis of influenza from Nasopharyngeal swab specimens and should not be used as a sole basis for treatment. Nasal washings and aspirates are unacceptable for Xpert Xpress  SARS-CoV-2/FLU/RSV testing.  Fact Sheet for Patients: EntrepreneurPulse.com.au  Fact Sheet for Healthcare Providers: IncredibleEmployment.be  This test is not yet approved or cleared by the [place] FDA and has  been authorized for detection and/or diagnosis of SARS-CoV-2 by FDA under an Emergency Use Authorization (EUA). This EUA will remain in effect (meaning this test can be used) for the duration of the COVID-19 declaration under Section 564(b)(1) of the Act, 21 U.S.C. section 360bbb-3(b)(1), unless the authorization is terminated or revoked.  Performed at [facility], [address] [place], [place] [address]      Radiological Exams on Admission: CT ABDOMEN PELVIS WO CONTRAST  Result Date: 10/10/2021 CLINICAL DATA:  Abdominal pain EXAM: CT ABDOMEN AND PELVIS WITHOUT CONTRAST TECHNIQUE: Multidetector CT imaging of the abdomen and pelvis was performed following the standard protocol without IV contrast. COMPARISON:  10/08/2021 FINDINGS: Lower chest: There is a left pleural effusion which appears unchanged from previous exam. Progressive airspace consolidation within the left lower lobe is identified, image 5/4. New areas of ground-glass attenuation, thickening of the peribronchovascular interstitium and atelectasis identified within the right middle lobe and right lower lobe. There is cardiac enlargement with aortic atherosclerosis. Signs of previous CABG procedure. Hepatobiliary: No focal liver abnormality is seen. No gallstones, gallbladder wall thickening, or biliary dilatation. Pancreas: Unremarkable. No pancreatic ductal dilatation or surrounding inflammatory changes. Spleen: Normal in size without focal abnormality. Adrenals/Urinary Tract: Normal adrenal glands. Bilateral kidney cysts. The largest cyst arises off the lateral cortex of the interpolar left kidney measuring 5.3 cm, image 38/2 bilateral renal calcifications are  suspected to be vascular in etiology. Stone within the inferior pole collecting system of the right kidney measures 4 mm, image 52/5. No hydronephrosis identified bilaterally. Urinary bladder is unremarkable. Stomach/Bowel: There is a large hiatal hernia with approximately 50% intrathoracic stomach. Diffuse distension of the intrathoracic and intra-stomach is identified. The appendix is not confidently identified. Moderate stool burden noted within the colon. No bowel wall thickening, inflammation or distension. Sigmoid diverticulosis without signs of acute diverticulitis. Vascular/Lymphatic: Aortic atherosclerosis. Large infrarenal abdominal aortic aneurysm is again noted. Status post stent graft repair. The aneurysm sac measures 8.5 by 6.4 cm, image 41/2. Unchanged from recent exam. No signs of abdominopelvic adenopathy. Reproductive: Uterus and bilateral adnexa are unremarkable. Other: Left inguinal hernia contains a nonobstructed loop of small bowel, image 81/2. no free fluid or fluid collection. Musculoskeletal: Remote healed right superior and inferior pubic rami fractures. Thoracolumbar scoliosis deformity is identified. Multiple subacute fractures are again seen involving the eighth, ninth, tenth, eleventh, and twelfth ribs. Stable appearance of mild, chronic endplate deformity involving the L2 vertebral body. IMPRESSION: 1. No acute findings identified within the abdomen or pelvis. 2. There is progressive airspace disease within the left lower lobe with new postinflammatory changes noted in the right middle lobe and right lower lobe. Imaging findings are concerning for multifocal pneumonia. 3. Large hiatal hernia with approximately 50% intrathoracic stomach. Diffuse distension of the intrathoracic and intra-stomach is identified. 4. Similar appearance of large infrarenal abdominal aortic aneurysm status post stent graft repair. The aneurysm sac measures 8.5 x 6.4 cm. As mention previously endoleak cannot be  excluded on unenhanced study. Correlation with previous imaging at an outside location or contrast enhanced exam is advised. 5. Left inguinal hernia contains a nonobstructed loop of small bowel. 6. Multiple subacute left rib fractures are again seen. 7. Nonobstructing right renal calculus. 8. Aortic Atherosclerosis (ICD10-I70.0). Electronically Signed   By: Kerby Moors M.D.   On: 10/10/2021 10:13   CT T-SPINE NO CHARGE  Result Date: 10/08/2021 CLINICAL DATA:  Trauma.  Fall with multiple rib fractures. EXAM: CT THORACIC AND LUMBAR SPINE WITHOUT CONTRAST  TECHNIQUE: Multiplanar CT images of the thoracic and lumbar spine were reconstructed from contemporary CT of the Chest, Abdomen, and Pelvis CONTRAST:  None COMPARISON:  CT chest, abdomen, and pelvis 07/25/2008 FINDINGS: CT THORACIC SPINE FINDINGS Alignment: Mild S-shaped thoracic scoliosis. No significant listhesis. Vertebrae: Incomplete imaging of T1. No acute thoracic spine fracture or suspicious osseous lesion. Paraspinal and other soft tissues: No acute abnormality identified in the paraspinal soft tissues. Intrathoracic contents reported separately. Disc levels: Asymmetrically advanced disc degeneration on the right at T11-12. Mild-to-moderate multilevel thoracic facet arthrosis. No evidence of high-grade spinal canal stenosis. CT LUMBAR SPINE FINDINGS Segmentation: 5 lumbar type vertebrae. Alignment: Severe lumbar dextroscoliosis. No significant listhesis in the sagittal plane. Vertebrae: No acute fracture or suspicious osseous lesion. Chronic appearing Schmorl's nodes involving the L2 and L5 superior endplates. Paraspinal and other soft tissues: No acute abnormality identified in the paraspinal soft tissues. Intra-abdominal and pelvic contents reported separately. Disc levels: Widespread advanced lumbar disc degeneration and moderately advanced facet arthrosis. Mild-to-moderate spinal stenosis at L4-5 greater than L3-4 due to disc bulging and posterior  element hypertrophy. Moderate left neural foraminal stenosis at L3-4 and L4-5. IMPRESSION: 1. No acute fracture identified in the thoracic or lumbar spine. 2. Severe lumbar dextroscoliosis with advanced disc and facet degeneration. Electronically Signed   By: Logan Bores M.D.   On: 10/08/2021 14:55   CT L-SPINE NO CHARGE  Result Date: 10/08/2021 CLINICAL DATA:  Trauma.  Fall with multiple rib fractures. EXAM: CT THORACIC AND LUMBAR SPINE WITHOUT CONTRAST TECHNIQUE: Multiplanar CT images of the thoracic and lumbar spine were reconstructed from contemporary CT of the Chest, Abdomen, and Pelvis CONTRAST:  None COMPARISON:  CT chest, abdomen, and pelvis 07/25/2008 FINDINGS: CT THORACIC SPINE FINDINGS Alignment: Mild S-shaped thoracic scoliosis. No significant listhesis. Vertebrae: Incomplete imaging of T1. No acute thoracic spine fracture or suspicious osseous lesion. Paraspinal and other soft tissues: No acute abnormality identified in the paraspinal soft tissues. Intrathoracic contents reported separately. Disc levels: Asymmetrically advanced disc degeneration on the right at T11-12. Mild-to-moderate multilevel thoracic facet arthrosis. No evidence of high-grade spinal canal stenosis. CT LUMBAR SPINE FINDINGS Segmentation: 5 lumbar type vertebrae. Alignment: Severe lumbar dextroscoliosis. No significant listhesis in the sagittal plane. Vertebrae: No acute fracture or suspicious osseous lesion. Chronic appearing Schmorl's nodes involving the L2 and L5 superior endplates. Paraspinal and other soft tissues: No acute abnormality identified in the paraspinal soft tissues. Intra-abdominal and pelvic contents reported separately. Disc levels: Widespread advanced lumbar disc degeneration and moderately advanced facet arthrosis. Mild-to-moderate spinal stenosis at L4-5 greater than L3-4 due to disc bulging and posterior element hypertrophy. Moderate left neural foraminal stenosis at L3-4 and L4-5. IMPRESSION: 1. No acute  fracture identified in the thoracic or lumbar spine. 2. Severe lumbar dextroscoliosis with advanced disc and facet degeneration. Electronically Signed   By: Logan Bores M.D.   On: 10/08/2021 14:55   DG Chest Portable 1 View  Result Date: 10/10/2021 CLINICAL DATA:  87 year old female with chest pain and shortness of breath. Multiple recent left rib fractures. EXAM: PORTABLE CHEST 1 VIEW COMPARISON:  CT Chest, Abdomen, and Pelvis 10/08/2021 and earlier. FINDINGS: Portable AP semi upright view at 1121 hours. No pneumothorax identified. Left pleural effusion has mildly increased, now small to moderate. No retrocardiac air bronchograms. No pulmonary edema. Patchy opacity now also at the right lung base. Stable cardiac size and mediastinal contours. Prior CABG. Left chest cardiac pacemaker. Visualized tracheal air column is within normal limits. Rib fractures better demonstrated by  CT. Calcified aortic atherosclerosis. IMPRESSION: 1. No pneumothorax, but the left pleural effusion following rib fractures has increased since 10/08/2021, now small to moderate. 2. New patchy right lung base opacity, favor atelectasis. Electronically Signed   By: Genevie Ann M.D.   On: 10/10/2021 11:31   CT CHEST ABDOMEN PELVIS WO CONTRAST  Result Date: 10/08/2021 CLINICAL DATA:  Fall with rib fractures. EXAM: CT CHEST, ABDOMEN AND PELVIS WITHOUT CONTRAST TECHNIQUE: Multidetector CT imaging of the chest, abdomen and pelvis was performed following the standard protocol without IV contrast. COMPARISON:  Radiography earlier same day. FINDINGS: CT CHEST FINDINGS Cardiovascular: Previous median sternotomy. Cardiomegaly. Pacemaker in place. Aortic atherosclerosis. Coronary artery calcification. Mediastinum/Nodes: No mediastinal or hilar mass or lymphadenopathy. Lungs/Pleura: Left pleural effusion layering dependently with atelectasis at the left lung base. No pneumothorax. Chronic bronchiectasis and volume loss in the right middle lobe.  Musculoskeletal: Fractures of the posterior tenth and eleventh ribs. Fracture of the posterolateral seventh, eighth and ninth ribs. No evidence of thoracic spinal fracture. CT ABDOMEN PELVIS FINDINGS Hepatobiliary: No acute or traumatic liver finding. No calcified gallstones. Pancreas: Normal Spleen: Normal Adrenals/Urinary Tract: Adrenal glands are normal. Bilateral renal cysts, the largest on the left measuring 5.2 cm in diameter. No evidence of renal injury or hydronephrosis. Few punctate renal calcifications that could be vascular or tiny nonobstructing stones. Bladder is normal. Stomach/Bowel: Stomach and small intestine are unremarkable. No acute colon finding. Vascular/Lymphatic: Aortoiliac stent graft. Aneurysm sac measuring 6.8 x 8.5 cm. No previous imaging of this aneurysm. Consider correlation with previous studies or contrast enhanced examination to assess for possible endoleak. No sign of retroperitoneal bleeding presently. No adenopathy. Reproductive: No pelvic mass. Other: No free fluid or air. Musculoskeletal: Scoliosis and degenerative change throughout the lumbar region. Old appearing minor superior endplate depression at L2. No sign of acute lumbar region fracture. No sign of pelvic or hip fracture. IMPRESSION: Fracture of the left posterolateral seventh, eighth and ninth ribs. Fracture of the posterior tenth and eleventh ribs on the left. No pneumothorax. Left pleural effusion layering dependently with dependent atelectasis. No acute traumatic abdominal or pelvic finding. Aortoiliac stent graft. Aortic aneurysmal sac measuring 6.8 x 8.5 cm. Endoleak not excluded on this noncontrast study. Consider correlation with previous imaging at another location or contrast enhanced examination. No evidence of acute retroperitoneal bleeding. Curvature and chronic degenerative change of the thoracic and lumbar spine. Old appearing minor superior endplate deformity at L2. No acute bone finding seen.  Electronically Signed   By: Nelson Chimes M.D.   On: 10/08/2021 15:08     EKG: Not done in ED, will get one.   Assessment/Plan Principal Problem:   Multifocal pneumonia Active Problems:   Hypertension   GERD (gastroesophageal reflux disease)   Coronary artery disease   Atrial fibrillation, chronic (HCC)   Abdominal pain   Hiatal hernia   AAA (abdominal aortic aneurysm)   Left rib fracture   Sepsis ([place])   CKD (chronic kidney disease), stage IV (HCC)   Pleural effusion on left   Sepsis due to multifocal pneumonia: pt has multifocal pneumonia as shown by CT scan of abdomen/pelvis.  Patient meets criteria for sepsis with WBC 15.0, tachypnea with RR 23.  Pending lactic acid.  Currently hemodynamically stable.  Patient has new oxygen requirement currently on 2 L.  - admitted to progressive unit as inpatient - IV Rocephin and azithromycin - Mucinex for cough  - Bronchodilators - Urine legionella and S. pneumococcal antigen - Follow up blood culture  x2, sputum culture - will get Procalcitonin and trend lactic acid level per sepsis protocol - IVF: 1L of NS bolus in ED, followed by 75 mL per hour of NS   Left rib fracture: -Control: As needed Percocet, Tylenol, morphine -Incentive spirometry  Hypertension -IV hydralazine as needed -Metoprolol, amlodipine  GERD (gastroesophageal reflux disease) -Protonix  Coronary artery disease: S/p of CABG.  No chest pain --Crestor -Hold aspirin due to large hematoma  Abdominal pain, nausea, vomiting: Etiology is not clear.  May be related to hiatal hernia and multifocal pneumonia. Currently patient does not have active vomiting. -Check lipase -Supportive care, as needed morphine for pain Zofran for nausea -IV fluid  Hiatal hernia -Protonix  AAA (abdominal aortic aneurysm): Patient has told her daughter that she will never consider surgical repair of AAA. -f/u as outpt  CKD (chronic kidney disease), stage IV ([place]): Stable.  Recent  baseline creatinine 2.18, her creatinine is 1.82, BUN 32 today. -Follow-up with BMP  Atrial fibrillation chronic: -Hold Eliquis due to large hematoma -Continue metoprolol, amiodarone  Pleural effusion on left -will order thoracentesis         DVT ppx: SCD Code Status: Full code per pt and her daughter Family Communication: Yes, patient's  daughter at bed side Disposition Plan:  Anticipate discharge back to previous environment Consults called:  none Admission status and Level of care: Progressive Cardiac:    as inpt       Status is: Inpatient  Remains inpatient appropriate because: Patient has multiple comorbidities, now presents with sepsis due to multifocal pneumonia, patient also has left sided pleural effusion and multiple left rib fracture.  Her presentation is highly complicated.  Patient is at high risk of deteriorating given her old age.  Will need to be treated in hospital for at least 2 days.            Date of Service 10/10/2021    I[facility]   If 7PM-7AM, please contact night-coverage [email or web address] 10/10/2021, 1:38 PM

## 2021-10-10 NOTE — ED Notes (Signed)
Family remains with patient. Patient is resting.

## 2021-10-10 NOTE — ED Notes (Signed)
Pt taken to CT.

## 2021-10-10 NOTE — ED Notes (Signed)
Family states patient was using at home the incentive spirometer that was given upon discharge on Friday.

## 2021-10-10 NOTE — ED Notes (Signed)
Report given to Tia RN.

## 2021-10-10 NOTE — ED Notes (Signed)
Patient was encouraged to use Purewick. Patient was restless while trying to void and Purewick was dislodged. Patient was cleaned up and Purwick and brief were replaced.

## 2021-10-10 NOTE — ED Provider Notes (Signed)
[facility] Emergency Department Provider Note  Time seen: 9:48 AM  I have reviewed the triage vital signs and the nursing notes.   HISTORY  Chief Complaint Abdominal Pain  HPI Claire Kelley is a 87 y.o. female with a past medical history of atrial fibrillation, CAD, gastric reflux, hypertension, hyperlipidemia, presents to the emergency department for worsening pain in the left chest and abdomen.  According to the patient approximately 6 days ago she fell, 2 days ago she presented to the hospital for the pain that had progressively worsened.  Patient was found to have 5 rib fractures, was ultimately pain controlled and discharged with outpatient follow-up.  Patient states since going home she has had a very difficult time at home unable to get out of bed or out of a chair without significant assistance.  States she is now nauseated with frequent vomiting this morning unable to keep down her pain medications due to nausea.  Daughter who is here with the patient states she is having significant difficulty at home.   Past Medical History:  Diagnosis Date   A-fib [facility])    AICD (automatic cardioverter/defibrillator) present    Coronary artery disease    Diverticulosis    GERD (gastroesophageal reflux disease)    GERD (gastroesophageal reflux disease)    High cholesterol    Hypertension    Renal disorder    CKD    Patient Active Problem List   Diagnosis Date Noted   Acute cystitis 05/29/2015   Altered mental status 05/27/2015    Past Surgical History:  Procedure Laterality Date   ABDOMINAL AORTIC ANEURYSM REPAIR     CORONARY ARTERY BYPASS GRAFT     PACEMAKER PLACEMENT      Prior to Admission medications   Medication Sig Start Date End Date Taking? Authorizing Provider  acetaminophen (TYLENOL) 650 MG CR tablet Take 1,300 mg by mouth every 8 (eight) hours as needed for pain.    [provider]  acetaminophen (TYLENOL) 650 MG CR tablet Take by  mouth. 11/16/10   [provider]  aspirin EC 81 MG tablet Take 81 mg by mouth daily.    [provider]  bisacodyl (DULCOLAX) 5 MG EC tablet Take 1 tablet (5 mg total) by mouth daily as needed for moderate constipation. 10/08/21 10/08/22  Lavonia Drafts, MD  Cholecalciferol (VITAMIN D3) 1000 UNITS CAPS Take 1 capsule by mouth daily.    [provider]  Cholecalciferol 1000 units capsule Take by mouth. 05/19/09   [provider]  clotrimazole-betamethasone (LOTRISONE) cream Apply 1 application topically 2 (two) times daily as needed (for irritation).    [provider]  CRANBERRY-VITAMIN C PO Take 1 capsule by mouth 2 (two) times daily.    [provider]  Cranberry-Vitamin C-Vitamin E (CRANBERRY CONCENTRATE) 140-100-3 MG-MG-UNIT CAPS Take by mouth.    [provider]  donepezil (ARICEPT) 5 MG tablet Take by mouth. 08/16/18   [provider]  HYDROcodone-acetaminophen (NORCO/VICODIN) 5-325 MG tablet Take 1 tablet by mouth every 6 (six) hours as needed for severe pain. 10/08/21   Lavonia Drafts, MD  Iron-Vitamin C (IRON 100/C) 100-250 MG TABS Take by mouth.    [provider]  Iron-Vitamin C (VITRON-C PO) Take 1 tablet by mouth daily.    [provider]  magnesium gluconate (MAGONATE) 500 MG tablet Take 250 mg by mouth 2 (two) times daily.    [provider]  Melatonin 1 MG TABS Take 1 tablet by mouth  at bedtime.    [provider]  Melatonin 1 MG TABS Take by mouth.    [provider]  metoprolol succinate (TOPROL-XL) 100 MG 24 hr tablet Take 100 mg by mouth daily.    [provider]  metoprolol succinate (TOPROL-XL) 100 MG 24 hr tablet 50 mg.  07/09/17   [provider]  oxybutynin (DITROPAN) 5 MG tablet Take 5 mg by mouth daily.    [provider]  oxybutynin (DITROPAN-XL) 5 MG 24 hr tablet Take by mouth. 04/23/17   [provider]  pantoprazole  (PROTONIX) 40 MG tablet Take 40 mg by mouth daily.    [provider]  pantoprazole (PROTONIX) 40 MG tablet TAKE ONE TABLET BY MOUTH ONCE DAILY 07/23/17   [provider]  rosuvastatin (CRESTOR) 5 MG tablet Take 5 mg by mouth daily.    [provider]  rosuvastatin (CRESTOR) 5 MG tablet Take by mouth. 04/30/17   [provider]  senna (SENOKOT) 8.6 MG tablet Take by mouth.    [provider]  vitamin B-12 (CYANOCOBALAMIN) 100 MCG tablet Take 100 mcg by mouth daily.    [provider]  warfarin (COUMADIN) 1 MG tablet Take 1 mg by mouth See admin instructions. Takes 1 tablet everyday except Tuesdays and thursdays    [provider]  warfarin (COUMADIN) 1 MG tablet Take two tablets two days a week and one tablet five days a week 06/18/17   [provider]  warfarin (COUMADIN) 2 MG tablet Take 1 tablet (2 mg total) by mouth daily at 6 PM. Patient taking differently: Take 2 mg by mouth See admin instructions. Tuesdays and thursdays 05/29/15   Gladstone Lighter, MD    Allergies  Allergen Reactions   Bactrim [Sulfamethoxazole-Trimethoprim] Other (See Comments)    Reaction:  Unknown    Ciprocinonide [Fluocinolone] Other (See Comments)    Reaction:  Unknown    Ciprofloxacin Other (See Comments)   Codeine    Lipitor [Atorvastatin] Other (See Comments)    Reaction:  Leg cramps    Macrobid [Nitrofurantoin Monohyd Macro] Other (See Comments)    Reaction:  Unknown    Paxil [Paroxetine Hcl] Other (See Comments)    Reaction:  Made pt "loopy"    No family history on file.  Social History Social History   Tobacco Use   Smoking status: Former   Smokeless tobacco: Never  Substance Use Topics   Alcohol use: No    Review of Systems Constitutional: Negative for fever. Cardiovascular: Positive for left chest pain worse with any movement or breathing Respiratory: Negative for shortness of breath.  Pain with  breathing. Gastrointestinal: Positive for diffuse abdominal pain more so on the left side.  Positive for nausea and vomiting. Genitourinary: Negative for urinary compaints Musculoskeletal: Left chest wall pain Neurological: Negative for headache All other ROS negative  ____________________________________________   PHYSICAL EXAM:  VITAL SIGNS: ED Triage Vitals  Enc Vitals Group     BP 10/10/21 0912 139/60     Pulse Rate 10/10/21 0912 61     Resp 10/10/21 0912 20     Temp 10/10/21 0912 98.4 F (36.9 C)     Temp Source 10/10/21 0912 Oral     SpO2 10/10/21 0907 93 %     Weight 10/10/21 0908 124 lb 3.2 oz (56.3 kg)     Height 10/10/21 0908 5\' 7"  (1.702 m)     Head Circumference --      Peak Flow --  Pain Score 10/10/21 0908 10     Pain Loc --      Pain Edu? --      Excl. in [place]? --    Constitutional: Alert and oriented. Well appearing and in no distress.  Patient is a good historian. Eyes: Normal exam ENT      Head: Normocephalic and atraumatic.      Mouth/Throat: Mucous membranes are moist. Cardiovascular: Normal rate, regular rhythm.  Respiratory: Normal respiratory effort without tachypnea nor retractions. Breath sounds are clear moderate left chest tenderness to palpation especially lateral chest. Gastrointestinal: Mild diffuse tenderness without focal area of tenderness identified. Musculoskeletal: Nontender with normal range of motion in all extremities. Neurologic:  Normal speech and language. No gross focal neurologic deficits  Skin:  Skin is warm, dry and intact.  Psychiatric: Mood and affect are normal.   ____________________________________________      RADIOLOGY  CT scan shows what appears to be airspace opacities within the lung consistent with multifocal pneumonia.  ____________________________________________   INITIAL IMPRESSION / ASSESSMENT AND PLAN / ED COURSE  Pertinent labs & imaging results that were available during my care of the patient  were reviewed by me and considered in my medical decision making (see chart for details).   Patient presents emergency department for worsening left chest wall pain abdominal pain unable to care for self at home now with nausea and vomiting unable to take pain medication.  I reviewed the patient's work-up from 2 days ago patient had 5 rib fractures she also has a large abdominal aortic aneurysm but unable to tolerate IV dye due to kidney function.  We will recheck labs, obtain a repeat abdominal CT scan as well as a chest x-ray.  We will treat pain and nausea, IV hydrate and continue to closely monitor.  Given the patient's significant pain with multiple rib fractures now with nausea vomiting unable to tolerate pain medication at home and significant difficulty caring for self at home anticipate likely admission to the hospitalist service for pain control and possible rehab placement.  CTA appears to show multifocal pneumonia.  No acute findings in the abdomen continues to have a large aneurysmal sac of the aorta.  Given the patient's leukocytosis, findings consistent with likely pneumonia in addition to chest pain now nausea and vomiting we will admit to the hospital service for further work-up and treatment.  Patient and daughter agreeable to plan of care.  Shaylon Gillean was evaluated in Emergency Department on 10/10/2021 for the symptoms described in the history of present illness. She was evaluated in the context of the global COVID-19 pandemic, which necessitated consideration that the patient might be at risk for infection with the SARS-CoV-2 virus that causes COVID-19. Institutional protocols and algorithms that pertain to the evaluation of patients at risk for COVID-19 are in a state of rapid change based on information released by regulatory bodies including the CDC and federal and state organizations. These policies and algorithms were followed during the patient's care in the  ED.  ____________________________________________   FINAL CLINICAL IMPRESSION(S) / ED DIAGNOSES  Multiple rib fractures Chest pain Abdominal pain Pneumonia   Harvest Dark, MD 10/10/21 1112

## 2021-10-10 NOTE — ED Notes (Signed)
Medications reviewed with Daughter [name].

## 2021-10-11 DIAGNOSIS — J189 Pneumonia, unspecified organism: Secondary | ICD-10-CM | POA: Diagnosis not present

## 2021-10-11 LAB — BASIC METABOLIC PANEL
Anion gap: 9 (ref 5–15)
BUN: 32 mg/dL — ABNORMAL HIGH (ref 8–23)
CO2: 22 mmol/L (ref 22–32)
Calcium: 8.6 mg/dL — ABNORMAL LOW (ref 8.9–10.3)
Chloride: 106 mmol/L (ref 98–111)
Creatinine, Ser: 1.69 mg/dL — ABNORMAL HIGH (ref 0.44–1.00)
GFR, Estimated: 28 mL/min — ABNORMAL LOW (ref >60)
Glucose, Bld: 81 mg/dL (ref 70–99)
Potassium: 4.8 mmol/L (ref 3.5–5.1)
Sodium: 137 mmol/L (ref 135–145)

## 2021-10-11 LAB — URINALYSIS, COMPLETE (UACMP) WITH MICROSCOPIC
Bilirubin Urine: NEGATIVE
Glucose, UA: NEGATIVE mg/dL
Hgb urine dipstick: NEGATIVE
Ketones, ur: NEGATIVE mg/dL
Nitrite: POSITIVE — AB
Protein, ur: NEGATIVE mg/dL
Specific Gravity, Urine: 1.024 (ref 1.005–1.030)
WBC, UA: >50 WBC/hpf — ABNORMAL HIGH (ref 0–5)
pH: 5 (ref 5.0–8.0)

## 2021-10-11 LAB — CBC
HCT: 30.6 % — ABNORMAL LOW (ref 36.0–46.0)
Hemoglobin: 10 g/dL — ABNORMAL LOW (ref 12.0–15.0)
MCH: 33.8 pg (ref 26.0–34.0)
MCHC: 32.7 g/dL (ref 30.0–36.0)
MCV: 103.4 fL — ABNORMAL HIGH (ref 80.0–100.0)
Platelets: 138 10*3/uL — ABNORMAL LOW (ref 150–400)
RBC: 2.96 MIL/uL — ABNORMAL LOW (ref 3.87–5.11)
RDW: 13.8 % (ref 11.5–15.5)
WBC: 10.9 10*3/uL — ABNORMAL HIGH (ref 4.0–10.5)
nRBC: 0 % (ref 0.0–0.2)

## 2021-10-11 LAB — STREP PNEUMONIAE URINARY ANTIGEN: Strep Pneumo Urinary Antigen: NEGATIVE

## 2021-10-11 NOTE — Progress Notes (Signed)
PT Cancellation Note  Patient Details Name: Linell Meldrum MRN: [id number] DOB: Aug 20, 1926   Cancelled Treatment:    Reason Eval/Treat Not Completed: Other (comment).  PT consult received.  Chart reviewed.  Pt resting in bed upon PT arrival with 2 visitors present.  Per discussion with pt, PT will plan to return tomorrow to attempt to coordinate session/eval better with pain meds.  Leitha Bleak, PT 10/11/21, 5:56 PM

## 2021-10-11 NOTE — ED Notes (Signed)
RN aware bed assigned ?

## 2021-10-11 NOTE — Progress Notes (Signed)
[facility] NAME: Claire Kelley    MR#:  [id number]  DATE OF BIRTH:  [birth year]  SUBJECTIVE:  patient came in after she had a fall at home on 17 October. Proceeding emergency room found to have left five rib fractures. She was sent home on PO pain meds. came in with increasing shortness of breath, chest pain and big bruise on the left rib cage.  Patient alert and oriented no fever. No family at bedside. Complains of ribs hurting.  REVIEW OF SYSTEMS:   Review of Systems  Constitutional:  Negative for chills, fever and weight loss.  HENT:  Negative for ear discharge, ear pain and nosebleeds.   Eyes:  Negative for blurred vision, pain and discharge.  Respiratory:  Positive for cough and shortness of breath. Negative for sputum production, wheezing and stridor.   Cardiovascular:  Positive for chest pain. Negative for palpitations, orthopnea and PND.  Gastrointestinal:  Negative for abdominal pain, diarrhea, nausea and vomiting.  Genitourinary:  Negative for frequency and urgency.  Musculoskeletal:  Negative for back pain and joint pain.  Neurological:  Positive for weakness. Negative for sensory change, speech change and focal weakness.  Psychiatric/Behavioral:  Negative for depression and hallucinations. The patient is not nervous/anxious.   Tolerating Diet:yes Tolerating PT:   DRUG ALLERGIES:   Allergies  Allergen Reactions   Bactrim [Sulfamethoxazole-Trimethoprim] Other (See Comments)    Reaction:  Unknown    Ciprocinonide [Fluocinolone] Other (See Comments)    Reaction:  Unknown    Ciprofloxacin Other (See Comments)   Codeine    Lipitor [Atorvastatin] Other (See Comments)    Reaction:  Leg cramps    Macrobid [Nitrofurantoin Monohyd Macro] Other (See Comments)    Reaction:  Unknown    Paxil [Paroxetine Hcl] Other (See Comments)    Reaction:  Made pt "loopy"    VITALS:  Blood pressure (!) 122/41, pulse 65, temperature  97.7 F (36.5 C), temperature source Oral, resp. rate 20, height 5\' 7"  (1.702 m), weight 56.3 kg, SpO2 100 %.  PHYSICAL EXAMINATION:   Physical Exam  GENERAL:  87 y.o.-year-old patient lying in the bed with no acute distress.   LUNGS: Decreased  breath sounds bilaterally, no wheezing, rales, rhonchi. No use of accessory muscles of respiration.  Large bruise on the left rib cage  CARDIOVASCULAR: S1, S2 normal. No murmurs, rubs, or gallops.  ABDOMEN: Soft, nontender, nondistended. Bowel sounds present. No organomegaly or mass.  EXTREMITIES: No cyanosis, clubbing or edema b/l.    NEUROLOGIC: Cranial nerves II through XII are intact. No focal Motor or sensory deficits b/l.  Generalized weakness  PSYCHIATRIC:  patient is alert and oriented x 3.  SKIN: No obvious rash, lesion, or ulcer.   LABORATORY PANEL:  CBC Recent Labs  Lab 10/11/21 0604  WBC 10.9*  HGB 10.0*  HCT 30.6*  PLT 138*    Chemistries  Recent Labs  Lab 10/10/21 0913 10/11/21 0604  NA 135 137  K 4.4 4.8  CL 98 106  CO2 26 22  GLUCOSE 155* 81  BUN 32* 32*  CREATININE 1.82* 1.69*  CALCIUM 9.0 8.6*  AST 20  --   ALT 13  --   ALKPHOS 81  --   BILITOT 1.0  --    Cardiac Enzymes No results for input(s): TROPONINI in the last 168 hours. RADIOLOGY:  CT ABDOMEN PELVIS WO CONTRAST  Result Date: 10/10/2021 CLINICAL DATA:  Abdominal pain EXAM: CT ABDOMEN  AND PELVIS WITHOUT CONTRAST TECHNIQUE: Multidetector CT imaging of the abdomen and pelvis was performed following the standard protocol without IV contrast. COMPARISON:  10/08/2021 FINDINGS: Lower chest: There is a left pleural effusion which appears unchanged from previous exam. Progressive airspace consolidation within the left lower lobe is identified, image 5/4. New areas of ground-glass attenuation, thickening of the peribronchovascular interstitium and atelectasis identified within the right middle lobe and right lower lobe. There is cardiac enlargement with aortic  atherosclerosis. Signs of previous CABG procedure. Hepatobiliary: No focal liver abnormality is seen. No gallstones, gallbladder wall thickening, or biliary dilatation. Pancreas: Unremarkable. No pancreatic ductal dilatation or surrounding inflammatory changes. Spleen: Normal in size without focal abnormality. Adrenals/Urinary Tract: Normal adrenal glands. Bilateral kidney cysts. The largest cyst arises off the lateral cortex of the interpolar left kidney measuring 5.3 cm, image 38/2 bilateral renal calcifications are suspected to be vascular in etiology. Stone within the inferior pole collecting system of the right kidney measures 4 mm, image 52/5. No hydronephrosis identified bilaterally. Urinary bladder is unremarkable. Stomach/Bowel: There is a large hiatal hernia with approximately 50% intrathoracic stomach. Diffuse distension of the intrathoracic and intra-stomach is identified. The appendix is not confidently identified. Moderate stool burden noted within the colon. No bowel wall thickening, inflammation or distension. Sigmoid diverticulosis without signs of acute diverticulitis. Vascular/Lymphatic: Aortic atherosclerosis. Large infrarenal abdominal aortic aneurysm is again noted. Status post stent graft repair. The aneurysm sac measures 8.5 by 6.4 cm, image 41/2. Unchanged from recent exam. No signs of abdominopelvic adenopathy. Reproductive: Uterus and bilateral adnexa are unremarkable. Other: Left inguinal hernia contains a nonobstructed loop of small bowel, image 81/2. no free fluid or fluid collection. Musculoskeletal: Remote healed right superior and inferior pubic rami fractures. Thoracolumbar scoliosis deformity is identified. Multiple subacute fractures are again seen involving the eighth, ninth, tenth, eleventh, and twelfth ribs. Stable appearance of mild, chronic endplate deformity involving the L2 vertebral body. IMPRESSION: 1. No acute findings identified within the abdomen or pelvis. 2. There is  progressive airspace disease within the left lower lobe with new postinflammatory changes noted in the right middle lobe and right lower lobe. Imaging findings are concerning for multifocal pneumonia. 3. Large hiatal hernia with approximately 50% intrathoracic stomach. Diffuse distension of the intrathoracic and intra-stomach is identified. 4. Similar appearance of large infrarenal abdominal aortic aneurysm status post stent graft repair. The aneurysm sac measures 8.5 x 6.4 cm. As mention previously endoleak cannot be excluded on unenhanced study. Correlation with previous imaging at an outside location or contrast enhanced exam is advised. 5. Left inguinal hernia contains a nonobstructed loop of small bowel. 6. Multiple subacute left rib fractures are again seen. 7. Nonobstructing right renal calculus. 8. Aortic Atherosclerosis (ICD10-I70.0). Electronically Signed   By: Kerby Moors M.D.   On: 10/10/2021 10:13   DG Chest Portable 1 View  Result Date: 10/10/2021 CLINICAL DATA:  87 year old female with chest pain and shortness of breath. Multiple recent left rib fractures. EXAM: PORTABLE CHEST 1 VIEW COMPARISON:  CT Chest, Abdomen, and Pelvis 10/08/2021 and earlier. FINDINGS: Portable AP semi upright view at 1121 hours. No pneumothorax identified. Left pleural effusion has mildly increased, now small to moderate. No retrocardiac air bronchograms. No pulmonary edema. Patchy opacity now also at the right lung base. Stable cardiac size and mediastinal contours. Prior CABG. Left chest cardiac pacemaker. Visualized tracheal air column is within normal limits. Rib fractures better demonstrated by CT. Calcified aortic atherosclerosis. IMPRESSION: 1. No pneumothorax, but the left pleural effusion  following rib fractures has increased since 10/08/2021, now small to moderate. 2. New patchy right lung base opacity, favor atelectasis. Electronically Signed   By: Genevie Ann M.D.   On: 10/10/2021 11:31   ASSESSMENT AND PLAN:    Claire Kelley is a 87 y.o. female with medical history significant of hypertension, GERD, CKD-4, CAD, CABG, AICD, atrial fibrillation on Coumadin, diverticulosis, dementia, AAA, who presents with cough, shortness breath, nausea, vomiting, abdominal pain, left chest wall pain.  Patient fell on 10/17 at home and injured her left side of the chest, developed a big hematoma in the left flank area.  Patient was seen in ED on 10/08/2021, and found to have 5 rib fractures. Pt continues to have left-sided chest wall pain.  Sepsis due to multifocal pneumonia:  --pt has multifocal pneumonia as shown by CT scan of abdomen/pelvis.  Patient meets criteria for sepsis with WBC 15.0, tachypnea with RR 23.  Pending lactic acid.  Currently hemodynamically stable.  Patient has new oxygen requirement currently on 2 L.  - IV Rocephin and azithromycin - Mucinex for cough  - Bronchodilators -  S. pneumococcal antigen-- negative -- Procalcitonin 0.37  and trend lactic acid level down to 1.1 - I patient received IV fluids per sepsis protocol. She is currently afebrile.   Left rib fracture: with left rib cage bruise -Control: As needed Percocet, Tylenol, morphine -Incentive spirometry   Hypertension -IV hydralazine as needed -Metoprolol, amlodipine   GERD (gastroesophageal reflux disease) -Protonix   Coronary artery disease: S/p of CABG.  No chest pain --Crestor -Hold eliquis due to large hematoma -- patient not taking aspirin   Abdominal pain, nausea, vomiting: Etiology is not clear.  May be related to hiatal hernia and multifocal pneumonia. Currently patient does not have active vomiting. -Supportive care, as needed morphine for pain Zofran for nausea -received IV fluid   Hiatal hernia -Protonix   AAA (abdominal aortic aneurysm): Patient has told her daughter that she will never consider surgical repair of AAA. -f/u as outpt   CKD (chronic kidney disease), stage IV ([place]): Stable.  Recent baseline  creatinine 2.18, her creatinine is 1.82, BUN 32 today. -- Creatinine 1.6   Atrial fibrillation chronic: -Hold Eliquis due to large hematoma -Continue metoprolol, amiodarone   Pleural effusion on left -- continue to monitor. Reviewed CT's chest with radiologist. Patient does not have a large pleural effusion. If conservative management does not help with shortness of breath will consider thoracentesis. This was discussed with patient's daughter [name]            DVT ppx: SCD Code Status: Full code per pt and her daughter Family Communication: Yes, patient's  daughter at bed side Disposition Plan:  Anticipate discharge back to previous environment Consults called:  none Admission status and Level of care: Progressive Cardiac:    as inpt        PT OT TOC for discharge planning      TOTAL TIME TAKING CARE OF THIS PATIENT: 25 minutes.  >50% time spent on counselling and coordination of care  Note: This dictation was prepared with Dragon dictation along with smaller phrase technology. Any transcriptional errors that result from this process are unintentional.  Fritzi Mandes M.D    [facility]   CC: Primary care physician; [place], Dola Argyle, MD Patient ID: Claire Kelley, female   DOB: 1926-09-28, 87 y.o.   MRN: [id number]

## 2021-10-11 NOTE — ED Notes (Signed)
Patient is resting comfortably. 

## 2021-10-12 DIAGNOSIS — J189 Pneumonia, unspecified organism: Secondary | ICD-10-CM | POA: Diagnosis not present

## 2021-10-12 LAB — LEGIONELLA PNEUMOPHILA SEROGP 1 UR AG: L. pneumophila Serogp 1 Ur Ag: NEGATIVE

## 2021-10-12 MED ORDER — METOPROLOL SUCCINATE ER 25 MG PO TB24
12.5000 mg | ORAL_TABLET | Freq: Every day | ORAL | Status: DC
  Administered 2021-10-12 – 2021-10-15 (×4): 12.5 mg via ORAL
  Filled 2021-10-12 (×4): qty 1

## 2021-10-12 NOTE — Progress Notes (Signed)
D/C tele per MD order

## 2021-10-12 NOTE — Evaluation (Signed)
Physical Therapy Evaluation Patient Details Name: Claire Kelley MRN: [id number] DOB: May 20, 1926 Today's Date: 10/12/2021  History of Present Illness  Pt is a 87 y.o. female presenting to hospital 10/23 with worsening pain in L chest and abdomen (pt fell about 6 days prior and had presented to ED where she was found to have 5 rib fx's but has had a difficult time managing at home since discharge home); now with nausea and frequent vomiting.  Pt admitted with sepsis d/t multifocal PNA, L rib fx's, L pleural effusion, abdominal pain, and nausea/vomiting.  PMH includes a-fib, CAD, gastric reflux, htn, HLD, AICD, abdominal aortic aneurysm repair, CABG.  Clinical Impression  Prior to hospital admission, pt was modified independent ambulating with quad cane; lives alone on main level of home with 2-3 steps to enter; daughter visits daily and assists with medication management.  Currently pt is SBA semi-supine to sitting edge of bed; CGA with transfers; and CGA to ambulate 40 feet with RW.  Limited distance ambulating d/t SOB (pt's O2 sats 90-91% on 3 L O2 post ambulation but 93% at rest on 3 L beginning/end of session).  Pt would benefit from skilled PT to address noted impairments and functional limitations (see below for any additional details).  Upon hospital discharge, pt would benefit from HHPT and intermittent support at home.       Recommendations for follow up therapy are one component of a multi-disciplinary discharge planning process, led by the attending physician.  Recommendations may be updated based on patient status, additional functional criteria and insurance authorization.  Follow Up Recommendations Home health PT    Assistance Recommended at Discharge Intermittent Supervision/Assistance  Functional Status Assessment Patient has had a recent decline in their functional status and demonstrates the ability to make significant improvements in function in a reasonable and predictable amount  of time.  Equipment Recommendations  Rolling walker (2 wheels);3in1 (PT)    Recommendations for Other Services OT consult     Precautions / Restrictions Precautions Precautions: Fall Precaution Comments: rib fractures Restrictions Weight Bearing Restrictions: No      Mobility  Bed Mobility Overal bed mobility: Needs Assistance Bed Mobility: Supine to Sit     Supine to sit: Supervision;HOB elevated     General bed mobility comments: very mild increased effort to perform on own    Transfers Overall transfer level: Needs assistance Equipment used: Rolling walker (2 wheels) Transfers: Sit to/from [facility] Sit to Stand: Min guard Stand pivot transfers: Min guard (bed to recliner)         General transfer comment: increased effort to stand from bed on own but did well standing from recliner; vc's for UE placement    Ambulation/Gait Ambulation/Gait assistance: Min guard Gait Distance (Feet): 40 Feet Assistive device: Rolling walker (2 wheels)   Gait velocity: decreased   General Gait Details: partial step through gait pattern; inital vc's for walker use; limited d/t SOB  Stairs            Wheelchair Mobility    Modified Rankin (Stroke Patients Only)       Balance Overall balance assessment: Needs assistance Sitting-balance support: No upper extremity supported;Feet supported Sitting balance-Leahy Scale: Good Sitting balance - Comments: steady sitting reaching within BOS   Standing balance support: Bilateral upper extremity supported;During functional activity Standing balance-Leahy Scale: Good Standing balance comment: no loss of balance noted during ambulation/functional activities during session  Pertinent Vitals/Pain Pain Assessment: Faces Faces Pain Scale: Hurts little more Pain Location: abdomen and L flank Pain Descriptors / Indicators: Tender;Aching;Discomfort;Constant Pain  Intervention(s): Limited activity within patient's tolerance;Monitored during session;Premedicated before session;Repositioned HR WFL during sessions activities.    Home Living Family/patient expects to be discharged to:: Private residence Living Arrangements: Alone Available Help at Discharge: Family;Available PRN/intermittently Type of Home: House Home Access: Stairs to enter   [employer] of Steps: 2-3 steps with R railing (but railing blocked by storm door when opened)   Home Layout: Two level;Able to live on main level with bedroom/bathroom Home Equipment: Grab bars - tub/shower;Cane - Surveyor, quantity (2 wheels);Rollator (4 wheels);Shower seat Additional Comments: Pt's daughter lives close    Prior Function Prior Level of Function : Independent/Modified Independent             Mobility Comments: Ambulatory with quad cane.  3 falls in past 6 months (1 in January, 1 in May, and recent fall in October). ADLs Comments: Pt's daughter comes to visit pt daily and pt receives assist with medication management.     Hand Dominance        Extremity/Trunk Assessment   Upper Extremity Assessment Upper Extremity Assessment: Generalized weakness    Lower Extremity Assessment Lower Extremity Assessment: Generalized weakness       Communication   Communication: No difficulties  Cognition Arousal/Alertness: Awake/alert Behavior During Therapy: WFL for tasks assessed/performed Overall Cognitive Status: Within Functional Limits for tasks assessed                                 General Comments: A&Ox4 but pt appearing confused with certain details (ex: home set-up and # of falls--pt's daughter provided information as needed)        General Comments General comments (skin integrity, edema, etc.): bruising noted pt's L lateral trunk.  Nursing cleared pt for participation in physical therapy.  Pt agreeable to PT session.  Pt's 2 daughters present  beginning/end of session (left during session though).    Exercises     Assessment/Plan    PT Assessment Patient needs continued PT services  PT Problem List Decreased strength;Decreased activity tolerance;Decreased balance;Decreased mobility;Decreased knowledge of use of DME;Cardiopulmonary status limiting activity;Decreased knowledge of precautions;Pain       PT Treatment Interventions DME instruction;Gait training;Stair training;Functional mobility training;Therapeutic activities;Therapeutic exercise;Balance training;Patient/family education    PT Goals (Current goals can be found in the Care Plan section)  Acute Rehab PT Goals Patient Stated Goal: to improve pain and mobility PT Goal Formulation: With patient Time For Goal Achievement: 10/26/21 Potential to Achieve Goals: Good    Frequency Min 2X/week   Barriers to discharge        Co-evaluation               AM-PAC PT "6 Clicks" Mobility  Outcome Measure Help needed turning from your back to your side while in a flat bed without using bedrails?: None Help needed moving from lying on your back to sitting on the side of a flat bed without using bedrails?: A Little Help needed moving to and from a bed to a chair (including a wheelchair)?: A Little Help needed standing up from a chair using your arms (e.g., wheelchair or bedside chair)?: A Little Help needed to walk in hospital room?: A Little Help needed climbing 3-5 steps with a railing? : A Little 6 Click Score: 19  End of Session Equipment Utilized During Treatment: Gait belt Activity Tolerance: Other (comment) (Limited ambulation d/t SOB) Patient left: in chair;with call bell/phone within reach;with chair alarm set;with family/visitor present Nurse Communication: Mobility status;Precautions PT Visit Diagnosis: Muscle weakness (generalized) (M62.81);History of falling (Z91.81);Other abnormalities of gait and mobility (R26.89);Pain    Time: 1117-1202 PT  Time Calculation (min) (ACUTE ONLY): 45 min   Charges:   PT Evaluation $PT Eval Low Complexity: 1 Low PT Treatments $Therapeutic Activity: 23-37 mins       Leitha Bleak, PT 10/12/21, 2:15 PM

## 2021-10-12 NOTE — Progress Notes (Signed)
[facility] NAME: Nuha Degner    MR#:  [id number]  DATE OF BIRTH:  Sep 09, 1926  SUBJECTIVE:  patient came in after she had a fall at home on 17 October. Proceeding emergency room found to have left five rib fractures. She was sent home on PO pain meds. came in with increasing shortness of breath, chest pain and big bruise on the left rib cage.  Patient alert and oriented ,no fever.dters\ at bedside. Complains of ribs hurting.  REVIEW OF SYSTEMS:   Review of Systems  Constitutional:  Negative for chills, fever and weight loss.  HENT:  Negative for ear discharge, ear pain and nosebleeds.   Eyes:  Negative for blurred vision, pain and discharge.  Respiratory:  Positive for shortness of breath. Negative for sputum production, wheezing and stridor.   Cardiovascular:  Positive for chest pain. Negative for palpitations, orthopnea and PND.  Gastrointestinal:  Negative for abdominal pain, diarrhea, nausea and vomiting.  Genitourinary:  Negative for frequency and urgency.  Musculoskeletal:  Negative for back pain and joint pain.  Neurological:  Positive for weakness. Negative for sensory change, speech change and focal weakness.  Psychiatric/Behavioral:  Negative for depression and hallucinations. The patient is not nervous/anxious.   Tolerating Diet:yes Tolerating PT: pending  DRUG ALLERGIES:   Allergies  Allergen Reactions   Bactrim [Sulfamethoxazole-Trimethoprim] Other (See Comments)    Reaction:  Unknown    Ciprocinonide [Fluocinolone] Other (See Comments)    Reaction:  Unknown    Ciprofloxacin Other (See Comments)   Codeine    Lipitor [Atorvastatin] Other (See Comments)    Reaction:  Leg cramps    Macrobid [Nitrofurantoin Monohyd Macro] Other (See Comments)    Reaction:  Unknown    Paxil [Paroxetine Hcl] Other (See Comments)    Reaction:  Made pt "loopy"    VITALS:  Blood pressure (!) 108/54, pulse 65, temperature 98.1 F  (36.7 C), resp. rate 16, height 5\' 7"  (1.702 m), weight 56.3 kg, SpO2 94 %.  PHYSICAL EXAMINATION:   Physical Exam  GENERAL:  87 y.o.-year-old patient lying in the bed with no acute distress.   LUNGS: Decreased  breath sounds bilaterally, no wheezing, rales, rhonchi. No use of accessory muscles of respiration.  Large bruise on the left rib cage  CARDIOVASCULAR: S1, S2 normal. No murmurs, rubs, or gallops.  ABDOMEN: Soft, nontender, nondistended. Bowel sounds present. No organomegaly or mass.  EXTREMITIES: No cyanosis, clubbing or edema b/l.    NEUROLOGIC: Cranial nerves II through XII are intact. No focal Motor or sensory deficits b/l.  Generalized weakness  PSYCHIATRIC:  patient is alert and oriented x 3.  SKIN: No obvious rash, lesion, or ulcer.   LABORATORY PANEL:  CBC Recent Labs  Lab 10/11/21 0604  WBC 10.9*  HGB 10.0*  HCT 30.6*  PLT 138*     Chemistries  Recent Labs  Lab 10/10/21 0913 10/11/21 0604  NA 135 137  K 4.4 4.8  CL 98 106  CO2 26 22  GLUCOSE 155* 81  BUN 32* 32*  CREATININE 1.82* 1.69*  CALCIUM 9.0 8.6*  AST 20  --   ALT 13  --   ALKPHOS 81  --   BILITOT 1.0  --     Cardiac Enzymes No results for input(s): TROPONINI in the last 168 hours. RADIOLOGY:  No results found. ASSESSMENT AND PLAN:   Neeya Prigmore is a 87 y.o. female with medical history significant of hypertension, GERD,  CKD-4, CAD, CABG, AICD, atrial fibrillation on Coumadin, diverticulosis, dementia, AAA, who presents with cough, shortness breath, nausea, vomiting, abdominal pain, left chest wall pain.  Patient fell on 10/17 at home and injured her left side of the chest, developed a big hematoma in the left flank area.  Patient was seen in ED on 10/08/2021, and found to have 5 rib fractures. Pt continues to have left-sided chest wall pain.  Sepsis due to multifocal pneumonia:  --pt has multifocal pneumonia as shown by CT scan of abdomen/pelvis.  Patient meets criteria for sepsis  with WBC 15.0, tachypnea with RR 23.  Pending lactic acid.  Currently hemodynamically stable.  Patient has new oxygen requirement currently on 2 L.  - IV Rocephin and azithromycin - Mucinex for cough  - Bronchodilators -  S. pneumococcal antigen-- negative -- Procalcitonin 0.37  and trend lactic acid level down to 1.1 - patient received IV fluids per sepsis protocol. She is currently afebrile. --10/25--overall improving cont present rx. Change to po abxs in am   Left rib fracture: with left rib cage bruise -Control: As needed Percocet, Tylenol, morphine -Incentive spirometry   Hypertension -IV hydralazine as needed -Metoprolol, amlodipine   GERD (gastroesophageal reflux disease) -Protonix   Coronary artery disease: S/p of CABG.  No chest pain --Crestor --Hold eliquis due to large hematoma. Per dter has fallen 4 times this year. I have asked dter [name] to reach out to pt's [facility] cardiologist and discuss eliquis resumption vs discont -- patient not taking aspirin due falls (per card notes) --hgb stable   Abdominal pain, nausea, vomiting: Etiology is not clear.  May be related to hiatal hernia and multifocal pneumonia. Currently patient does not have active vomiting. -Supportive care, as needed morphine for pain Zofran for nausea -received IV fluid   Hiatal hernia -Protonix   AAA (abdominal aortic aneurysm): Patient has told her daughter that she does not want to consider surgical repair of AAA. -f/u as outpt   CKD (chronic kidney disease), stage IV ([place]): Stable.  Recent baseline creatinine 2.18, her creatinine is 1.82, BUN 32 today. -- Creatinine 1.6   Atrial fibrillation chronic: -Hold Eliquis due to large rib cage bruise --Continue metoprolol, amiodarone   Pleural effusion on left -- continue to monitor. Reviewed CT's chest with radiologist. Patient does not have a large pleural effusion. If conservative management does not help with shortness of breath will consider  thoracentesis. This was discussed with patient's daughter [name]       DVT ppx: SCD Code Status: Full code per pt and her daughter Family Communication: Yes, patient's  daughter at bed side Disposition Plan:  Anticipate discharge back to previous environment Consults called:  none Admission status and Level of care: med-surg    as inpt        PT OT to see today TOC for discharge planning D/c 1-2 days      TOTAL TIME TAKING CARE OF THIS PATIENT: 25 minutes.  >50% time spent on counselling and coordination of care  Note: This dictation was prepared with Dragon dictation along with smaller phrase technology. Any transcriptional errors that result from this process are unintentional.  Fritzi Mandes M.D    [facility]   CC: Primary care physician; Louisa, Dola Argyle, MD Patient ID: Vanetta Shawl, female   DOB: [birth year], 87 y.o.   MRN: [id number]

## 2021-10-13 DIAGNOSIS — I714 Abdominal aortic aneurysm, without rupture, unspecified: Secondary | ICD-10-CM | POA: Diagnosis not present

## 2021-10-13 DIAGNOSIS — N189 Chronic kidney disease, unspecified: Secondary | ICD-10-CM

## 2021-10-13 DIAGNOSIS — I2510 Atherosclerotic heart disease of native coronary artery without angina pectoris: Secondary | ICD-10-CM

## 2021-10-13 DIAGNOSIS — J90 Pleural effusion, not elsewhere classified: Secondary | ICD-10-CM

## 2021-10-13 DIAGNOSIS — S2249XA Multiple fractures of ribs, unspecified side, initial encounter for closed fracture: Secondary | ICD-10-CM

## 2021-10-13 DIAGNOSIS — I482 Chronic atrial fibrillation, unspecified: Secondary | ICD-10-CM | POA: Diagnosis not present

## 2021-10-13 DIAGNOSIS — N179 Acute kidney failure, unspecified: Secondary | ICD-10-CM

## 2021-10-13 DIAGNOSIS — J189 Pneumonia, unspecified organism: Secondary | ICD-10-CM | POA: Diagnosis not present

## 2021-10-13 LAB — COMPREHENSIVE METABOLIC PANEL
ALT: 13 U/L (ref 0–44)
AST: 16 U/L (ref 15–41)
Albumin: 2.7 g/dL — ABNORMAL LOW (ref 3.5–5.0)
Alkaline Phosphatase: 83 U/L (ref 38–126)
Anion gap: 7 (ref 5–15)
BUN: 25 mg/dL — ABNORMAL HIGH (ref 8–23)
CO2: 24 mmol/L (ref 22–32)
Calcium: 8.7 mg/dL — ABNORMAL LOW (ref 8.9–10.3)
Chloride: 106 mmol/L (ref 98–111)
Creatinine, Ser: 1.5 mg/dL — ABNORMAL HIGH (ref 0.44–1.00)
GFR, Estimated: 32 mL/min — ABNORMAL LOW (ref >60)
Glucose, Bld: 113 mg/dL — ABNORMAL HIGH (ref 70–99)
Potassium: 4.6 mmol/L (ref 3.5–5.1)
Sodium: 137 mmol/L (ref 135–145)
Total Bilirubin: 0.6 mg/dL (ref 0.3–1.2)
Total Protein: 5.7 g/dL — ABNORMAL LOW (ref 6.5–8.1)

## 2021-10-13 LAB — CBC WITH DIFFERENTIAL/PLATELET
Abs Immature Granulocytes: 0.06 10*3/uL (ref 0.00–0.07)
Basophils Absolute: 0 10*3/uL (ref 0.0–0.1)
Basophils Relative: 0 %
Eosinophils Absolute: 0.2 10*3/uL (ref 0.0–0.5)
Eosinophils Relative: 3 %
HCT: 28.1 % — ABNORMAL LOW (ref 36.0–46.0)
Hemoglobin: 9.5 g/dL — ABNORMAL LOW (ref 12.0–15.0)
Immature Granulocytes: 1 %
Lymphocytes Relative: 10 %
Lymphs Abs: 0.7 10*3/uL (ref 0.7–4.0)
MCH: 33.8 pg (ref 26.0–34.0)
MCHC: 33.8 g/dL (ref 30.0–36.0)
MCV: 100 fL (ref 80.0–100.0)
Monocytes Absolute: 0.7 10*3/uL (ref 0.1–1.0)
Monocytes Relative: 10 %
Neutro Abs: 5.2 10*3/uL (ref 1.7–7.7)
Neutrophils Relative %: 76 %
Platelets: 141 10*3/uL — ABNORMAL LOW (ref 150–400)
RBC: 2.81 MIL/uL — ABNORMAL LOW (ref 3.87–5.11)
RDW: 14 % (ref 11.5–15.5)
WBC: 6.9 10*3/uL (ref 4.0–10.5)
nRBC: 0 % (ref 0.0–0.2)

## 2021-10-13 LAB — MAGNESIUM: Magnesium: 1.7 mg/dL (ref 1.7–2.4)

## 2021-10-13 LAB — PHOSPHORUS: Phosphorus: 3 mg/dL (ref 2.5–4.6)

## 2021-10-13 LAB — HEMOGLOBIN: Hemoglobin: 9.5 g/dL — ABNORMAL LOW (ref 12.0–15.0)

## 2021-10-13 MED ORDER — AZITHROMYCIN 500 MG PO TABS
250.0000 mg | ORAL_TABLET | Freq: Every day | ORAL | Status: DC
  Administered 2021-10-14 – 2021-10-15 (×2): 250 mg via ORAL
  Filled 2021-10-13 (×2): qty 1

## 2021-10-13 MED ORDER — BISACODYL 5 MG PO TBEC
5.0000 mg | DELAYED_RELEASE_TABLET | Freq: Every day | ORAL | Status: DC | PRN
  Administered 2021-10-13: 18:00:00 10 mg via ORAL
  Filled 2021-10-13: qty 2

## 2021-10-13 MED ORDER — SORBITOL 70 % SOLN
400.0000 mL | TOPICAL_OIL | Freq: Once | ORAL | Status: AC
  Administered 2021-10-13: 400 mL via RECTAL
  Filled 2021-10-13: qty 120

## 2021-10-13 MED ORDER — MAGNESIUM SULFATE 2 GM/50ML IV SOLN
2.0000 g | Freq: Once | INTRAVENOUS | Status: AC
  Administered 2021-10-13: 2 g via INTRAVENOUS
  Filled 2021-10-13: qty 50

## 2021-10-13 MED ORDER — CEFDINIR 300 MG PO CAPS
300.0000 mg | ORAL_CAPSULE | Freq: Every day | ORAL | Status: DC
  Administered 2021-10-14 – 2021-10-15 (×2): 300 mg via ORAL
  Filled 2021-10-13 (×2): qty 1

## 2021-10-13 MED ORDER — SORBITOL 70 % SOLN
300.0000 mL | TOPICAL_OIL | Freq: Every day | ORAL | Status: DC | PRN
  Filled 2021-10-13: qty 90

## 2021-10-13 NOTE — Evaluation (Signed)
Occupational Therapy Evaluation Patient Details Name: Claire Kelley MRN: [id number] DOB: 03/03/1926 Today's Date: 10/13/2021   History of Present Illness Pt is a 87 y.o. female presenting to hospital 10/23 with worsening pain in L chest and abdomen (pt fell about 6 days prior and had presented to ED where she was found to have 5 rib fx's but has had a difficult time managing at home since discharge home); now with nausea and frequent vomiting.  Pt admitted with sepsis d/t multifocal PNA, L rib fx's, L pleural effusion, abdominal pain, and nausea/vomiting.  PMH includes a-fib, CAD, gastric reflux, htn, HLD, AICD, abdominal aortic aneurysm repair, CABG.   Clinical Impression   Patient presenting with decreased Ind in self care, balance, functional mobility/transfers, endurance, and safety awareness. Patient lives at home independently with use of cane for functional mobility PTA. Pt has family support. Patient currently functioning at min guard - min A without use of AD this session for functional transfer, LB clothing management, toileting, and standing at sink for grooming tasks. Pt on 3 Ls O2 at end of session with O2 saturation at 95%. HR does increase to 120's with functional tasks. Patient will benefit from acute OT to increase overall independence in the areas of ADLs, functional mobility, and safety awareness in order to safely discharge to next venue of care.       Recommendations for follow up therapy are one component of a multi-disciplinary discharge planning process, led by the attending physician.  Recommendations may be updated based on patient status, additional functional criteria and insurance authorization.   Follow Up Recommendations  Home health OT    Assistance Recommended at Discharge Intermittent Supervision/Assistance  Functional Status Assessment  Patient has had a recent decline in their functional status and demonstrates the ability to make significant improvements in  function in a reasonable and predictable amount of time.  Equipment Recommendations  None recommended by OT       Precautions / Restrictions Precautions Precautions: Fall Precaution Comments: rib fractures      Mobility Bed Mobility Overal bed mobility: Needs Assistance Bed Mobility: Supine to Sit;Sit to Supine     Supine to sit: Supervision;HOB elevated Sit to supine: Supervision;HOB elevated   General bed mobility comments: increased effort but no physical assistance provided    Transfers Overall transfer level: Needs assistance Equipment used: 1 person hand held assist Transfers: Sit to/from Stand;Stand Pivot Transfers Sit to Stand: Min guard Stand pivot transfers: Min guard                Balance Overall balance assessment: Needs assistance Sitting-balance support: No upper extremity supported;Feet supported Sitting balance-Leahy Scale: Good     Standing balance support: Bilateral upper extremity supported;During functional activity Standing balance-Leahy Scale: Good Standing balance comment: no loss of balance noted during ambulation/functional activities during session                           ADL either performed or assessed with clinical judgement   ADL Overall ADL's : Needs assistance/impaired     Grooming: Wash/dry hands;Standing;Min guard                   Toilet Transfer: Min guard;BSC   Toileting- Water quality scientist and Hygiene: Min guard;Sit to/from stand       Functional mobility during ADLs: Min guard;Supervision/safety General ADL Comments: performed functional tasks and transfers with min guard for balance     Vision Patient  Visual Report: No change from baseline              Pertinent Vitals/Pain Pain Assessment: 0-10 Pain Score: 2  Pain Location: abdomen Pain Descriptors / Indicators: Tender;Aching;Discomfort;Constant Pain Intervention(s): Limited activity within patient's tolerance;Monitored during  session;Repositioned     Hand Dominance Right   Extremity/Trunk Assessment Upper Extremity Assessment Upper Extremity Assessment: Generalized weakness   Lower Extremity Assessment Lower Extremity Assessment: Generalized weakness   Cervical / Trunk Assessment Cervical / Trunk Assessment: Normal   Communication Communication Communication: No difficulties   Cognition Arousal/Alertness: Awake/alert Behavior During Therapy: WFL for tasks assessed/performed Overall Cognitive Status: Within Functional Limits for tasks assessed                                 General Comments: Pt is A & O x4. She is very pleasant and cooperative. Daughter's present in room.                Home Living Family/patient expects to be discharged to:: Private residence Living Arrangements: Alone Available Help at Discharge: Family;Available PRN/intermittently Type of Home: House Home Access: Stairs to enter [employer] of Steps: 2-3 steps with R railing (but railing blocked by storm door when opened)   Home Layout: Two level;Able to live on main level with bedroom/bathroom     Bathroom Shower/Tub: Occupational psychologist: Standard     Home Equipment: Grab bars - tub/shower;Cane - Surveyor, quantity (2 wheels);Rollator (4 wheels);Shower seat   Additional Comments: Pt's daughter lives close      Prior Functioning/Environment Prior Level of Function : Independent/Modified Independent             Mobility Comments: Ambulatory with quad cane.  3 falls in past 6 months (1 in January, 1 in May, and recent fall in October). ADLs Comments: Pt's daughter comes to visit pt daily and pt receives assist with medication management.        OT Problem List: Decreased strength;Decreased activity tolerance;Impaired balance (sitting and/or standing);Decreased safety awareness;Cardiopulmonary status limiting activity      OT Treatment/Interventions:  Self-care/ADL training;Manual therapy;Therapeutic exercise;Modalities;Patient/family education;Balance training;Energy conservation;Therapeutic activities;DME and/or AE instruction    OT Goals(Current goals can be found in the care plan section) Acute Rehab OT Goals Patient Stated Goal: to go home OT Goal Formulation: With patient/family Time For Goal Achievement: 10/27/21 Potential to Achieve Goals: Good ADL Goals Pt Will Perform Lower Body Dressing: with supervision;sit to/from stand Pt Will Transfer to Toilet: with supervision;ambulating Pt Will Perform Toileting - Clothing Manipulation and hygiene: with supervision;sit to/from stand  OT Frequency: Min 2X/week   Barriers to D/C:    none known at this time. Family very supportive.          AM-PAC OT "6 Clicks" Daily Activity     Outcome Measure Help from another person eating meals?: None Help from another person taking care of personal grooming?: A Little Help from another person toileting, which includes using toliet, bedpan, or urinal?: A Little Help from another person bathing (including washing, rinsing, drying)?: A Little Help from another person to put on and taking off regular upper body clothing?: None Help from another person to put on and taking off regular lower body clothing?: A Little 6 Click Score: 20   End of Session Equipment Utilized During Treatment: Oxygen (3Ls) Nurse Communication: Mobility status  Activity Tolerance: Patient tolerated treatment well;Patient limited by fatigue  Patient left: with call bell/phone within reach;in bed  OT Visit Diagnosis: Unsteadiness on feet (R26.81);Repeated falls (R29.6);Muscle weakness (generalized) (M62.81)                Time: 1749-4496 OT Time Calculation (min): 31 min Charges:  OT General Charges $OT Visit: 1 Visit OT Evaluation $OT Eval Low Complexity: 1 Low OT Treatments $Self Care/Home Management : 23-37 mins  Darleen Crocker, MS, OTR/L , CBIS ascom  [phone]  10/13/21, 1:15 PM

## 2021-10-13 NOTE — Progress Notes (Signed)
PROGRESS NOTE    Claire Kelley  NIO:[id number] DOB: 02-29-1926 DOA: 10/10/2021 PCP: Irish Elders, MD   Brief Narrative:  Claire Kelley is a 87 y.o. WF PMHx HTN, GERD, CKD-4, CAD, CABG, AICD, atrial fibrillation on Coumadin, diverticulosis, dementia, AAA, who presents with cough, shortness breath, nausea, vomiting, abdominal pain, left chest wall pain.  Patient fell on 10/17 at home and injured her left side of the chest, developed a big hematoma in the left flank area.  Patient was seen in ED on 10/08/2021, and found to have 5 rib fractures. Pt continues to have left-sided chest wall pain.   Subjective: A/O x4, only complaint is constipation.   Assessment & Plan:  Covid vaccination;  Principal Problem:   Multifocal pneumonia Active Problems:   Hypertension   GERD (gastroesophageal reflux disease)   Coronary artery disease   Atrial fibrillation, chronic (HCC)   Abdominal pain   Hiatal hernia   AAA (abdominal aortic aneurysm)   Left rib fracture   Sepsis ([place])   CKD (chronic kidney disease), stage IV (HCC)   Pleural effusion on left   Sepsis due to multifocal pneumonia:  --pt has multifocal pneumonia as shown by CT scan of abdomen/pelvis.  Patient meets criteria for sepsis with WBC 15.0, tachypnea with RR 23.  Pending lactic acid.  Currently hemodynamically stable.  Patient has new oxygen requirement currently on 2 L.  - IV Rocephin and azithromycin - Mucinex for cough  - Bronchodilators -  S. pneumococcal antigen-- negative -- Procalcitonin 0.37  and trend lactic acid level down to 1.1 - patient received IV fluids per sepsis protocol. She is currently afebrile. -- 10/26 convert IV antibiotic to p.o. antibiotic to complete course of medication.   Left rib fracture: with left rib cage bruise -Control: As needed Percocet, Tylenol, morphine -Incentive spirometry   Hypertension -IV hydralazine as needed -Metoprolol, amlodipine   GERD (gastroesophageal reflux  disease) -Protonix   Coronary artery disease: S/p of CABG.  No chest pain --Crestor --Hold eliquis due to large hematoma. Per dter has fallen 4 times this year. I have asked dter [name] to reach out to pt's [facility] cardiologist and discuss eliquis resumption vs discont -- patient not taking aspirin due falls (per card notes) --hgb stable   Abdominal pain, nausea, vomiting: Etiology is not clear.  May be related to hiatal hernia and multifocal pneumonia. Currently patient does not have active vomiting. -Supportive care, as needed morphine for pain Zofran for nausea -received IV fluid   Hiatal hernia -Protonix   AAA (abdominal aortic aneurysm):  -Patient has told her daughter that she does not want to consider surgical repair of AAA. -f/u as outpt   Acute on CKD stage unspecified -Unsure of patient's CKD status as previous BMP 6 years ago, but given patient's renal improvement not CKD stage IV  Lab Results  Component Value Date   CREATININE 1.44 (H) 10/14/2021   CREATININE 1.50 (H) 10/13/2021   CREATININE 1.69 (H) 10/11/2021   CREATININE 1.82 (H) 10/10/2021   CREATININE 2.18 (H) 10/08/2021     Atrial fibrillation chronic: -Hold Eliquis due to large rib cage bruise.  Restart prior to discharge --Continue metoprolol, amiodarone   Pleural effusion on left -- continue to monitor. Reviewed CT's chest with radiologist. Patient does not have a large pleural effusion. If conservative management does not help with shortness of breath will consider thoracentesis. This was discussed with patient's daughter [name] - Magnesium goal>2 -10/26 magnesium IV 2 g  DVT prophylaxis: SCD Code Status: Full Family Communication: 10/26 2 daughters at bedside discussed plan of care all questions answered Status is: Inpatient    Dispo: The patient is from: Home              Anticipated d/c is to: Home              Anticipated d/c date is: 2 days              Patient  currently is not medically stable to d/c.      Consultants:    Procedures/Significant Events:     I have personally reviewed and interpreted all radiology studies and my findings are as above.  VENTILATOR SETTINGS:    Cultures   Antimicrobials: Anti-infectives (From admission, onward)    Start     Dose/Rate Route Frequency Ordered Stop   10/14/21 1000  azithromycin (ZITHROMAX) tablet 250 mg        250 mg Oral Daily 10/13/21 2130     10/14/21 1000  cefdinir (OMNICEF) capsule 300 mg        300 mg Oral Daily 10/13/21 2130     10/11/21 1100  azithromycin (ZITHROMAX) 500 mg in sodium chloride 0.9 % 250 mL IVPB  Status:  Discontinued        500 mg 250 mL/hr over 60 Minutes Intravenous Every 24 hours 10/10/21 1145 10/13/21 2130   10/11/21 1100  cefTRIAXone (ROCEPHIN) 2 g in sodium chloride 0.9 % 100 mL IVPB  Status:  Discontinued        2 g 200 mL/hr over 30 Minutes Intravenous Every 24 hours 10/10/21 1145 10/13/21 2130   10/10/21 1115  cefTRIAXone (ROCEPHIN) 1 g in sodium chloride 0.9 % 100 mL IVPB        1 g 200 mL/hr over 30 Minutes Intravenous  Once 10/10/21 1111 10/10/21 1215   10/10/21 1115  azithromycin (ZITHROMAX) 500 mg in sodium chloride 0.9 % 250 mL IVPB        500 mg 250 mL/hr over 60 Minutes Intravenous  Once 10/10/21 1111 10/10/21 1329         Devices    LINES / TUBES:      Continuous Infusions:  azithromycin Stopped (10/12/21 1110)   cefTRIAXone (ROCEPHIN)  IV Stopped (10/12/21 0955)     Objective: Vitals:   10/12/21 2100 10/12/21 2118 10/13/21 0010 10/13/21 0415  BP:  132/67 (!) 158/70 (!) 138/58  Pulse:  66 94 73  Resp:  (!) 21 18 (!) 22  Temp:  98.4 F (36.9 C) 98.3 F (36.8 C) 98 F (36.7 C)  TempSrc:  Oral  Oral  SpO2: 97% 97% 94% 94%  Weight:      Height:        Intake/Output Summary (Last 24 hours) at 10/13/2021 5170 Last data filed at 10/12/2021 1510 Gross per 24 hour  Intake 962.62 ml  Output --  Net 962.62 ml    Filed Weights   10/10/21 0908  Weight: 56.3 kg    Examination:  General: A/O x4, positive acute respiratory distress Eyes: negative scleral hemorrhage, negative anisocoria, negative icterus ENT: Negative Runny nose, negative gingival bleeding, Neck:  Negative scars, masses, torticollis, lymphadenopathy, JVD Lungs: Clear to auscultation bilaterally without wheezes or crackles, AICD present left chest wall Cardiovascular: Irregularly irregular rhythm and rate without murmur gallop or rub normal S1 and S2 Abdomen: negative abdominal pain, nondistended, positive soft, bowel sounds, no rebound, no ascites, no appreciable mass Extremities: No  significant cyanosis, clubbing, or edema bilateral lower extremities Skin: Negative rashes, lesions, ulcers Psychiatric:  Negative depression, negative anxiety, negative fatigue, negative mania  Central nervous system:  Cranial nerves II through XII intact, tongue/uvula midline, all extremities muscle strength 5/5, sensation intact throughout, negative dysarthria, negative expressive aphasia, negative receptive aphasia.  .     Data Reviewed: Care during the described time interval was provided by me .  I have reviewed this patient's available data, including medical history, events of note, physical examination, and all test results as part of my evaluation.   CBC: Recent Labs  Lab 10/08/21 1229 10/10/21 0913 10/11/21 0604 10/13/21 0402  WBC 7.8 15.0* 10.9*  --   HGB 11.3* 11.8* 10.0* 9.5*  HCT 33.2* 35.4* 30.6*  --   MCV 101.8* 104.1* 103.4*  --   PLT 146* 146* 138*  --    Basic Metabolic Panel: Recent Labs  Lab 10/08/21 1229 10/10/21 0913 10/11/21 0604  NA 136 135 137  K 4.3 4.4 4.8  CL 104 98 106  CO2 25 26 22   GLUCOSE 122* 155* 81  BUN 32* 32* 32*  CREATININE 2.18* 1.82* 1.69*  CALCIUM 9.0 9.0 8.6*   GFR: Estimated Creatinine Clearance: 18.5 mL/min (A) (by C-G formula based on SCr of 1.69 mg/dL (H)). Liver Function  Tests: Recent Labs  Lab 10/08/21 1229 10/10/21 0913  AST 18 20  ALT 16 13  ALKPHOS 81 81  BILITOT 1.0 1.0  PROT 6.5 6.8  ALBUMIN 3.5 3.5   Recent Labs  Lab 10/10/21 1347  LIPASE 54*   No results for input(s): AMMONIA in the last 168 hours. Coagulation Profile: Recent Labs  Lab 10/08/21 1229  INR 1.3*   Cardiac Enzymes: No results for input(s): CKTOTAL, CKMB, CKMBINDEX, TROPONINI in the last 168 hours. BNP (last 3 results) No results for input(s): PROBNP in the last 8760 hours. HbA1C: No results for input(s): HGBA1C in the last 72 hours. CBG: No results for input(s): GLUCAP in the last 168 hours. Lipid Profile: No results for input(s): CHOL, HDL, LDLCALC, TRIG, CHOLHDL, LDLDIRECT in the last 72 hours. Thyroid Function Tests: No results for input(s): TSH, T4TOTAL, FREET4, T3FREE, THYROIDAB in the last 72 hours. Anemia Panel: No results for input(s): VITAMINB12, FOLATE, FERRITIN, TIBC, IRON, RETICCTPCT in the last 72 hours. Urine analysis:    Component Value Date/Time   COLORURINE YELLOW (A) 10/10/2021 2212   APPEARANCEUR HAZY (A) 10/10/2021 2212   LABSPEC 1.024 10/10/2021 2212   PHURINE 5.0 10/10/2021 2212   GLUCOSEU NEGATIVE 10/10/2021 2212   HGBUR NEGATIVE 10/10/2021 2212   BILIRUBINUR NEGATIVE 10/10/2021 2212   KETONESUR NEGATIVE 10/10/2021 2212   PROTEINUR NEGATIVE 10/10/2021 2212   NITRITE POSITIVE (A) 10/10/2021 2212   LEUKOCYTESUR LARGE (A) 10/10/2021 2212   Sepsis Labs: @LABRCNTIP (procalcitonin:4,lacticidven:4)  ) Recent Results (from the past 240 hour(s))  Resp Panel by RT-PCR (Flu A&B, Covid) Nasopharyngeal Swab     Status: None   Collection Time: 10/10/21  9:29 AM   Specimen: Nasopharyngeal Swab; Nasopharyngeal(NP) swabs in vial transport medium  Result Value Ref Range Status   SARS Coronavirus 2 by RT PCR NEGATIVE NEGATIVE Final    Comment: (NOTE) SARS-CoV-2 target nucleic acids are NOT DETECTED.  The SARS-CoV-2 RNA is generally detectable  in upper respiratory specimens during the acute phase of infection. The lowest concentration of SARS-CoV-2 viral copies this assay can detect is 138 copies/mL. A negative result does not preclude SARS-Cov-2 infection and should not be used as the  sole basis for treatment or other patient management decisions. A negative result may occur with  improper specimen collection/handling, submission of specimen other than nasopharyngeal swab, presence of viral mutation(s) within the areas targeted by this assay, and inadequate number of viral copies(<138 copies/mL). A negative result must be combined with clinical observations, patient history, and epidemiological information. The expected result is Negative.  Fact Sheet for Patients:  EntrepreneurPulse.com.au  Fact Sheet for Healthcare Providers:  IncredibleEmployment.be  This test is no t yet approved or cleared by the [place] FDA and  has been authorized for detection and/or diagnosis of SARS-CoV-2 by FDA under an Emergency Use Authorization (EUA). This EUA will remain  in effect (meaning this test can be used) for the duration of the COVID-19 declaration under Section 564(b)(1) of the Act, 21 U.S.C.section 360bbb-3(b)(1), unless the authorization is terminated  or revoked sooner.       Influenza A by PCR NEGATIVE NEGATIVE Final   Influenza B by PCR NEGATIVE NEGATIVE Final    Comment: (NOTE) The Xpert Xpress SARS-CoV-2/FLU/RSV plus assay is intended as an aid in the diagnosis of influenza from Nasopharyngeal swab specimens and should not be used as a sole basis for treatment. Nasal washings and aspirates are unacceptable for Xpert Xpress SARS-CoV-2/FLU/RSV testing.  Fact Sheet for Patients: EntrepreneurPulse.com.au  Fact Sheet for Healthcare Providers: IncredibleEmployment.be  This test is not yet approved or cleared by the [place] FDA and has  been authorized for detection and/or diagnosis of SARS-CoV-2 by FDA under an Emergency Use Authorization (EUA). This EUA will remain in effect (meaning this test can be used) for the duration of the COVID-19 declaration under Section 564(b)(1) of the Act, 21 U.S.C. section 360bbb-3(b)(1), unless the authorization is terminated or revoked.  Performed at [facility], [address] [place], [place] [address]   Blood culture (routine x 2)     Status: None (Preliminary result)   Collection Time: 10/10/21 11:31 AM   Specimen: BLOOD  Result Value Ref Range Status   Specimen Description BLOOD LEFT [facility]  Final   Special Requests   Final    BOTTLES DRAWN AEROBIC AND ANAEROBIC Blood Culture adequate volume   Culture   Final    NO GROWTH 2 DAYS Performed at [facility], [address], [place], [place] [address]    Report Status PENDING  Incomplete  Blood culture (routine x 2)     Status: None (Preliminary result)   Collection Time: 10/10/21 11:31 AM   Specimen: BLOOD  Result Value Ref Range Status   Specimen Description BLOOD RIGHT [facility]  Final   Special Requests   Final    BOTTLES DRAWN AEROBIC AND ANAEROBIC Blood Culture adequate volume   Culture   Final    NO GROWTH 2 DAYS Performed at [facility], [address] [place], [place] [address]    Report Status PENDING  Incomplete         Radiology Studies: No results found.      Scheduled Meds:  amiodarone  100 mg Oral Daily   cholecalciferol  1,000 Units Oral Daily   donepezil  5 mg Oral Daily   ferrous sulfate   Oral Daily   metoprolol succinate  12.5 mg Oral Daily   multivitamin with minerals  1 tablet Oral Daily   oxybutynin  5 mg Oral QHS   pantoprazole  40 mg Oral Daily   rosuvastatin  5 mg Oral Daily   vitamin B-12  100 mcg  Oral Daily   Continuous Infusions:  azithromycin Stopped (10/12/21 1110)   cefTRIAXone (ROCEPHIN)  IV Stopped (10/12/21 0955)     LOS: 3 days   The  patient is critically ill with multiple organ systems failure and requires high complexity decision making for assessment and support, frequent evaluation and titration of therapies, application of advanced monitoring technologies and extensive interpretation of multiple databases. Critical Care Time devoted to patient care services described in this note  Time spent: 40 minutes     Lonisha Bobby, Geraldo Docker, MD [facility]   If 7PM-7AM, please contact night-coverage 10/13/2021, 7:24 AM

## 2021-10-13 NOTE — Progress Notes (Signed)
Physical Therapy Treatment Patient Details Name: Claire Kelley MRN: [id number] DOB: 07-06-1926 Today's Date: 10/13/2021   History of Present Illness Pt is a 87 y.o. female presenting to hospital 10/23 with worsening pain in L chest and abdomen (pt fell about 6 days prior and had presented to ED where she was found to have 5 rib fx's but has had a difficult time managing at home since discharge home); now with nausea and frequent vomiting.  Pt admitted with sepsis d/t multifocal PNA, L rib fx's, L pleural effusion, abdominal pain, and nausea/vomiting.  PMH includes a-fib, CAD, gastric reflux, htn, HLD, AICD, abdominal aortic aneurysm repair, CABG.    PT Comments    Pt is making good progress towards goals with ability to ambulate further distance this date. Safe technique using RW with no LOB during turns. Does fatigue quickly but reports no pain with exertion. All mobility performed on 3L of O2 with sats above 90% throughout. HR stable. Will continue to progress as able.  Recommendations for follow up therapy are one component of a multi-disciplinary discharge planning process, led by the attending physician.  Recommendations may be updated based on patient status, additional functional criteria and insurance authorization.  Follow Up Recommendations  Home health PT     Assistance Recommended at Discharge Set up Supervision/Assistance  Equipment Recommendations  Rolling walker (2 wheels);3in1 (PT)    Recommendations for Other Services OT consult     Precautions / Restrictions Precautions Precautions: Fall Restrictions Weight Bearing Restrictions: No     Mobility  Bed Mobility Overal bed mobility: Needs Assistance Bed Mobility: Supine to Sit;Sit to Supine     Supine to sit: Supervision;HOB elevated Sit to supine: Supervision;HOB elevated   General bed mobility comments: safe technique with no cues required    Transfers Overall transfer level: Needs assistance Equipment  used: Rolling walker (2 wheels) Transfers: Sit to/from [employer] Transfers Sit to Stand: Min guard           General transfer comment: cues for hand placement. Once standing, upright posture noted    Ambulation/Gait Ambulation/Gait assistance: Min guard Gait Distance (Feet): 80 Feet Assistive device: Rolling walker (2 wheels) Gait Pattern/deviations: Step-through pattern     General Gait Details: ambulated laps in room as pt prefers not to go in hallway. All mobility performed on 3L of O2 with sats at 93% post exertion. Does fatigue quickly   Stairs             Wheelchair Mobility    Modified Rankin (Stroke Patients Only)       Balance Overall balance assessment: Needs assistance Sitting-balance support: No upper extremity supported;Feet supported Sitting balance-Leahy Scale: Good     Standing balance support: Bilateral upper extremity supported;During functional activity Standing balance-Leahy Scale: Good Standing balance comment: no loss of balance noted during ambulation/functional activities during session                            Cognition Arousal/Alertness: Awake/alert Behavior During Therapy: WFL for tasks assessed/performed Overall Cognitive Status: Within Functional Limits for tasks assessed                                 General Comments: very pleasant and agreeable to therapy        Exercises Other Exercises Other Exercises: ambulated to [facility] to urinate. Safe technique with pt able to perform self  hygiene with supervision. RW used for transfers    General Comments        Pertinent Vitals/Pain Pain Assessment: No/denies pain    Home Living                          Prior Function            PT Goals (current goals can now be found in the care plan section) Acute Rehab PT Goals Patient Stated Goal: to improve pain and mobility PT Goal Formulation: With patient Time For Goal Achievement:  10/26/21 Potential to Achieve Goals: Good Progress towards PT goals: Progressing toward goals    Frequency    Min 2X/week      PT Plan Current plan remains appropriate    Co-evaluation              AM-PAC PT "6 Clicks" Mobility   Outcome Measure  Help needed turning from your back to your side while in a flat bed without using bedrails?: None Help needed moving from lying on your back to sitting on the side of a flat bed without using bedrails?: None Help needed moving to and from a bed to a chair (including a wheelchair)?: A Little Help needed standing up from a chair using your arms (e.g., wheelchair or bedside chair)?: A Little Help needed to walk in hospital room?: A Little Help needed climbing 3-5 steps with a railing? : A Little 6 Click Score: 20    End of Session Equipment Utilized During Treatment: Gait belt;Oxygen Activity Tolerance: Patient tolerated treatment well Patient left: in bed;with bed alarm set Nurse Communication: Mobility status;Precautions PT Visit Diagnosis: Muscle weakness (generalized) (M62.81);History of falling (Z91.81);Other abnormalities of gait and mobility (R26.89);Pain     Time: 5732-2025 PT Time Calculation (min) (ACUTE ONLY): 23 min  Charges:  $Gait Training: 8-22 mins $Therapeutic Activity: 8-22 mins                     Greggory Stallion, PT, DPT [phone]    Claire Kelley 10/13/2021, 3:38 PM

## 2021-10-13 NOTE — TOC Initial Note (Signed)
Transition of Care [facility]) - Initial/Assessment Note    Patient Details  Name: Claire Kelley MRN: [id number] Date of Birth: [birth year]  Transition of Care [facility] CM/SW Contact:    Shelbie Hutching, RN Phone Number: 10/13/2021, 10:56 AM  Clinical Narrative:                 Patient admitted to the hospital with pneumonia.  Patient fell recently and has several rib fractures and a pleural effusion on acute O2 at 2L.  RNCM met with patient and patient's daughters at the bedside.  Patient lives alone and is independent at home.  She does have a walker but uses her cane more.  Patient does not drive but her daughters provide all transportation.  Patient agrees to home health services, she has used [facility] in the past and would like to use them again.  PT also recommended supervision so the family will hire someone to come in the home to be with the patient.  They have used [facility] in the past and they will reach back out to them.   TOC will cont to follow.    Expected Discharge Plan: [place] Barriers to Discharge: Continued Medical Work up   Patient Goals and CMS Choice Patient states their goals for this hospitalization and ongoing recovery are:: Patient wants her pain to be better and wants to return home when medically ready CMS Medicare.gov Compare Post Acute Care list provided to:: Patient Choice offered to / list presented to : Patient, Adult Children  Expected Discharge Plan and Services Expected Discharge Plan: [place]   Discharge Planning Services: CM Consult Post Acute Care Choice: [place] arrangements for the past 2 months: Single Family Home                 DME Arranged: N/A DME Agency: NA       HH Arranged: RN, PT, OT HH Agency: [facility] Date [place]: 10/13/21 Time HH Agency Contacted: 12 Representative spoke with at [place]: [place] Arrangements/Services Living  arrangements for the past 2 months: [place] with:: Self Patient language and need for interpreter reviewed:: Yes Do you feel safe going back to the place where you live?: Yes      Need for Family Participation in Patient Care: Yes (Comment) (rib fractures) Care giver support system in place?: Yes (comment) (daughters) Current home services: DME (cane and walker) Criminal Activity/Legal Involvement Pertinent to Current Situation/Hospitalization: No - Comment as needed  Activities of Daily Living Home Assistive Devices/Equipment: Cane (specify quad or straight), Walker (specify type) ADL Screening (condition at time of admission) Patient's cognitive ability adequate to safely complete daily activities?: Yes Is the patient deaf or have difficulty hearing?: No Does the patient have difficulty seeing, even when wearing glasses/contacts?: No Does the patient have difficulty concentrating, remembering, or making decisions?: No Patient able to express need for assistance with ADLs?: Yes Does the patient have difficulty dressing or bathing?: No Independently performs ADLs?: Yes (appropriate for developmental age) Does the patient have difficulty walking or climbing stairs?: Yes Weakness of Legs: None Weakness of Arms/Hands: None  Permission Sought/Granted Permission sought to share information with : Case Manager, Family Supports, Other (comment) Permission granted to share information with : Yes, Verbal Permission Granted  Share Information with NAME: [name] and Noreene Larsson  Permission granted to share info w AGENCY: [facility]  Permission granted to share info  w Relationship: daughters  Permission granted to share info w Contact Information: [phone]  Emotional Assessment Appearance:: Appears stated age Attitude/Demeanor/Rapport: Engaged Affect (typically observed): Accepting Orientation: : Oriented to Self, Oriented to Place, Oriented to  Time, Oriented to  Situation Alcohol / Substance Use: Not Applicable Psych Involvement: No (comment)  Admission diagnosis:  Closed fracture of multiple ribs, unspecified laterality, initial encounter [S22.49XA] Sepsis ([place]) [A41.9] Multifocal pneumonia [J18.9] Community acquired pneumonia, unspecified laterality [J18.9] Patient Active Problem List   Diagnosis Date Noted   Multifocal pneumonia 10/10/2021   Pleural effusion on left 10/10/2021   Hypertension    GERD (gastroesophageal reflux disease)    Coronary artery disease    A-fib (HCC)    Atrial fibrillation, chronic (HCC)    Abdominal pain    Hiatal hernia    AAA (abdominal aortic aneurysm)    Left rib fracture    Sepsis ([place])    CKD (chronic kidney disease), stage IV ([place])    Acute cystitis 05/29/2015   Altered mental status 05/27/2015   PCP:  Irish Elders, MD Pharmacy:   [place], [place] - [place] [address] Phone: [phone] Fax: [phone]     Social Determinants of Health (SDOH) Interventions    Readmission Risk Interventions No flowsheet data found.

## 2021-10-14 DIAGNOSIS — N1832 Chronic kidney disease, stage 3b: Secondary | ICD-10-CM

## 2021-10-14 LAB — CBC WITH DIFFERENTIAL/PLATELET
Abs Immature Granulocytes: 0.04 10*3/uL (ref 0.00–0.07)
Basophils Absolute: 0 10*3/uL (ref 0.0–0.1)
Basophils Relative: 0 %
Eosinophils Absolute: 0.2 10*3/uL (ref 0.0–0.5)
Eosinophils Relative: 2 %
HCT: 27.7 % — ABNORMAL LOW (ref 36.0–46.0)
Hemoglobin: 9.7 g/dL — ABNORMAL LOW (ref 12.0–15.0)
Immature Granulocytes: 1 %
Lymphocytes Relative: 8 %
Lymphs Abs: 0.7 10*3/uL (ref 0.7–4.0)
MCH: 35.7 pg — ABNORMAL HIGH (ref 26.0–34.0)
MCHC: 35 g/dL (ref 30.0–36.0)
MCV: 101.8 fL — ABNORMAL HIGH (ref 80.0–100.0)
Monocytes Absolute: 0.8 10*3/uL (ref 0.1–1.0)
Monocytes Relative: 10 %
Neutro Abs: 6.6 10*3/uL (ref 1.7–7.7)
Neutrophils Relative %: 79 %
Platelets: 154 10*3/uL (ref 150–400)
RBC: 2.72 MIL/uL — ABNORMAL LOW (ref 3.87–5.11)
RDW: 13.9 % (ref 11.5–15.5)
WBC: 8.3 10*3/uL (ref 4.0–10.5)
nRBC: 0 % (ref 0.0–0.2)

## 2021-10-14 LAB — COMPREHENSIVE METABOLIC PANEL WITH GFR
ALT: 15 U/L (ref 0–44)
AST: 19 U/L (ref 15–41)
Albumin: 2.9 g/dL — ABNORMAL LOW (ref 3.5–5.0)
Alkaline Phosphatase: 88 U/L (ref 38–126)
Anion gap: 9 (ref 5–15)
BUN: 22 mg/dL (ref 8–23)
CO2: 26 mmol/L (ref 22–32)
Calcium: 8.8 mg/dL — ABNORMAL LOW (ref 8.9–10.3)
Chloride: 104 mmol/L (ref 98–111)
Creatinine, Ser: 1.44 mg/dL — ABNORMAL HIGH (ref 0.44–1.00)
GFR, Estimated: 34 mL/min — ABNORMAL LOW
Glucose, Bld: 111 mg/dL — ABNORMAL HIGH (ref 70–99)
Potassium: 3.8 mmol/L (ref 3.5–5.1)
Sodium: 139 mmol/L (ref 135–145)
Total Bilirubin: 0.7 mg/dL (ref 0.3–1.2)
Total Protein: 5.8 g/dL — ABNORMAL LOW (ref 6.5–8.1)

## 2021-10-14 LAB — MAGNESIUM: Magnesium: 2.1 mg/dL (ref 1.7–2.4)

## 2021-10-14 LAB — PHOSPHORUS: Phosphorus: 3 mg/dL (ref 2.5–4.6)

## 2021-10-14 LAB — HEMOGLOBIN AND HEMATOCRIT, BLOOD
HCT: 27.8 % — ABNORMAL LOW (ref 36.0–46.0)
Hemoglobin: 9.7 g/dL — ABNORMAL LOW (ref 12.0–15.0)

## 2021-10-14 NOTE — Care Management Important Message (Signed)
Important Message  Patient Details  Name: Claire Kelley MRN: [id number] Date of Birth: 1926-08-18   Medicare Important Message Given:  Yes     Juliann Pulse A Hadrian Yarbrough 10/14/2021, 12:08 PM

## 2021-10-14 NOTE — TOC Progression Note (Signed)
Transition of Care [facility]) - Progression Note    Patient Details  Name: Claire Kelley MRN: [id number] Date of Birth: 1926/09/20  [facility]) CM/SW Contact  Shelbie Hutching, RN Phone Number: 10/14/2021, 3:26 PM  Clinical Narrative:    Patient will need home oxygen.  Plan for discharge home with home health services tomorrow.  [facility] accepted referral for home health services and Malachy Mood notified of discharge planned for tomorrow.  [facility] given referral for home oxygen, order and qualifying sat note is in.     Expected Discharge Plan: [place] Barriers to Discharge: Continued Medical Work up  Expected Discharge Plan and Services Expected Discharge Plan: [place]   Discharge Planning Services: CM Consult Post Acute Care Choice: [place] arrangements for the past 2 months: Single Family Home                 DME Arranged: Oxygen DME Agency: [facility] Date DME Agency Contacted: 10/14/21 Time DME Agency Contacted: 1499 Representative spoke with at DME Agency: [place] number HH Arranged: RN, PT, OT HH Agency: [place] Date [place]: 10/14/21 Time [place]: 1400 Representative spoke with at [place]: [place] ([place]) Interventions    Readmission Risk Interventions No flowsheet data found.

## 2021-10-14 NOTE — Progress Notes (Signed)
Pt tolerated SMOG enema with positive result. Pt was able to hold half of dose for approximately 10 minutes with successful BM on bedside commode. Louanne Skye 10/14/21 1:14 AM

## 2021-10-14 NOTE — Progress Notes (Signed)
SATURATION QUALIFICATIONS: (This note is used to comply with regulatory documentation for home oxygen)  Patient Saturations on Room Air at Rest = 87%  Patient Saturations on Room Air while Ambulating = 85%  Patient Saturations on 3 Liters of oxygen while Ambulating = 95%  Please briefly explain why patient needs home oxygen: Patient cannot maintain oxygen saturation levels greater than 88% without supplemental oxygen.

## 2021-10-14 NOTE — Progress Notes (Signed)
PROGRESS NOTE    Claire Kelley  VHQ:[id number] DOB: 25-Feb-1926 DOA: 10/10/2021 PCP: Irish Elders, MD   Brief Narrative:  Claire Kelley is a 87 y.o. WF PMHx HTN, GERD, CKD-4, CAD, CABG, AICD, atrial fibrillation on Coumadin, diverticulosis, dementia, AAA, who presents with cough, shortness breath, nausea, vomiting, abdominal pain, left chest wall pain.  Patient fell on 10/17 at home and injured her left side of the chest, developed a big hematoma in the left flank area.  Patient was seen in ED on 10/08/2021, and found to have 5 rib fractures. Pt continues to have left-sided chest wall pain.   Subjective: 10/27 A/O x4.  Still complains of some constipation.  States with BM is seeing blood, but unable to really describe exactly amount.   Assessment & Plan:  Covid vaccination;  Principal Problem:   Multifocal pneumonia Active Problems:   Hypertension   GERD (gastroesophageal reflux disease)   Coronary artery disease   Atrial fibrillation, chronic (HCC)   Abdominal pain   Hiatal hernia   AAA (abdominal aortic aneurysm)   Left rib fracture   Sepsis (HCC)   Pleural effusion on left   Acute renal failure superimposed on stage 3b chronic kidney disease ([place])   Sepsis due to multifocal pneumonia:  --pt has multifocal pneumonia as shown by CT scan of abdomen/pelvis.  Patient meets criteria for sepsis with WBC 15.0, tachypnea with RR 23.  Pending lactic acid.  Currently hemodynamically stable.  Patient has new oxygen requirement currently on 2 L.  - IV Rocephin and azithromycin - Mucinex for cough  - Bronchodilators -  S. pneumococcal antigen-- negative -- Procalcitonin 0.37  and trend lactic acid level down to 1.1 - patient received IV fluids per sepsis protocol. She is currently afebrile. -- 10/26 convert IV antibiotic to p.o. antibiotic to complete course of medication.  Acute respiratory failure with hypoxia - Patient meets criteria for home O2 SATURATION  QUALIFICATIONS: (This note is used to comply with regulatory documentation for home oxygen)  Patient Saturations on Room Air at Rest = 87%  Patient Saturations on Room Air while Ambulating = 85%  Patient Saturations on 3 Liters of oxygen while Ambulating = 95%  Please briefly explain why patient needs home oxygen: Patient cannot maintain oxygen saturation levels greater than 88% without supplemental oxygen.   -3 L O2 via [place] titrate to maintain SPO2> 92% - Provide [employer] audible O2 portable 02 generator   Left rib fracture: with left rib cage bruise -Control: As needed Percocet, Tylenol, morphine -Incentive spirometry   Hypertension -IV hydralazine as needed -Metoprolol, amlodipine   GERD (gastroesophageal reflux disease) -Protonix   Coronary artery disease:  -S/p of CABG.  No chest pain --Crestor --Hold eliquis due to large hematoma. Per dter has fallen 4 times this year. I have asked daughter [name] to reach out to pt's [facility] cardiologist and discuss eliquis resumption vs discont -- patient not taking aspirin due falls (per card notes) --hgb stable   Abdominal pain, nausea, vomiting:  Etiology is not clear.  May be related to hiatal hernia and multifocal pneumonia. Currently patient does not have active vomiting. -Supportive care, as needed morphine for pain Zofran for nausea -received IV fluid   Hiatal hernia -Protonix   AAA (abdominal aortic aneurysm):  -Patient has told her daughter that she does not want to consider surgical repair of AAA. -f/u as outpt   Acute on CKD stage 3B -Unsure of patient's CKD status as previous BMP 6 years ago, but  given patient's renal improvement not CKD stage IV  Lab Results  Component Value Date   CREATININE 1.39 (H) 10/15/2021   CREATININE 1.44 (H) 10/14/2021   CREATININE 1.50 (H) 10/13/2021   CREATININE 1.69 (H) 10/11/2021   CREATININE 1.82 (H) 10/10/2021  -After fluid resuscitation would classify patient's CKD as CKD stage IIIb    Atrial fibrillation chronic: -Hold Eliquis due to large rib cage bruise.  Restart prior to discharge --Continue metoprolol, amiodarone   Pleural effusion on left -- continue to monitor. Reviewed CT's chest with radiologist. Patient does not have a large pleural effusion. If conservative management does not help with shortness of breath will consider thoracentesis. This was discussed with patient's daughter Claire Kelley - Magnesium goal>2 -10/26 magnesium IV 2 g     DVT prophylaxis: SCD Code Status: Full Family Communication: 10/27, 2 daughters at bedside discussed plan of care all questions answered Status is: Inpatient    Dispo: The patient is from: Home              Anticipated d/c is to: Home              Anticipated d/c date is: 2 days              Patient currently is not medically stable to d/c.      Consultants:    Procedures/Significant Events:     I have personally reviewed and interpreted all radiology studies and my findings are as above.  VENTILATOR SETTINGS:    Cultures   Antimicrobials: Anti-infectives (From admission, onward)    Start     Ordered Stop   10/14/21 1000  azithromycin (ZITHROMAX) tablet 250 mg        10/13/21 2130     10/14/21 1000  cefdinir (OMNICEF) capsule 300 mg        10/13/21 2130     10/11/21 1100  azithromycin (ZITHROMAX) 500 mg in sodium chloride 0.9 % 250 mL IVPB  Status:  Discontinued        10/10/21 1145 10/13/21 2130   10/11/21 1100  cefTRIAXone (ROCEPHIN) 2 g in sodium chloride 0.9 % 100 mL IVPB  Status:  Discontinued        10/10/21 1145 10/13/21 2130   10/10/21 1115  cefTRIAXone (ROCEPHIN) 1 g in sodium chloride 0.9 % 100 mL IVPB        10/10/21 1111 10/10/21 1215   10/10/21 1115  azithromycin (ZITHROMAX) 500 mg in sodium chloride 0.9 % 250 mL IVPB        10/10/21 1111 10/10/21 1329       Devices    LINES / TUBES:      Continuous Infusions:     Objective: Vitals:   10/14/21 1550  10/14/21 1950 10/15/21 0446 10/15/21 0807  BP: (!) 108/55 (!) 125/51 (!) 151/69 129/61  Pulse: 60 81 76 62  Resp: 18 17 16 16   Temp: (!) 97.5 F (36.4 C)  97.9 F (36.6 C) 98 F (36.7 C)  TempSrc: Oral  Oral Oral  SpO2: 98% 95% 93% 96%  Weight:      Height:        Intake/Output Summary (Last 24 hours) at 10/15/2021 0842 Last data filed at 10/15/2021 0700 Gross per 24 hour  Intake 390 ml  Output 500 ml  Net -110 ml   Filed Weights   10/10/21 0908  Weight: 56.3 kg    Examination:  General: A/O x4, positive acute respiratory distress Eyes: negative scleral  hemorrhage, negative anisocoria, negative icterus ENT: Negative Runny nose, negative gingival bleeding, Neck:  Negative scars, masses, torticollis, lymphadenopathy, JVD Lungs: Clear to auscultation bilaterally without wheezes or crackles, AICD present left chest wall Cardiovascular: Irregularly irregular rhythm and rate without murmur gallop or rub normal S1 and S2 Abdomen: negative abdominal pain, nondistended, positive soft, bowel sounds, no rebound, no ascites, no appreciable mass Extremities: No significant cyanosis, clubbing, or edema bilateral lower extremities Skin: Negative rashes, lesions, ulcers Psychiatric:  Negative depression, negative anxiety, negative fatigue, negative mania  Central nervous system:  Cranial nerves II through XII intact, tongue/uvula midline, all extremities muscle strength 5/5, sensation intact throughout, negative dysarthria, negative expressive aphasia, negative receptive aphasia.  .     Data Reviewed: Care during the described time interval was provided by me .  I have reviewed this patient's available data, including medical history, events of note, physical examination, and all test results as part of my evaluation.   CBC: Recent Labs  Lab 10/10/21 0913 10/11/21 0604 10/13/21 0402 10/13/21 0743 10/14/21 0605 10/14/21 1827 10/15/21 0628  WBC 15.0* 10.9*  --  6.9 8.3  --  7.2   NEUTROABS  --   --   --  5.2 6.6  --  5.6  HGB 11.8* 10.0* 9.5* 9.5* 9.7* 9.7* 9.1*  HCT 35.4* 30.6*  --  28.1* 27.7* 27.8* 27.6*  MCV 104.1* 103.4*  --  100.0 101.8*  --  100.7*  PLT 146* 138*  --  141* 154  --  892   Basic Metabolic Panel: Recent Labs  Lab 10/10/21 0913 10/11/21 0604 10/13/21 0743 10/14/21 0605 10/15/21 0628  NA 135 137 137 139 137  K 4.4 4.8 4.6 3.8 4.1  CL 98 106 106 104 104  CO2 26 22 24 26 27   GLUCOSE 155* 81 113* 111* 90  BUN 32* 32* 25* 22 15  CREATININE 1.82* 1.69* 1.50* 1.44* 1.39*  CALCIUM 9.0 8.6* 8.7* 8.8* 8.5*  MG  --   --  1.7 2.1 2.1  PHOS  --   --  3.0 3.0 3.1   GFR: Estimated Creatinine Clearance: 22.5 mL/min (A) (by C-G formula based on SCr of 1.39 mg/dL (H)). Liver Function Tests: Recent Labs  Lab 10/08/21 1229 10/10/21 0913 10/13/21 0743 10/14/21 0605 10/15/21 0628  AST 18 20 16 19 16   ALT 16 13 13 15 14   ALKPHOS 81 81 83 88 85  BILITOT 1.0 1.0 0.6 0.7 0.8  PROT 6.5 6.8 5.7* 5.8* 5.6*  ALBUMIN 3.5 3.5 2.7* 2.9* 2.5*   Recent Labs  Lab 10/10/21 1347  LIPASE 54*   No results for input(s): AMMONIA in the last 168 hours. Coagulation Profile: Recent Labs  Lab 10/08/21 1229  INR 1.3*   Cardiac Enzymes: No results for input(s): CKTOTAL, CKMB, CKMBINDEX, TROPONINI in the last 168 hours. BNP (last 3 results) No results for input(s): PROBNP in the last 8760 hours. HbA1C: No results for input(s): HGBA1C in the last 72 hours. CBG: No results for input(s): GLUCAP in the last 168 hours. Lipid Profile: No results for input(s): CHOL, HDL, LDLCALC, TRIG, CHOLHDL, LDLDIRECT in the last 72 hours. Thyroid Function Tests: No results for input(s): TSH, T4TOTAL, FREET4, T3FREE, THYROIDAB in the last 72 hours. Anemia Panel: No results for input(s): VITAMINB12, FOLATE, FERRITIN, TIBC, IRON, RETICCTPCT in the last 72 hours. Urine analysis:    Component Value Date/Time   COLORURINE YELLOW (A) 10/10/2021 2212   APPEARANCEUR HAZY (A)  10/10/2021 2212   LABSPEC  1.024 10/10/2021 2212   PHURINE 5.0 10/10/2021 2212   GLUCOSEU NEGATIVE 10/10/2021 2212   HGBUR NEGATIVE 10/10/2021 2212   BILIRUBINUR NEGATIVE 10/10/2021 2212   KETONESUR NEGATIVE 10/10/2021 2212   PROTEINUR NEGATIVE 10/10/2021 2212   NITRITE POSITIVE (A) 10/10/2021 2212   LEUKOCYTESUR LARGE (A) 10/10/2021 2212   Sepsis Labs: @LABRCNTIP (procalcitonin:4,lacticidven:4)  ) Recent Results (from the past 240 hour(s))  Resp Panel by RT-PCR (Flu A&B, Covid) Nasopharyngeal Swab     Status: None   Collection Time: 10/10/21  9:29 AM   Specimen: Nasopharyngeal Swab; Nasopharyngeal(NP) swabs in vial transport medium  Result Value Ref Range Status   SARS Coronavirus 2 by RT PCR NEGATIVE NEGATIVE Final    Comment: (NOTE) SARS-CoV-2 target nucleic acids are NOT DETECTED.  The SARS-CoV-2 RNA is generally detectable in upper respiratory specimens during the acute phase of infection. The lowest concentration of SARS-CoV-2 viral copies this assay can detect is 138 copies/mL. A negative result does not preclude SARS-Cov-2 infection and should not be used as the sole basis for treatment or other patient management decisions. A negative result may occur with  improper specimen collection/handling, submission of specimen other than nasopharyngeal swab, presence of viral mutation(s) within the areas targeted by this assay, and inadequate number of viral copies(<138 copies/mL). A negative result must be combined with clinical observations, patient history, and epidemiological information. The expected result is Negative.  Fact Sheet for Patients:  EntrepreneurPulse.com.au  Fact Sheet for Healthcare Providers:  IncredibleEmployment.be  This test is no t yet approved or cleared by the [place] FDA and  has been authorized for detection and/or diagnosis of SARS-CoV-2 by FDA under an Emergency Use Authorization (EUA). This EUA will  remain  in effect (meaning this test can be used) for the duration of the COVID-19 declaration under Section 564(b)(1) of the Act, 21 U.S.C.section 360bbb-3(b)(1), unless the authorization is terminated  or revoked sooner.       Influenza A by PCR NEGATIVE NEGATIVE Final   Influenza B by PCR NEGATIVE NEGATIVE Final    Comment: (NOTE) The Xpert Xpress SARS-CoV-2/FLU/RSV plus assay is intended as an aid in the diagnosis of influenza from Nasopharyngeal swab specimens and should not be used as a sole basis for treatment. Nasal washings and aspirates are unacceptable for Xpert Xpress SARS-CoV-2/FLU/RSV testing.  Fact Sheet for Patients: EntrepreneurPulse.com.au  Fact Sheet for Healthcare Providers: IncredibleEmployment.be  This test is not yet approved or cleared by the [place] FDA and has been authorized for detection and/or diagnosis of SARS-CoV-2 by FDA under an Emergency Use Authorization (EUA). This EUA will remain in effect (meaning this test can be used) for the duration of the COVID-19 declaration under Section 564(b)(1) of the Act, 21 U.S.C. section 360bbb-3(b)(1), unless the authorization is terminated or revoked.  Performed at [facility], [address] [place], [place] [address]   Blood culture (routine x 2)     Status: None   Collection Time: 10/10/21 11:31 AM   Specimen: BLOOD  Result Value Ref Range Status   Specimen Description BLOOD LEFT [facility]  Final   Special Requests   Final    BOTTLES DRAWN AEROBIC AND ANAEROBIC Blood Culture adequate volume   Culture   Final    NO GROWTH 5 DAYS Performed at [facility], [address], [place], [place] [address]    Report Status 10/15/2021 FINAL  Final  Blood culture (routine x 2)     Status: None   Collection Time: 10/10/21  11:31 AM   Specimen: BLOOD  Result Value Ref Range Status   Specimen Description BLOOD RIGHT [facility]  Final   Special Requests   Final     BOTTLES DRAWN AEROBIC AND ANAEROBIC Blood Culture adequate volume   Culture   Final    NO GROWTH 5 DAYS Performed at [facility], [address] [place], [place] [address]    Report Status 10/15/2021 FINAL  Final         Radiology Studies: No results found.      Scheduled Meds:  amiodarone  100 mg Oral Daily   azithromycin  250 mg Oral Daily   cefdinir  300 mg Oral Daily   cholecalciferol  1,000 Units Oral Daily   donepezil  5 mg Oral Daily   ferrous sulfate   Oral Daily   metoprolol succinate  12.5 mg Oral Daily   multivitamin with minerals  1 tablet Oral Daily   oxybutynin  5 mg Oral QHS   pantoprazole  40 mg Oral Daily   rosuvastatin  5 mg Oral Daily   vitamin B-12  100 mcg Oral Daily   Continuous Infusions:     LOS: 5 days   The patient is critically ill with multiple organ systems failure and requires high complexity decision making for assessment and support, frequent evaluation and titration of therapies, application of advanced monitoring technologies and extensive interpretation of multiple databases. Critical Care Time devoted to patient care services described in this note  Time spent: 40 minutes     Antoninette Lerner, Geraldo Docker, MD [facility]   If 7PM-7AM, please contact night-coverage 10/15/2021, 8:42 AM

## 2021-10-14 NOTE — Progress Notes (Signed)
Physical Therapy Treatment Patient Details Name: Claire Kelley MRN: [id number] DOB: 1926-04-12 Today's Date: 10/14/2021   History of Present Illness Pt is a 87 y.o. female presenting to hospital 10/23 with worsening pain in L chest and abdomen (pt fell about 6 days prior and had presented to ED where she was found to have 5 rib fx's but has had a difficult time managing at home since discharge home); now with nausea and frequent vomiting.  Pt admitted with sepsis d/t multifocal PNA, L rib fx's, L pleural effusion, abdominal pain, and nausea/vomiting.  PMH includes a-fib, CAD, gastric reflux, htn, HLD, AICD, abdominal aortic aneurysm repair, CABG.    PT Comments    Pt continuing to make progress with her mobility and endurance. Pt ambulated in room with Rw + supervision x 2 laps with reports of fatigue but no SOB throughout. Oxygen saturation stats were monitored throughout (see below). Pt will continue to benefit from skilled PT services at discharge to improve endurance and balance to reduce fall risk. Pt left seated in recliner chair on 3L of oxygen at >92% SpO2.  Oxygen saturation Sitting: Room Air 87%  Ambulation: Room Air 85%, 1L 86%, 2L 87%, 3L 95%   Recommendations for follow up therapy are one component of a multi-disciplinary discharge planning process, led by the attending physician.  Recommendations may be updated based on patient status, additional functional criteria and insurance authorization.  Follow Up Recommendations  Home health PT     Assistance Recommended at Discharge Set up Supervision/Assistance  Equipment Recommendations  Rolling walker (2 wheels);3in1 (PT)    Recommendations for Other Services       Precautions / Restrictions Precautions Precautions: Fall Restrictions Weight Bearing Restrictions: No     Mobility  Bed Mobility Overal bed mobility: Needs Assistance Bed Mobility: Supine to Sit     Supine to sit: Modified independent (Device/Increase  time);HOB elevated     General bed mobility comments: Pt able to scoot towards EOB without cues or assistance    Transfers Overall transfer level: Needs assistance Equipment used: Rolling walker (2 wheels) Transfers: Sit to/from Stand Sit to Stand: Min guard           General transfer comment: cues for increased forwards lean to improve sit to stand    Ambulation/Gait Ambulation/Gait assistance: Supervision Gait Distance (Feet): 75 Feet Assistive device: Rolling walker (2 wheels) Gait Pattern/deviations: Step-through pattern;Decreased stride length;Trunk flexed Gait velocity: decreased   General Gait Details: Ambulated from bed to door way and back x 2 with reports of fatigue. Pt demonstrating increased forward flexion throughout. Pt denies any SOB during ambulation   Stairs             Wheelchair Mobility    Modified Rankin (Stroke Patients Only)       Balance Overall balance assessment: Needs assistance Sitting-balance support: No upper extremity supported;Feet supported Sitting balance-Leahy Scale: Good     Standing balance support: No upper extremity supported;During functional activity Standing balance-Leahy Scale: Fair Standing balance comment: Fair balance without UE support in static standing. Able to take a few steps without B UE assistance with CGA for safety.                            Cognition Arousal/Alertness: Awake/alert Behavior During Therapy: WFL for tasks assessed/performed Overall Cognitive Status: Within Functional Limits for tasks assessed  Exercises      General Comments        Pertinent Vitals/Pain Pain Assessment: Faces Faces Pain Scale: Hurts little more Pain Location: abdomen Pain Descriptors / Indicators: Tender;Aching;Discomfort;Constant Pain Intervention(s): Limited activity within patient's tolerance;Monitored during session    Home Living                           Prior Function            PT Goals (current goals can now be found in the care plan section) Acute Rehab PT Goals Patient Stated Goal: to improve pain and mobility PT Goal Formulation: With patient Time For Goal Achievement: 10/26/21 Potential to Achieve Goals: Good Progress towards PT goals: Progressing toward goals    Frequency    Min 2X/week      PT Plan Current plan remains appropriate    Co-evaluation              AM-PAC PT "6 Clicks" Mobility   Outcome Measure  Help needed turning from your back to your side while in a flat bed without using bedrails?: None Help needed moving from lying on your back to sitting on the side of a flat bed without using bedrails?: None Help needed moving to and from a bed to a chair (including a wheelchair)?: A Little Help needed standing up from a chair using your arms (e.g., wheelchair or bedside chair)?: A Little Help needed to walk in hospital room?: A Little Help needed climbing 3-5 steps with a railing? : A Lot 6 Click Score: 19    End of Session Equipment Utilized During Treatment: Gait belt;Oxygen Activity Tolerance: Patient tolerated treatment well Patient left: in chair;with call bell/phone within reach;with chair alarm set Nurse Communication: Mobility status PT Visit Diagnosis: Muscle weakness (generalized) (M62.81);History of falling (Z91.81);Other abnormalities of gait and mobility (R26.89);Pain     Time: 3329-5188 PT Time Calculation (min) (ACUTE ONLY): 19 min  Charges:  $Gait Training: 8-22 mins                     Andrey Campanile, SPT    Andrey Campanile 10/14/2021, 12:55 PM

## 2021-10-15 DIAGNOSIS — N179 Acute kidney failure, unspecified: Secondary | ICD-10-CM | POA: Diagnosis present

## 2021-10-15 DIAGNOSIS — A4150 Gram-negative sepsis, unspecified: Secondary | ICD-10-CM

## 2021-10-15 LAB — CULTURE, BLOOD (ROUTINE X 2)
Culture: NO GROWTH
Culture: NO GROWTH
Special Requests: ADEQUATE
Special Requests: ADEQUATE

## 2021-10-15 LAB — CBC WITH DIFFERENTIAL/PLATELET
Abs Immature Granulocytes: 0.03 10*3/uL (ref 0.00–0.07)
Basophils Absolute: 0 10*3/uL (ref 0.0–0.1)
Basophils Relative: 0 %
Eosinophils Absolute: 0.2 10*3/uL (ref 0.0–0.5)
Eosinophils Relative: 3 %
HCT: 27.6 % — ABNORMAL LOW (ref 36.0–46.0)
Hemoglobin: 9.1 g/dL — ABNORMAL LOW (ref 12.0–15.0)
Immature Granulocytes: 0 %
Lymphocytes Relative: 9 %
Lymphs Abs: 0.6 10*3/uL — ABNORMAL LOW (ref 0.7–4.0)
MCH: 33.2 pg (ref 26.0–34.0)
MCHC: 33 g/dL (ref 30.0–36.0)
MCV: 100.7 fL — ABNORMAL HIGH (ref 80.0–100.0)
Monocytes Absolute: 0.7 10*3/uL (ref 0.1–1.0)
Monocytes Relative: 10 %
Neutro Abs: 5.6 10*3/uL (ref 1.7–7.7)
Neutrophils Relative %: 78 %
Platelets: 154 10*3/uL (ref 150–400)
RBC: 2.74 MIL/uL — ABNORMAL LOW (ref 3.87–5.11)
RDW: 14 % (ref 11.5–15.5)
WBC: 7.2 10*3/uL (ref 4.0–10.5)
nRBC: 0 % (ref 0.0–0.2)

## 2021-10-15 LAB — COMPREHENSIVE METABOLIC PANEL
ALT: 14 U/L (ref 0–44)
AST: 16 U/L (ref 15–41)
Albumin: 2.5 g/dL — ABNORMAL LOW (ref 3.5–5.0)
Alkaline Phosphatase: 85 U/L (ref 38–126)
Anion gap: 6 (ref 5–15)
BUN: 15 mg/dL (ref 8–23)
CO2: 27 mmol/L (ref 22–32)
Calcium: 8.5 mg/dL — ABNORMAL LOW (ref 8.9–10.3)
Chloride: 104 mmol/L (ref 98–111)
Creatinine, Ser: 1.39 mg/dL — ABNORMAL HIGH (ref 0.44–1.00)
GFR, Estimated: 35 mL/min — ABNORMAL LOW (ref >60)
Glucose, Bld: 90 mg/dL (ref 70–99)
Potassium: 4.1 mmol/L (ref 3.5–5.1)
Sodium: 137 mmol/L (ref 135–145)
Total Bilirubin: 0.8 mg/dL (ref 0.3–1.2)
Total Protein: 5.6 g/dL — ABNORMAL LOW (ref 6.5–8.1)

## 2021-10-15 LAB — MAGNESIUM: Magnesium: 2.1 mg/dL (ref 1.7–2.4)

## 2021-10-15 LAB — PHOSPHORUS: Phosphorus: 3.1 mg/dL (ref 2.5–4.6)

## 2021-10-15 MED ORDER — TRAMADOL HCL 50 MG PO TABS: 50.0000 mg | ORAL_TABLET | Freq: Four times a day (QID) | ORAL | Status: DC | PRN

## 2021-10-15 MED ORDER — METOPROLOL SUCCINATE ER 25 MG PO TB24: 12.5000 mg | ORAL_TABLET | Freq: Every day | ORAL | 0 refills | Status: DC

## 2021-10-15 MED ORDER — TRAMADOL HCL 50 MG PO TABS: 50.0000 mg | ORAL_TABLET | Freq: Four times a day (QID) | ORAL | 0 refills | Status: DC | PRN

## 2021-10-15 MED ORDER — VITAMIN D3 25 MCG PO TABS: 1000.0000 [IU] | ORAL_TABLET | Freq: Every day | ORAL | 0 refills | Status: DC

## 2021-10-15 NOTE — TOC Progression Note (Signed)
Transition of Care [facility]) - Progression Note    Patient Details  Name: Claire Kelley MRN: [id number] Date of Birth: [birth year]  Transition of Care [facility]) CM/SW [place], RN Phone Number: 10/15/2021, 12:16 PM  Clinical Narrative:  To discharge home today with daughter [name]. [facility] will provide RN, PT and OT services. Family arranged sitter services with [facility], H & O faxed to [place], [phone]. Oxygen to be serviced by [facility].TOC barriers resolved.     Expected Discharge Plan: [place] Barriers to Discharge: Barriers Resolved  Expected Discharge Plan and Services Expected Discharge Plan: [place]   Discharge Planning Services: CM Consult Post Acute Care Choice: [place] arrangements for the past 2 months: Single Family Home                 DME Arranged: Oxygen DME Agency: [facility] Date DME Agency Contacted: 10/14/21 Time DME Agency Contacted: 2800 Representative spoke with at DME Agency: [place] number HH Arranged: RN, PT, OT HH Agency: [place] Date [place]: 10/14/21 Time [place]: 1200 Representative spoke with at [place]: [place] ([place]) Interventions    Readmission Risk Interventions No flowsheet data found.

## 2021-10-15 NOTE — Discharge Summary (Signed)
Physician Discharge Summary  Claire Kelley TOI:[id number] DOB: July 06, 1926 DOA: 10/10/2021  PCP: Claire Elders, MD  Admit date: 10/10/2021 Discharge date: 10/16/2021  Time spent: 35 minutes  Recommendations for Outpatient Follow-up:   Sepsis due to multifocal pneumonia:  --pt has multifocal pneumonia as shown by CT scan of abdomen/pelvis.  Patient meets criteria for sepsis with WBC 15.0, tachypnea with RR 23.  Pending lactic acid.  Currently hemodynamically stable.  Patient has new oxygen requirement currently on 2 L.  - patient received IV fluids per sepsis protocol. She is currently afebrile. -- 10/26 convert IV antibiotic to p.o. antibiotic to complete course of medication.   Acute respiratory failure with hypoxia - Patient meets criteria for home O2 SATURATION QUALIFICATIONS: (This note is used to comply with regulatory documentation for home oxygen)  Patient Saturations on Room Air at Rest = 87%  Patient Saturations on Room Air while Ambulating = 85%  Patient Saturations on 3 Liters of oxygen while Ambulating = 95%  Please briefly explain why patient needs home oxygen: Patient cannot maintain oxygen saturation levels greater than 88% without supplemental oxygen.   -3 L O2 via [place] titrate to maintain SPO2> 92% - Provide [employer] O2 portable generator   Left rib fracture: with left rib cage bruise -Control: As needed tramadol, Tylenol,  -Incentive spirometry -Follow-up with PCP in 2 to 3 weeks   Hypertension -IV hydralazine as needed -Metoprolol, amlodipine   GERD (gastroesophageal reflux disease) -Protonix   Coronary artery disease:  -S/p of CABG.  No chest pain --Crestor --Hold eliquis due to large hematoma. Per dter has fallen 4 times this year. I have asked daughter Claire Kelley to reach out to pt's [facility] cardiologist and discuss eliquis resumption vs discont -- patient not taking aspirin due falls (per card notes)  Lab Results  Component Value Date   HGB 9.1 (L)  10/15/2021   HGB 9.7 (L) 10/14/2021   HGB 9.7 (L) 10/14/2021   HGB 9.5 (L) 10/13/2021   HGB 9.5 (L) 10/13/2021  --hgb stable   Abdominal pain, nausea, vomiting:  Etiology is not clear.  May be related to hiatal hernia and multifocal pneumonia. Currently patient does not have active vomiting. -Supportive care, as needed morphine for pain Zofran for nausea -received IV fluid -Resolved   Hiatal hernia -Protonix -Discussed warning signs with patient and daughters which would require patient to see surgeon for surgical repair.  Currently signs and symptoms not present   AAA (abdominal aortic aneurysm):  -Patient has told her daughter that she does not want to consider surgical repair of AAA. -f/u as outpt   Acute on CKD stage 3B -Unsure of patient's CKD status as previous BMP 6 years ago, but given patient's renal improvement not CKD stage IV Lab Results  Component Value Date   CREATININE 1.39 (H) 10/15/2021   CREATININE 1.44 (H) 10/14/2021   CREATININE 1.50 (H) 10/13/2021   CREATININE 1.69 (H) 10/11/2021   CREATININE 1.82 (H) 10/10/2021  -After fluid resuscitation would classify patient's CKD as CKD stage IIIb   Atrial fibrillation chronic: -Hold Eliquis due to large rib cage bruise.  -Daughters to discuss with cardiologist appropriate time to restart Eliquis given patient's fractured ribs and hematoma --Continue metoprolol, amiodarone   Pleural effusion on left -- continue to monitor. Reviewed CT's chest with radiologist. Patient does not have a large pleural effusion. If conservative management does not help with shortness of breath will consider thoracentesis. This was discussed with patient's daughter Claire Kelley -  Magnesium goal>2 -10/26 magnesium IV 2 g      Discharge Diagnoses:  Principal Problem:   Multifocal pneumonia Active Problems:   Hypertension   GERD (gastroesophageal reflux disease)   Coronary artery disease   Atrial fibrillation, chronic  (HCC)   Abdominal pain   Hiatal hernia   AAA (abdominal aortic aneurysm)   Left rib fracture   Sepsis ([place])   Pleural effusion on left   Acute renal failure superimposed on stage 3b chronic kidney disease ([place])   Discharge Condition: Stable  Diet recommendation:   Filed Weights   10/10/21 0908  Weight: 56.3 kg    History of present illness:  Claire Kelley is a 87 y.o. WF PMHx HTN, GERD, CKD-4, CAD, CABG, AICD, atrial fibrillation on Coumadin, diverticulosis, dementia, AAA, who presents with cough, shortness breath, nausea, vomiting, abdominal pain, left chest wall pain.  Patient fell on 10/17 at home and injured her left side of the chest, developed a big hematoma in the left flank area.  Patient was seen in ED on 10/08/2021, and found to have 5 rib fractures. Pt continues to have left-sided chest wall pain.   Hospital Course:  See above   Cultures  10/23 SARS coronavirus negative 10/23 influenza A/B negative   Antibiotics Anti-infectives (From admission, onward)    Start     Ordered Stop   10/14/21 1000  azithromycin (ZITHROMAX) tablet 250 mg  Status:  Discontinued        10/13/21 2130 10/15/21 2318   10/14/21 1000  cefdinir (OMNICEF) capsule 300 mg  Status:  Discontinued        10/13/21 2130 10/15/21 2318   10/11/21 1100  azithromycin (ZITHROMAX) 500 mg in sodium chloride 0.9 % 250 mL IVPB  Status:  Discontinued        10/10/21 1145 10/13/21 2130   10/11/21 1100  cefTRIAXone (ROCEPHIN) 2 g in sodium chloride 0.9 % 100 mL IVPB  Status:  Discontinued        10/10/21 1145 10/13/21 2130   10/10/21 1115  cefTRIAXone (ROCEPHIN) 1 g in sodium chloride 0.9 % 100 mL IVPB        10/10/21 1111 10/10/21 1215   10/10/21 1115  azithromycin (ZITHROMAX) 500 mg in sodium chloride 0.9 % 250 mL IVPB        10/10/21 1111 10/10/21 1329         Discharge Exam: Vitals:   10/15/21 0446 10/15/21 0807 10/15/21 1100 10/15/21 1126  BP: (!) 151/69 129/61 115/63 115/63  Pulse: 76 62 69  69  Resp: 16 16 18 18   Temp: 97.9 F (36.6 C) 98 F (36.7 C) 97.8 F (36.6 C) 97.8 F (36.6 C)  TempSrc: Oral Oral Oral Oral  SpO2: 93% 96% 100% 100%  Weight:      Height:        General: A/O x4, positive acute respiratory distress Eyes: negative scleral hemorrhage, negative anisocoria, negative icterus ENT: Negative Runny nose, negative gingival bleeding, Neck:  Negative scars, masses, torticollis, lymphadenopathy, JVD Lungs: Clear to auscultation bilaterally without wheezes or crackles, AICD present left chest wall Cardiovascular: Irregularly irregular rhythm and rate without murmur gallop or rub normal S1 and S2 Abdomen: negative abdominal pain, nondistended, positive soft, bowel sounds, no rebound, no ascites, no appreciable mass   Discharge Instructions   Allergies as of 10/15/2021       Reactions   Bactrim [sulfamethoxazole-trimethoprim] Other (See Comments)   Reaction:  Unknown    Ciprocinonide [fluocinolone] Other (See Comments)  Reaction:  Unknown    Ciprofloxacin Other (See Comments)   Codeine    Lipitor [atorvastatin] Other (See Comments)   Reaction:  Leg cramps    Macrobid [nitrofurantoin Monohyd Macro] Other (See Comments)   Reaction:  Unknown    Paxil [paroxetine Hcl] Other (See Comments)   Reaction:  Made pt "loopy"        Medication List     STOP taking these medications    amLODipine 5 MG tablet Commonly known as: NORVASC   apixaban 2.5 MG Tabs tablet Commonly known as: ELIQUIS   aspirin EC 81 MG tablet   Cholecalciferol 25 MCG (1000 UT) capsule Replaced by: Vitamin D3 25 MCG tablet You also have another medication with the same name that you need to continue taking as instructed.   clotrimazole-betamethasone cream Commonly known as: LOTRISONE   diphenhydrAMINE 25 MG tablet Commonly known as: SOMINEX   HYDROcodone-acetaminophen 5-325 MG tablet Commonly known as: NORCO/VICODIN   senna 8.6 MG tablet Commonly known as: SENOKOT        TAKE these medications    acetaminophen 650 MG CR tablet Commonly known as: TYLENOL Take 1,300 mg by mouth every 8 (eight) hours as needed for pain. What changed: Another medication with the same name was removed. Continue taking this medication, and follow the directions you see here.   amiodarone 200 MG tablet Commonly known as: PACERONE Take 0.5 tablets by mouth daily.   Biotin 1000 MCG Chew Chew 500 mg by mouth daily.   clobetasol cream 0.05 % Commonly known as: TEMOVATE Apply 1 application topically daily as needed.   Cranberry 500 MG Caps Take 1 capsule by mouth daily.   CRANBERRY-VITAMIN C PO Take 1 capsule by mouth 2 (two) times daily.   Cranberry-Vitamin C-Vitamin E 140-100-3 MG-MG-UNIT Caps Take by mouth.   donepezil 5 MG tablet Commonly known as: ARICEPT Take by mouth.   Dulcolax 5 MG EC tablet Generic drug: bisacodyl Take 1 tablet (5 mg total) by mouth daily as needed for moderate constipation.   Iron-Vitamin C 100-250 MG Tabs Take by mouth.   magnesium gluconate 500 MG tablet Commonly known as: MAGONATE Take 250 mg by mouth 2 (two) times daily.   melatonin 1 MG Tabs tablet Take 1 tablet by mouth at bedtime as needed.   metoprolol succinate 25 MG 24 hr tablet Commonly known as: TOPROL-XL Take 0.5 tablets (12.5 mg total) by mouth daily. What changed:  how much to take when to take this Another medication with the same name was removed. Continue taking this medication, and follow the directions you see here.   oxybutynin 5 MG 24 hr tablet Commonly known as: DITROPAN-XL Take 1 tablet by mouth at bedtime.   pantoprazole 40 MG tablet Commonly known as: PROTONIX Take 40 mg by mouth daily.   prochlorperazine 5 MG tablet Commonly known as: COMPAZINE Take 1 tablet by mouth daily as needed.   rosuvastatin 5 MG tablet Commonly known as: CRESTOR Take 5 mg by mouth daily.   senna-docusate 8.6-50 MG tablet Commonly known as: Senokot-S Take  1 tablet by mouth as needed.   traMADol 50 MG tablet Commonly known as: ULTRAM Take 1 tablet (50 mg total) by mouth every 6 (six) hours as needed for moderate pain (May increase to 2 tablets q 6 hrs PRN).   vitamin B-12 100 MCG tablet Commonly known as: CYANOCOBALAMIN Take 100 mcg by mouth daily.   Vitamin D3 25 MCG (1000 UT) Caps Take 1 capsule by  mouth daily. What changed:  Another medication with the same name was added. Make sure you understand how and when to take each. Another medication with the same name was removed. Continue taking this medication, and follow the directions you see here.   Vitamin D3 25 MCG tablet Commonly known as: Vitamin D Take 1 tablet (1,000 Units total) by mouth daily. What changed: You were already taking a medication with the same name, and this prescription was added. Make sure you understand how and when to take each. Replaces: Cholecalciferol 25 MCG (1000 UT) capsule       Allergies  Allergen Reactions   Bactrim [Sulfamethoxazole-Trimethoprim] Other (See Comments)    Reaction:  Unknown    Ciprocinonide [Fluocinolone] Other (See Comments)    Reaction:  Unknown    Ciprofloxacin Other (See Comments)   Codeine    Lipitor [Atorvastatin] Other (See Comments)    Reaction:  Leg cramps    Macrobid [Nitrofurantoin Monohyd Macro] Other (See Comments)    Reaction:  Unknown    Paxil [Paroxetine Hcl] Other (See Comments)    Reaction:  Made pt "loopy"      The results of significant diagnostics from this hospitalization (including imaging, microbiology, ancillary and laboratory) are listed below for reference.    Significant Diagnostic Studies: CT ABDOMEN PELVIS WO CONTRAST  Result Date: 10/10/2021 CLINICAL DATA:  Abdominal pain EXAM: CT ABDOMEN AND PELVIS WITHOUT CONTRAST TECHNIQUE: Multidetector CT imaging of the abdomen and pelvis was performed following the standard protocol without IV contrast. COMPARISON:  10/08/2021 FINDINGS: Lower chest:  There is a left pleural effusion which appears unchanged from previous exam. Progressive airspace consolidation within the left lower lobe is identified, image 5/4. New areas of ground-glass attenuation, thickening of the peribronchovascular interstitium and atelectasis identified within the right middle lobe and right lower lobe. There is cardiac enlargement with aortic atherosclerosis. Signs of previous CABG procedure. Hepatobiliary: No focal liver abnormality is seen. No gallstones, gallbladder wall thickening, or biliary dilatation. Pancreas: Unremarkable. No pancreatic ductal dilatation or surrounding inflammatory changes. Spleen: Normal in size without focal abnormality. Adrenals/Urinary Tract: Normal adrenal glands. Bilateral kidney cysts. The largest cyst arises off the lateral cortex of the interpolar left kidney measuring 5.3 cm, image 38/2 bilateral renal calcifications are suspected to be vascular in etiology. Stone within the inferior pole collecting system of the right kidney measures 4 mm, image 52/5. No hydronephrosis identified bilaterally. Urinary bladder is unremarkable. Stomach/Bowel: There is a large hiatal hernia with approximately 50% intrathoracic stomach. Diffuse distension of the intrathoracic and intra-stomach is identified. The appendix is not confidently identified. Moderate stool burden noted within the colon. No bowel wall thickening, inflammation or distension. Sigmoid diverticulosis without signs of acute diverticulitis. Vascular/Lymphatic: Aortic atherosclerosis. Large infrarenal abdominal aortic aneurysm is again noted. Status post stent graft repair. The aneurysm sac measures 8.5 by 6.4 cm, image 41/2. Unchanged from recent exam. No signs of abdominopelvic adenopathy. Reproductive: Uterus and bilateral adnexa are unremarkable. Other: Left inguinal hernia contains a nonobstructed loop of small bowel, image 81/2. no free fluid or fluid collection. Musculoskeletal: Remote healed  right superior and inferior pubic rami fractures. Thoracolumbar scoliosis deformity is identified. Multiple subacute fractures are again seen involving the eighth, ninth, tenth, eleventh, and twelfth ribs. Stable appearance of mild, chronic endplate deformity involving the L2 vertebral body. IMPRESSION: 1. No acute findings identified within the abdomen or pelvis. 2. There is progressive airspace disease within the left lower lobe with new postinflammatory changes noted in the right  middle lobe and right lower lobe. Imaging findings are concerning for multifocal pneumonia. 3. Large hiatal hernia with approximately 50% intrathoracic stomach. Diffuse distension of the intrathoracic and intra-stomach is identified. 4. Similar appearance of large infrarenal abdominal aortic aneurysm status post stent graft repair. The aneurysm sac measures 8.5 x 6.4 cm. As mention previously endoleak cannot be excluded on unenhanced study. Correlation with previous imaging at an outside location or contrast enhanced exam is advised. 5. Left inguinal hernia contains a nonobstructed loop of small bowel. 6. Multiple subacute left rib fractures are again seen. 7. Nonobstructing right renal calculus. 8. Aortic Atherosclerosis (ICD10-I70.0). Electronically Signed   By: Kerby Moors M.D.   On: 10/10/2021 10:13   DG Ribs Unilateral W/Chest Left  Result Date: 10/08/2021 CLINICAL DATA:  Fall.  Left chest pain EXAM: LEFT RIBS AND CHEST - 3+ VIEW COMPARISON:  09/23/2018 FINDINGS: CABG changes. Dual lead pacemaker in satisfactory position. Negative for heart failure. Multiple left rib fractures. . Mild to moderate left pleural effusion and left lower lobe atelectasis. No pneumothorax. Mild right lower lobe atelectasis. Fractures of the left seventh, eighth, and ninth ribs with displacement. There is moderate displacement of the left eighth rib fracture. Atherosclerotic aorta.  Aorta iliac stent graft. IMPRESSION: Fractures left seventh,  eighth, and ninth ribs. Left pleural effusion and left lower lobe atelectasis. No pneumothorax. Electronically Signed   By: Franchot Gallo M.D.   On: 10/08/2021 11:00   CT T-SPINE NO CHARGE  Result Date: 10/08/2021 CLINICAL DATA:  Trauma.  Fall with multiple rib fractures. EXAM: CT THORACIC AND LUMBAR SPINE WITHOUT CONTRAST TECHNIQUE: Multiplanar CT images of the thoracic and lumbar spine were reconstructed from contemporary CT of the Chest, Abdomen, and Pelvis CONTRAST:  None COMPARISON:  CT chest, abdomen, and pelvis 07/25/2008 FINDINGS: CT THORACIC SPINE FINDINGS Alignment: Mild S-shaped thoracic scoliosis. No significant listhesis. Vertebrae: Incomplete imaging of T1. No acute thoracic spine fracture or suspicious osseous lesion. Paraspinal and other soft tissues: No acute abnormality identified in the paraspinal soft tissues. Intrathoracic contents reported separately. Disc levels: Asymmetrically advanced disc degeneration on the right at T11-12. Mild-to-moderate multilevel thoracic facet arthrosis. No evidence of high-grade spinal canal stenosis. CT LUMBAR SPINE FINDINGS Segmentation: 5 lumbar type vertebrae. Alignment: Severe lumbar dextroscoliosis. No significant listhesis in the sagittal plane. Vertebrae: No acute fracture or suspicious osseous lesion. Chronic appearing Schmorl's nodes involving the L2 and L5 superior endplates. Paraspinal and other soft tissues: No acute abnormality identified in the paraspinal soft tissues. Intra-abdominal and pelvic contents reported separately. Disc levels: Widespread advanced lumbar disc degeneration and moderately advanced facet arthrosis. Mild-to-moderate spinal stenosis at L4-5 greater than L3-4 due to disc bulging and posterior element hypertrophy. Moderate left neural foraminal stenosis at L3-4 and L4-5. IMPRESSION: 1. No acute fracture identified in the thoracic or lumbar spine. 2. Severe lumbar dextroscoliosis with advanced disc and facet degeneration.  Electronically Signed   By: Logan Bores M.D.   On: 10/08/2021 14:55   CT L-SPINE NO CHARGE  Result Date: 10/08/2021 CLINICAL DATA:  Trauma.  Fall with multiple rib fractures. EXAM: CT THORACIC AND LUMBAR SPINE WITHOUT CONTRAST TECHNIQUE: Multiplanar CT images of the thoracic and lumbar spine were reconstructed from contemporary CT of the Chest, Abdomen, and Pelvis CONTRAST:  None COMPARISON:  CT chest, abdomen, and pelvis 07/25/2008 FINDINGS: CT THORACIC SPINE FINDINGS Alignment: Mild S-shaped thoracic scoliosis. No significant listhesis. Vertebrae: Incomplete imaging of T1. No acute thoracic spine fracture or suspicious osseous lesion. Paraspinal and other soft tissues:  No acute abnormality identified in the paraspinal soft tissues. Intrathoracic contents reported separately. Disc levels: Asymmetrically advanced disc degeneration on the right at T11-12. Mild-to-moderate multilevel thoracic facet arthrosis. No evidence of high-grade spinal canal stenosis. CT LUMBAR SPINE FINDINGS Segmentation: 5 lumbar type vertebrae. Alignment: Severe lumbar dextroscoliosis. No significant listhesis in the sagittal plane. Vertebrae: No acute fracture or suspicious osseous lesion. Chronic appearing Schmorl's nodes involving the L2 and L5 superior endplates. Paraspinal and other soft tissues: No acute abnormality identified in the paraspinal soft tissues. Intra-abdominal and pelvic contents reported separately. Disc levels: Widespread advanced lumbar disc degeneration and moderately advanced facet arthrosis. Mild-to-moderate spinal stenosis at L4-5 greater than L3-4 due to disc bulging and posterior element hypertrophy. Moderate left neural foraminal stenosis at L3-4 and L4-5. IMPRESSION: 1. No acute fracture identified in the thoracic or lumbar spine. 2. Severe lumbar dextroscoliosis with advanced disc and facet degeneration. Electronically Signed   By: Logan Bores M.D.   On: 10/08/2021 14:55   DG Chest Portable 1  View  Result Date: 10/10/2021 CLINICAL DATA:  87 year old female with chest pain and shortness of breath. Multiple recent left rib fractures. EXAM: PORTABLE CHEST 1 VIEW COMPARISON:  CT Chest, Abdomen, and Pelvis 10/08/2021 and earlier. FINDINGS: Portable AP semi upright view at 1121 hours. No pneumothorax identified. Left pleural effusion has mildly increased, now small to moderate. No retrocardiac air bronchograms. No pulmonary edema. Patchy opacity now also at the right lung base. Stable cardiac size and mediastinal contours. Prior CABG. Left chest cardiac pacemaker. Visualized tracheal air column is within normal limits. Rib fractures better demonstrated by CT. Calcified aortic atherosclerosis. IMPRESSION: 1. No pneumothorax, but the left pleural effusion following rib fractures has increased since 10/08/2021, now small to moderate. 2. New patchy right lung base opacity, favor atelectasis. Electronically Signed   By: Genevie Ann M.D.   On: 10/10/2021 11:31   CT CHEST ABDOMEN PELVIS WO CONTRAST  Result Date: 10/08/2021 CLINICAL DATA:  Fall with rib fractures. EXAM: CT CHEST, ABDOMEN AND PELVIS WITHOUT CONTRAST TECHNIQUE: Multidetector CT imaging of the chest, abdomen and pelvis was performed following the standard protocol without IV contrast. COMPARISON:  Radiography earlier same day. FINDINGS: CT CHEST FINDINGS Cardiovascular: Previous median sternotomy. Cardiomegaly. Pacemaker in place. Aortic atherosclerosis. Coronary artery calcification. Mediastinum/Nodes: No mediastinal or hilar mass or lymphadenopathy. Lungs/Pleura: Left pleural effusion layering dependently with atelectasis at the left lung base. No pneumothorax. Chronic bronchiectasis and volume loss in the right middle lobe. Musculoskeletal: Fractures of the posterior tenth and eleventh ribs. Fracture of the posterolateral seventh, eighth and ninth ribs. No evidence of thoracic spinal fracture. CT ABDOMEN PELVIS FINDINGS Hepatobiliary: No acute or  traumatic liver finding. No calcified gallstones. Pancreas: Normal Spleen: Normal Adrenals/Urinary Tract: Adrenal glands are normal. Bilateral renal cysts, the largest on the left measuring 5.2 cm in diameter. No evidence of renal injury or hydronephrosis. Few punctate renal calcifications that could be vascular or tiny nonobstructing stones. Bladder is normal. Stomach/Bowel: Stomach and small intestine are unremarkable. No acute colon finding. Vascular/Lymphatic: Aortoiliac stent graft. Aneurysm sac measuring 6.8 x 8.5 cm. No previous imaging of this aneurysm. Consider correlation with previous studies or contrast enhanced examination to assess for possible endoleak. No sign of retroperitoneal bleeding presently. No adenopathy. Reproductive: No pelvic mass. Other: No free fluid or air. Musculoskeletal: Scoliosis and degenerative change throughout the lumbar region. Old appearing minor superior endplate depression at L2. No sign of acute lumbar region fracture. No sign of pelvic or hip fracture. IMPRESSION:  Fracture of the left posterolateral seventh, eighth and ninth ribs. Fracture of the posterior tenth and eleventh ribs on the left. No pneumothorax. Left pleural effusion layering dependently with dependent atelectasis. No acute traumatic abdominal or pelvic finding. Aortoiliac stent graft. Aortic aneurysmal sac measuring 6.8 x 8.5 cm. Endoleak not excluded on this noncontrast study. Consider correlation with previous imaging at another location or contrast enhanced examination. No evidence of acute retroperitoneal bleeding. Curvature and chronic degenerative change of the thoracic and lumbar spine. Old appearing minor superior endplate deformity at L2. No acute bone finding seen. Electronically Signed   By: Nelson Chimes M.D.   On: 10/08/2021 15:08    Microbiology: Recent Results (from the past 240 hour(s))  Resp Panel by RT-PCR (Flu A&B, Covid) Nasopharyngeal Swab     Status: None   Collection Time: 10/10/21   9:29 AM   Specimen: Nasopharyngeal Swab; Nasopharyngeal(NP) swabs in vial transport medium  Result Value Ref Range Status   SARS Coronavirus 2 by RT PCR NEGATIVE NEGATIVE Final    Comment: (NOTE) SARS-CoV-2 target nucleic acids are NOT DETECTED.  The SARS-CoV-2 RNA is generally detectable in upper respiratory specimens during the acute phase of infection. The lowest concentration of SARS-CoV-2 viral copies this assay can detect is 138 copies/mL. A negative result does not preclude SARS-Cov-2 infection and should not be used as the sole basis for treatment or other patient management decisions. A negative result may occur with  improper specimen collection/handling, submission of specimen other than nasopharyngeal swab, presence of viral mutation(s) within the areas targeted by this assay, and inadequate number of viral copies(<138 copies/mL). A negative result must be combined with clinical observations, patient history, and epidemiological information. The expected result is Negative.  Fact Sheet for Patients:  EntrepreneurPulse.com.au  Fact Sheet for Healthcare Providers:  IncredibleEmployment.be  This test is no t yet approved or cleared by the [place] FDA and  has been authorized for detection and/or diagnosis of SARS-CoV-2 by FDA under an Emergency Use Authorization (EUA). This EUA will remain  in effect (meaning this test can be used) for the duration of the COVID-19 declaration under Section 564(b)(1) of the Act, 21 U.S.C.section 360bbb-3(b)(1), unless the authorization is terminated  or revoked sooner.       Influenza A by PCR NEGATIVE NEGATIVE Final   Influenza B by PCR NEGATIVE NEGATIVE Final    Comment: (NOTE) The Xpert Xpress SARS-CoV-2/FLU/RSV plus assay is intended as an aid in the diagnosis of influenza from Nasopharyngeal swab specimens and should not be used as a sole basis for treatment. Nasal washings and aspirates  are unacceptable for Xpert Xpress SARS-CoV-2/FLU/RSV testing.  Fact Sheet for Patients: EntrepreneurPulse.com.au  Fact Sheet for Healthcare Providers: IncredibleEmployment.be  This test is not yet approved or cleared by the [place] FDA and has been authorized for detection and/or diagnosis of SARS-CoV-2 by FDA under an Emergency Use Authorization (EUA). This EUA will remain in effect (meaning this test can be used) for the duration of the COVID-19 declaration under Section 564(b)(1) of the Act, 21 U.S.C. section 360bbb-3(b)(1), unless the authorization is terminated or revoked.  Performed at [facility], [address] [place], [place] [address]   Blood culture (routine x 2)     Status: None   Collection Time: 10/10/21 11:31 AM   Specimen: BLOOD  Result Value Ref Range Status   Specimen Description BLOOD LEFT [facility]  Final   Special Requests   Final    BOTTLES DRAWN AEROBIC  AND ANAEROBIC Blood Culture adequate volume   Culture   Final    NO GROWTH 5 DAYS Performed at [facility], [address] [place], [place] [address]    Report Status 10/15/2021 FINAL  Final  Blood culture (routine x 2)     Status: None   Collection Time: 10/10/21 11:31 AM   Specimen: BLOOD  Result Value Ref Range Status   Specimen Description BLOOD RIGHT [facility]  Final   Special Requests   Final    BOTTLES DRAWN AEROBIC AND ANAEROBIC Blood Culture adequate volume   Culture   Final    NO GROWTH 5 DAYS Performed at [facility], [address] [place], [place] [address]    Report Status 10/15/2021 FINAL  Final     Labs: Basic Metabolic Panel: Recent Labs  Lab 10/10/21 0913 10/11/21 0604 10/13/21 0743 10/14/21 0605 10/15/21 0628  NA 135 137 137 139 137  K 4.4 4.8 4.6 3.8 4.1  CL 98 106 106 104 104  CO2 26 22 24 26 27   GLUCOSE 155* 81 113* 111* 90  BUN 32* 32* 25* 22 15  CREATININE 1.82* 1.69* 1.50* 1.44* 1.39*  CALCIUM  9.0 8.6* 8.7* 8.8* 8.5*  MG  --   --  1.7 2.1 2.1  PHOS  --   --  3.0 3.0 3.1   Liver Function Tests: Recent Labs  Lab 10/10/21 0913 10/13/21 0743 10/14/21 0605 10/15/21 0628  AST 20 16 19 16   ALT 13 13 15 14   ALKPHOS 81 83 88 85  BILITOT 1.0 0.6 0.7 0.8  PROT 6.8 5.7* 5.8* 5.6*  ALBUMIN 3.5 2.7* 2.9* 2.5*   Recent Labs  Lab 10/10/21 1347  LIPASE 54*   No results for input(s): AMMONIA in the last 168 hours. CBC: Recent Labs  Lab 10/10/21 0913 10/11/21 0604 10/13/21 0402 10/13/21 0743 10/14/21 0605 10/14/21 1827 10/15/21 0628  WBC 15.0* 10.9*  --  6.9 8.3  --  7.2  NEUTROABS  --   --   --  5.2 6.6  --  5.6  HGB 11.8* 10.0* 9.5* 9.5* 9.7* 9.7* 9.1*  HCT 35.4* 30.6*  --  28.1* 27.7* 27.8* 27.6*  MCV 104.1* 103.4*  --  100.0 101.8*  --  100.7*  PLT 146* 138*  --  141* 154  --  154   Cardiac Enzymes: No results for input(s): CKTOTAL, CKMB, CKMBINDEX, TROPONINI in the last 168 hours. BNP: BNP (last 3 results) Recent Labs    10/10/21 1141  BNP 193.3*    ProBNP (last 3 results) No results for input(s): PROBNP in the last 8760 hours.  CBG: No results for input(s): GLUCAP in the last 168 hours.     Signed:  Dia Crawford, MD [facility]

## 2021-10-15 NOTE — Progress Notes (Signed)
Physical Therapy Treatment Patient Details Name: Claire Kelley MRN: [id number] DOB: 11-23-1926 Today's Date: 10/15/2021   History of Present Illness Pt is a 87 y.o. female presenting to hospital 10/23 with worsening pain in L chest and abdomen (pt fell about 6 days prior and had presented to ED where she was found to have 5 rib fx's but has had a difficult time managing at home since discharge home); now with nausea and frequent vomiting.  Pt admitted with sepsis d/t multifocal PNA, L rib fx's, L pleural effusion, abdominal pain, and nausea/vomiting.  PMH includes a-fib, CAD, gastric reflux, htn, HLD, AICD, abdominal aortic aneurysm repair, CABG.    PT Comments    Patient alert, agreeable to PT with minimal encouragement from family. Reported abdominal/stomach pain, RN aware. Supine to sit with modI, and sit <> stand with RW and CGA. Pt needed momentum and education on proper hand placement, demonstrated safe stand to sit after initial education. She was able to ambulate in the room ~3ft with RW and supervision/CGA. 1 small LOB due to difficulty navigating RW in room (hitting obstacles on R hand side), CGA provided to assist with righting reactions. Pt up in chair at end of session, all needs in reach. The patient would benefit from further skilled PT intervention to continue to progress towards goals. Recommendation remains appropriate.     Recommendations for follow up therapy are one component of a multi-disciplinary discharge planning process, led by the attending physician.  Recommendations may be updated based on patient status, additional functional criteria and insurance authorization.  Follow Up Recommendations  Home health PT     Assistance Recommended at Discharge Set up Supervision/Assistance  Equipment Recommendations  Rolling walker (2 wheels);3in1 (PT)    Recommendations for Other Services OT consult     Precautions / Restrictions Precautions Precautions: Fall Precaution  Comments: rib fractures Restrictions Weight Bearing Restrictions: No     Mobility  Bed Mobility Overal bed mobility: Needs Assistance Bed Mobility: Supine to Sit     Supine to sit: Modified independent (Device/Increase time);HOB elevated          Transfers Overall transfer level: Needs assistance Equipment used: Rolling walker (2 wheels) Transfers: Sit to/from Stand Sit to Stand: Min guard           General transfer comment: several attempts needed to come up into standing;educated on safe hand placement    Ambulation/Gait Ambulation/Gait assistance: Supervision;Min guard Gait Distance (Feet): 50 Feet Assistive device: Rolling walker (2 wheels) Gait Pattern/deviations: Step-through pattern;Decreased stride length;Trunk flexed Gait velocity: decreased   General Gait Details: Ambulated from bed to door way and back x 2 with reports of fatigue. Pt demonstrating increased forward flexion throughout. Pt denies any SOB during ambulation   Stairs             Wheelchair Mobility    Modified Rankin (Stroke Patients Only)       Balance Overall balance assessment: Needs assistance Sitting-balance support: No upper extremity supported;Feet supported Sitting balance-Leahy Scale: Good Sitting balance - Comments: steady sitting reaching within BOS   Standing balance support: No upper extremity supported;During functional activity Standing balance-Leahy Scale: Fair Standing balance comment: one small LOB with difficulty navigating RW in room (tended to run into obkects to the R)                            Cognition Arousal/Alertness: Awake/alert Behavior During Therapy: [facility] for tasks assessed/performed Overall Cognitive  Status: Within Functional Limits for tasks assessed                                          Exercises Other Exercises Other Exercises: LAQ x15 bilaterally ankle pumps x10    General Comments         Pertinent Vitals/Pain Pain Assessment: Faces Faces Pain Scale: Hurts little more Pain Location: abdomen Pain Descriptors / Indicators: Sore;Aching Pain Intervention(s): Limited activity within patient's tolerance;Repositioned;Monitored during session    Home Living                          Prior Function            PT Goals (current goals can now be found in the care plan section) Progress towards PT goals: Progressing toward goals    Frequency    Min 2X/week      PT Plan Current plan remains appropriate    Co-evaluation              AM-PAC PT "6 Clicks" Mobility   Outcome Measure  Help needed turning from your back to your side while in a flat bed without using bedrails?: None Help needed moving from lying on your back to sitting on the side of a flat bed without using bedrails?: None Help needed moving to and from a bed to a chair (including a wheelchair)?: None Help needed standing up from a chair using your arms (e.g., wheelchair or bedside chair)?: None Help needed to walk in hospital room?: A Little Help needed climbing 3-5 steps with a railing? : A Lot 6 Click Score: 21    End of Session Equipment Utilized During Treatment: Gait belt;Oxygen Activity Tolerance: Patient tolerated treatment well Patient left: in chair;with call bell/phone within reach;with chair alarm set;with family/visitor present Nurse Communication: Mobility status PT Visit Diagnosis: Muscle weakness (generalized) (M62.81);History of falling (Z91.81);Other abnormalities of gait and mobility (R26.89)     Time: 7867-5449 PT Time Calculation (min) (ACUTE ONLY): 18 min  Charges:  $Therapeutic Activity: 8-22 mins                     Lieutenant Diego PT, DPT 9:47 AM,10/15/21

## 2021-11-09 ENCOUNTER — Encounter (HOSPITAL_COMMUNITY): Payer: Self-pay | Admitting: Radiology

## 2022-10-27 ENCOUNTER — Ambulatory Visit
Admission: EM | Admit: 2022-10-27 | Discharge: 2022-10-27 | Disposition: A | Discharge status: Home / Self Care | Payer: Medicare Other | Attending: Emergency Medicine | Admitting: Emergency Medicine

## 2022-10-27 DIAGNOSIS — R300 Dysuria: Secondary | ICD-10-CM | POA: Insufficient documentation

## 2022-10-27 DIAGNOSIS — R350 Frequency of micturition: Secondary | ICD-10-CM | POA: Insufficient documentation

## 2022-10-27 LAB — POCT URINALYSIS DIP (MANUAL ENTRY)
Bilirubin, UA: NEGATIVE
Glucose, UA: NEGATIVE mg/dL
Ketones, POC UA: NEGATIVE mg/dL
Nitrite, UA: NEGATIVE
Protein Ur, POC: 100 mg/dL — AB
Spec Grav, UA: 1.015 (ref 1.010–1.025)
Urobilinogen, UA: 0.2 E.U./dL
pH, UA: 8.5 — AB (ref 5.0–8.0)

## 2022-10-27 MED ORDER — CEPHALEXIN 500 MG PO CAPS: 500.0000 mg | ORAL_CAPSULE | Freq: Two times a day (BID) | ORAL | 0 refills | Status: AC

## 2022-10-27 NOTE — ED Triage Notes (Addendum)
Patient to Urgent Care with complaints of urinary frequency and dysuria. Reports also concerned for some urine leaking, states she has had to change her pad multiple times today. Denies any known fevers.   Reports symptoms started October 30th. States she went to [facility] for a Psychologist, forensic replacement, while she was there they placed a urinary catheter for retention (x1 night).

## 2022-10-27 NOTE — Discharge Instructions (Addendum)
Take the antibiotic as directed.  The urine culture is pending.  We will call you if it shows the need to change or discontinue your antibiotic.    Follow up with your primary care provider if your symptoms are not improving.    

## 2022-10-27 NOTE — ED Provider Notes (Signed)
Roderic Palau    CSN: [id number] Arrival date & time: 10/27/22  1530      History   Chief Complaint Chief Complaint  Patient presents with   Urinary Frequency    HPI Kasmira Cacioppo is a 88 y.o. female.  Accompanied by her daughter, patient presents with 1 week history of dysuria and urinary frequency.  She recently had a pacemaker placed and had a urinary catheter during the procedure.  No fever, abdominal pain, flank pain, or other symptoms.  Her medical history includes hypertension, atrial fibrillation, AAA, CKD.    The history is provided by the patient, a relative and medical records.    Past Medical History:  Diagnosis Date   A-fib [facility])    AICD (automatic cardioverter/defibrillator) present    Coronary artery disease    Diverticulosis    GERD (gastroesophageal reflux disease)    GERD (gastroesophageal reflux disease)    High cholesterol    Hypertension    Renal disorder    CKD    Patient Active Problem List   Diagnosis Date Noted   Acute renal failure superimposed on stage 3b chronic kidney disease ([place]) 10/15/2021   Multifocal pneumonia 10/10/2021   Pleural effusion on left 10/10/2021   Hypertension    GERD (gastroesophageal reflux disease)    Coronary artery disease    A-fib (HCC)    Atrial fibrillation, chronic (HCC)    Abdominal pain    Hiatal hernia    AAA (abdominal aortic aneurysm) (HCC)    Left rib fracture    Sepsis ([place])    CKD (chronic kidney disease), stage IV ([place])    Acute cystitis 05/29/2015   Altered mental status 05/27/2015    Past Surgical History:  Procedure Laterality Date   ABDOMINAL AORTIC ANEURYSM REPAIR     CORONARY ARTERY BYPASS GRAFT     PACEMAKER PLACEMENT      OB History   No obstetric history on file.      Home Medications    Prior to Admission medications   Medication Sig Start Date End Date Taking? Authorizing Provider  cephALEXin (KEFLEX) 500 MG capsule Take 1 capsule (500 mg total) by mouth 2  (two) times daily for 5 days. 10/27/22 11/01/22 Yes Sharion Balloon, NP  acetaminophen (TYLENOL) 650 MG CR tablet Take 1,300 mg by mouth every 8 (eight) hours as needed for pain.    [provider]  amiodarone (PACERONE) 200 MG tablet Take 0.5 tablets by mouth daily. 04/20/20   [provider]  Biotin 1000 MCG CHEW Chew 500 mg by mouth daily.    [provider]  cholecalciferol (VITAMIN D) 25 MCG tablet Take 1 tablet (1,000 Units total) by mouth daily. 10/16/21   Allie Bossier, MD  Cholecalciferol (VITAMIN D3) 1000 UNITS CAPS Take 1 capsule by mouth daily.    [provider]  clobetasol cream (TEMOVATE) 8.92 % Apply 1 application topically daily as needed.    [provider]  Cranberry 500 MG CAPS Take 1 capsule by mouth daily.    [provider]  CRANBERRY-VITAMIN C PO Take 1 capsule by mouth 2 (two) times daily. Patient not taking: No sig reported    [provider]  Cranberry-Vitamin C-Vitamin E 140-100-3 MG-MG-UNIT CAPS Take by mouth.    [provider]  donepezil (ARICEPT) 5 MG tablet Take by mouth. 08/16/18   [provider]  Iron-Vitamin C 100-250 MG TABS Take by mouth.    [provider]  magnesium gluconate (MAGONATE) 500 MG tablet Take 250 mg by mouth 2 (two) times daily. Patient not taking: No sig reported    [provider]  melatonin 1 MG TABS tablet Take 1 tablet by mouth at bedtime as needed.    [provider]  metoprolol succinate (TOPROL-XL) 25 MG 24 hr tablet Take 0.5 tablets (12.5 mg total) by mouth daily. 10/16/21   Allie Bossier, MD  oxybutynin (DITROPAN-XL) 5 MG 24 hr tablet Take 1 tablet by mouth at bedtime. 08/25/21   [provider]  pantoprazole (PROTONIX) 40 MG tablet Take 40 mg by mouth daily.    [provider]  prochlorperazine (COMPAZINE) 5 MG tablet Take 1 tablet by mouth daily as needed.    [provider]  rosuvastatin (CRESTOR) 5 MG  tablet Take 5 mg by mouth daily.    [provider]  senna-docusate (SENOKOT-S) 8.6-50 MG tablet Take 1 tablet by mouth as needed.    [provider]  traMADol (ULTRAM) 50 MG tablet Take 1 tablet (50 mg total) by mouth every 6 (six) hours as needed for moderate pain (May increase to 2 tablets q 6 hrs PRN). 10/15/21   Allie Bossier, MD  vitamin B-12 (CYANOCOBALAMIN) 100 MCG tablet Take 100 mcg by mouth daily.    [provider]    Family History Family History  Problem Relation Age of Onset   Heart disease Sister    Heart disease Brother     Social History Social History   Tobacco Use   Smoking status: Former   Smokeless tobacco: Never  Substance Use Topics   Alcohol use: No   Drug use: Never     Allergies   Bactrim [sulfamethoxazole-trimethoprim], Ciprocinonide [fluocinolone], Ciprofloxacin, Codeine, Lipitor [atorvastatin], Macrobid [nitrofurantoin monohyd macro], and Paxil [paroxetine hcl]   Review of Systems Review of Systems  Constitutional:  Negative for chills and fever.  Gastrointestinal:  Negative for abdominal pain, nausea and vomiting.  Genitourinary:  Positive for dysuria and frequency. Negative for hematuria.  All other systems reviewed and are negative.    Physical Exam Triage Vital Signs ED Triage Vitals  Enc Vitals Group     BP      Pulse      Resp      Temp      Temp src      SpO2      Weight      Height      Head Circumference      Peak Flow      Pain Score      Pain Loc      Pain Edu?      Excl. in [place]?    No data found.  Updated Vital Signs BP 137/78   Pulse 84   Temp 98.4 F (36.9 C)   Resp 18   Ht 5\' 6"  (1.676 m)   Wt 113 lb (51.3 kg)   SpO2 94%   BMI 18.24 kg/m   Visual Acuity Right Eye Distance:   Left Eye Distance:   Bilateral Distance:    Right Eye Near:   Left Eye Near:    Bilateral Near:     Physical Exam Vitals and nursing note reviewed.  Constitutional:      General: She is not  in acute distress.    Appearance: Normal appearance. She is well-developed. She is not ill-appearing.  HENT:     Mouth/Throat:     Mouth: Mucous membranes are moist.  Cardiovascular:     Rate and Rhythm: Normal rate and regular rhythm.     Heart sounds: Normal heart sounds.  Pulmonary:     Effort: Pulmonary effort is normal. No respiratory distress.     Breath sounds: Normal breath sounds.  Abdominal:     General: Bowel sounds are normal.     Palpations: Abdomen is soft.     Tenderness: There is no abdominal tenderness. There is no right CVA tenderness, left CVA tenderness, guarding or rebound.  Musculoskeletal:     Cervical back: Neck supple.  Skin:    General: Skin is warm and dry.  Neurological:     Mental Status: She is alert.  Psychiatric:        Mood and Affect: Mood normal.        Behavior: Behavior normal.      UC Treatments / Results  Labs (all labs ordered are listed, but only abnormal results are displayed) Labs Reviewed  POCT URINALYSIS DIP (MANUAL ENTRY) - Abnormal; Notable for the following components:      Result Value   Clarity, UA cloudy (*)    Blood, UA trace-intact (*)    pH, UA 8.5 (*)    Protein Ur, POC =100 (*)    Leukocytes, UA Moderate (2+) (*)    All other components within normal limits  URINE CULTURE    EKG   Radiology No results found.  Procedures Procedures (including critical care time)  Medications Ordered in UC Medications - No data to display  Initial Impression / Assessment and Plan / UC Course  I have reviewed the triage vital signs and the nursing notes.  Pertinent labs & imaging results that were available during my care of the patient were reviewed by me and considered in my medical decision making (see chart for details).    Urinary frequency, Dysuria.  Treating with Keflex. Urine culture pending. Discussed with patient that we will call her if the urine culture shows the need to change or discontinue the antibiotic.  Instructed her to follow-up with her PCP if her symptoms are not improving. Patient agrees to plan of care.     Final Clinical Impressions(s) / UC Diagnoses   Final diagnoses:  Urinary frequency  Dysuria     Discharge Instructions      Take the antibiotic as directed.  The urine culture is pending.  We will call you if it shows the need to change or discontinue your antibiotic.    Follow up with your primary care provider if your symptoms are not improving.        ED Prescriptions     Medication Sig Dispense Auth. Provider   cephALEXin (KEFLEX) 500 MG capsule Take 1 capsule (500 mg total) by mouth 2 (two) times daily for 5 days. 10 capsule Sharion Balloon, NP      PDMP not reviewed this encounter.   Sharion Balloon, NP 10/27/22 1640

## 2022-10-29 LAB — URINE CULTURE: Culture: >=100000 — AB

## 2023-01-04 ENCOUNTER — Ambulatory Visit
Admission: EM | Admit: 2023-01-04 | Discharge: 2023-01-04 | Disposition: A | Discharge status: Home / Self Care | Payer: Medicare Other | Attending: Emergency Medicine | Admitting: Emergency Medicine

## 2023-01-04 DIAGNOSIS — I10 Essential (primary) hypertension: Secondary | ICD-10-CM | POA: Diagnosis not present

## 2023-01-04 DIAGNOSIS — R350 Frequency of micturition: Secondary | ICD-10-CM | POA: Diagnosis present

## 2023-01-04 DIAGNOSIS — R531 Weakness: Secondary | ICD-10-CM | POA: Insufficient documentation

## 2023-01-04 LAB — POCT URINALYSIS DIP (MANUAL ENTRY)
Bilirubin, UA: NEGATIVE
Glucose, UA: NEGATIVE mg/dL
Ketones, POC UA: NEGATIVE mg/dL
Nitrite, UA: NEGATIVE
Protein Ur, POC: NEGATIVE mg/dL
Spec Grav, UA: 1.01 (ref 1.010–1.025)
Urobilinogen, UA: 0.2 E.U./dL
pH, UA: 5 (ref 5.0–8.0)

## 2023-01-04 MED ORDER — CEPHALEXIN 500 MG PO CAPS: 500.0000 mg | ORAL_CAPSULE | Freq: Two times a day (BID) | ORAL | 0 refills | Status: AC

## 2023-01-04 NOTE — ED Provider Notes (Signed)
Claire Kelley    CSN: [id number] Arrival date & time: 01/04/23  1605      History   Chief Complaint Chief Complaint  Patient presents with   Urinary Frequency    HPI Claire Kelley is a 89 y.o. female.  Accompanied by her daughter, patient presents with urinary frequency, urinary urgency, and generalized weakness since this morning.  Patient denies fever, chest pain, shortness of breath, focal weakness, numbness, dizziness, abdominal pain, dysuria, or other symptoms.  Patient's daughter contacted her PCP and cardiologist today to report patient had elevated blood pressure and dizziness today; She was instructed to give blood pressure medication and monitor her blood pressure.  Patient was seen here on 10/27/2022; diagnosed with urinary frequency and dysuria; treated with cephalexin; urine culture >100,000 proteus mirabilis.  Her medical history includes hypertension, atrial fibrillation, cardiac pacemaker, CKD, COPD.   The history is provided by the patient, a relative and medical records.    Past Medical History:  Diagnosis Date   A-fib [facility])    AICD (automatic cardioverter/defibrillator) present    Coronary artery disease    Diverticulosis    GERD (gastroesophageal reflux disease)    GERD (gastroesophageal reflux disease)    High cholesterol    Hypertension    Renal disorder    CKD    Patient Active Problem List   Diagnosis Date Noted   Acute renal failure superimposed on stage 3b chronic kidney disease ([place]) 10/15/2021   Multifocal pneumonia 10/10/2021   Pleural effusion on left 10/10/2021   Hypertension    GERD (gastroesophageal reflux disease)    Coronary artery disease    A-fib (HCC)    Atrial fibrillation, chronic (HCC)    Abdominal pain    Hiatal hernia    AAA (abdominal aortic aneurysm) (HCC)    Left rib fracture    Sepsis ([place])    CKD (chronic kidney disease), stage IV ([place])    Acute cystitis 05/29/2015   Altered mental status 05/27/2015     Past Surgical History:  Procedure Laterality Date   ABDOMINAL AORTIC ANEURYSM REPAIR     CORONARY ARTERY BYPASS GRAFT     PACEMAKER PLACEMENT      OB History   No obstetric history on file.      Home Medications    Prior to Admission medications   Medication Sig Start Date End Date Taking? Authorizing Provider  cephALEXin (KEFLEX) 500 MG capsule Take 1 capsule (500 mg total) by mouth 2 (two) times daily for 5 days. 01/04/23 01/09/23 Yes Sharion Balloon, NP  acetaminophen (TYLENOL) 650 MG CR tablet Take 1,300 mg by mouth every 8 (eight) hours as needed for pain.    [provider]  amiodarone (PACERONE) 200 MG tablet Take 0.5 tablets by mouth daily. 04/20/20   [provider]  Biotin 1000 MCG CHEW Chew 500 mg by mouth daily.    [provider]  cholecalciferol (VITAMIN D) 25 MCG tablet Take 1 tablet (1,000 Units total) by mouth daily. 10/16/21   Allie Bossier, MD  Cholecalciferol (VITAMIN D3) 1000 UNITS CAPS Take 1 capsule by mouth daily.    [provider]  clobetasol cream (TEMOVATE) 4.09 % Apply 1 application topically daily as needed.    [provider]  Cranberry 500 MG CAPS Take 1 capsule by mouth daily.    [provider]  CRANBERRY-VITAMIN C PO Take 1 capsule by mouth 2 (two) times daily. Patient not taking: No sig reported  [provider]  Cranberry-Vitamin C-Vitamin E 140-100-3 MG-MG-UNIT CAPS Take by mouth.    [provider]  donepezil (ARICEPT) 5 MG tablet Take by mouth. 08/16/18   [provider]  Iron-Vitamin C 100-250 MG TABS Take by mouth.    [provider]  magnesium gluconate (MAGONATE) 500 MG tablet Take 250 mg by mouth 2 (two) times daily. Patient not taking: No sig reported    [provider]  melatonin 1 MG TABS tablet Take 1 tablet by mouth at bedtime as needed.    [provider]  metoprolol succinate (TOPROL-XL) 25 MG 24 hr tablet Take 0.5 tablets  (12.5 mg total) by mouth daily. 10/16/21   Allie Bossier, MD  oxybutynin (DITROPAN-XL) 5 MG 24 hr tablet Take 1 tablet by mouth at bedtime. 08/25/21   [provider]  pantoprazole (PROTONIX) 40 MG tablet Take 40 mg by mouth daily.    [provider]  prochlorperazine (COMPAZINE) 5 MG tablet Take 1 tablet by mouth daily as needed.    [provider]  rosuvastatin (CRESTOR) 5 MG tablet Take 5 mg by mouth daily.    [provider]  senna-docusate (SENOKOT-S) 8.6-50 MG tablet Take 1 tablet by mouth as needed.    [provider]  traMADol (ULTRAM) 50 MG tablet Take 1 tablet (50 mg total) by mouth every 6 (six) hours as needed for moderate pain (May increase to 2 tablets q 6 hrs PRN). 10/15/21   Allie Bossier, MD  vitamin B-12 (CYANOCOBALAMIN) 100 MCG tablet Take 100 mcg by mouth daily.    [provider]    Family History Family History  Problem Relation Age of Onset   Heart disease Sister    Heart disease Brother     Social History Social History   Tobacco Use   Smoking status: Former   Smokeless tobacco: Never  Substance Use Topics   Alcohol use: No   Drug use: Never     Allergies   Bactrim [sulfamethoxazole-trimethoprim], Ciprocinonide [fluocinolone], Ciprofloxacin, Codeine, Lipitor [atorvastatin], Macrobid [nitrofurantoin monohyd macro], and Paxil [paroxetine hcl]   Review of Systems Review of Systems  Constitutional:  Negative for chills and fever.  Respiratory:  Negative for cough and shortness of breath.   Cardiovascular:  Negative for chest pain and palpitations.  Gastrointestinal:  Negative for abdominal pain and vomiting.  Genitourinary:  Positive for frequency and urgency. Negative for dysuria and hematuria.  Neurological:  Positive for weakness. Negative for dizziness, facial asymmetry, speech difficulty and numbness.       Generalized weakness. No focal weakness.   All other systems reviewed and are  negative.    Physical Exam Triage Vital Signs ED Triage Vitals  Enc Vitals Group     BP      Pulse      Resp      Temp      Temp src      SpO2      Weight      Height      Head Circumference      Peak Flow      Pain Score      Pain Loc      Pain Edu?      Excl. in [place]?    No data found.  Updated Vital Signs BP (!) 161/92   Pulse 87   Temp (!) 97.3 F (36.3 C) (Temporal)   Resp 18   Ht 5\' 6"  (1.676 m)  Wt 114 lb (51.7 kg)   SpO2 95%   BMI 18.40 kg/m   Visual Acuity Right Eye Distance:   Left Eye Distance:   Bilateral Distance:    Right Eye Near:   Left Eye Near:    Bilateral Near:     Physical Exam Vitals and nursing note reviewed.  Constitutional:      General: She is not in acute distress.    Appearance: She is well-developed. She is not ill-appearing.  HENT:     Mouth/Throat:     Mouth: Mucous membranes are moist.  Cardiovascular:     Rate and Rhythm: Normal rate and regular rhythm.     Heart sounds: Normal heart sounds.  Pulmonary:     Effort: Pulmonary effort is normal. No respiratory distress.     Breath sounds: Normal breath sounds.  Abdominal:     General: Bowel sounds are normal.     Palpations: Abdomen is soft.     Tenderness: There is no abdominal tenderness. There is no right CVA tenderness, left CVA tenderness, guarding or rebound.  Musculoskeletal:     Cervical back: Neck supple.  Skin:    General: Skin is warm and dry.  Neurological:     General: No focal deficit present.     Mental Status: She is alert.     Sensory: No sensory deficit.     Motor: No weakness.     Comments: No focal weakness.  Psychiatric:        Mood and Affect: Mood normal.        Behavior: Behavior normal.      UC Treatments / Results  Labs (all labs ordered are listed, but only abnormal results are displayed) Labs Reviewed  POCT URINALYSIS DIP (MANUAL ENTRY) - Abnormal; Notable for the following components:      Result Value   Clarity, UA cloudy  (*)    Blood, UA trace-intact (*)    Leukocytes, UA Small (1+) (*)    All other components within normal limits  URINE CULTURE    EKG   Radiology No results found.  Procedures Procedures (including critical care time)  Medications Ordered in UC Medications - No data to display  Initial Impression / Assessment and Plan / UC Course  I have reviewed the triage vital signs and the nursing notes.  Pertinent labs & imaging results that were available during my care of the patient were reviewed by me and considered in my medical decision making (see chart for details).    Urinary frequency, generalized weakness, elevated blood pressure with hypertension. Treating with Keflex. Urine culture pending. Discussed with patient that we will call her if the urine culture shows the need to change or discontinue the antibiotic.  Also discussed with patient that her blood pressure is elevated today and needs to be rechecked by PCP in 1-2 weeks.  Education provided on managing hypertension.   ED precautions discussed for weakness or other concerning symptoms.  Patient and her daughter agree to plan of care.     Final Clinical Impressions(s) / UC Diagnoses   Final diagnoses:  Urinary frequency  Elevated blood pressure reading in office with diagnosis of hypertension  Generalized weakness     Discharge Instructions      Take the antibiotic as directed.  The urine culture is pending.  We will call you if it shows the need to change or discontinue your antibiotic.    Your blood pressure is elevated today at 161/92; repeat  147/87.  Please have this rechecked by your primary care provider in 1-2 weeks.      Go to the emergency department if you have increased weakness or other concerning symptoms.          ED Prescriptions     Medication Sig Dispense Auth. Provider   cephALEXin (KEFLEX) 500 MG capsule Take 1 capsule (500 mg total) by mouth 2 (two) times daily for 5 days. 10 capsule Sharion Balloon, NP      PDMP not reviewed this encounter.   Sharion Balloon, NP 01/04/23 [phone]

## 2023-01-04 NOTE — Discharge Instructions (Addendum)
Take the antibiotic as directed.  The urine culture is pending.  We will call you if it shows the need to change or discontinue your antibiotic.    Your blood pressure is elevated today at 161/92; repeat 147/87.  Please have this rechecked by your primary care provider in 1-2 weeks.      Go to the emergency department if you have increased weakness or other concerning symptoms.

## 2023-01-04 NOTE — ED Triage Notes (Addendum)
Patient to Urgent Care with complaints of urinary symptoms, concerned about a UTI. Reports urinary frequency and urgency. Denies any pain or dysuria.  Reports symptoms started this morning.   Denies any known fevers.   Also reports constant runny nose, has tried nasal spray and otc allergy medicine without relief.

## 2023-01-06 LAB — URINE CULTURE: Culture: >=100000 — AB

## 2023-01-09 ENCOUNTER — Telehealth (HOSPITAL_COMMUNITY): Payer: Self-pay | Admitting: Emergency Medicine

## 2023-01-09 MED ORDER — CEFDINIR 300 MG PO CAPS: 300.0000 mg | ORAL_CAPSULE | Freq: Two times a day (BID) | ORAL | 0 refills | Status: DC

## 2023-01-11 ENCOUNTER — Telehealth (HOSPITAL_COMMUNITY): Payer: Self-pay | Admitting: Family Medicine

## 2023-01-11 MED ORDER — CEFUROXIME AXETIL 250 MG PO TABS: 250.0000 mg | ORAL_TABLET | Freq: Two times a day (BID) | ORAL | 0 refills | Status: DC

## 2023-01-11 MED ORDER — CEFDINIR 300 MG PO CAPS: 300.0000 mg | ORAL_CAPSULE | Freq: Two times a day (BID) | ORAL | 0 refills | Status: AC

## 2023-01-11 NOTE — Telephone Encounter (Signed)
Ceftin printed (did not realize cefdinir sent. Cefdinir printed and to be faxed to her nursing facility

## 2023-01-11 NOTE — Telephone Encounter (Signed)
Daughter updated that it has been sent, she will follow up if any issues

## 2023-05-23 ENCOUNTER — Ambulatory Visit
Admission: EM | Admit: 2023-05-23 | Discharge: 2023-05-23 | Disposition: A | Discharge status: Home / Self Care | Payer: Medicare Other | Attending: Emergency Medicine | Admitting: Emergency Medicine

## 2023-05-23 ENCOUNTER — Ambulatory Visit (INDEPENDENT_AMBULATORY_CARE_PROVIDER_SITE_OTHER): Payer: Medicare Other

## 2023-05-23 DIAGNOSIS — S41111A Laceration without foreign body of right upper arm, initial encounter: Secondary | ICD-10-CM | POA: Diagnosis not present

## 2023-05-23 DIAGNOSIS — M25422 Effusion, left elbow: Secondary | ICD-10-CM

## 2023-05-23 DIAGNOSIS — M25521 Pain in right elbow: Secondary | ICD-10-CM

## 2023-05-23 DIAGNOSIS — W19XXXA Unspecified fall, initial encounter: Secondary | ICD-10-CM

## 2023-05-23 DIAGNOSIS — M25442 Effusion, left hand: Secondary | ICD-10-CM | POA: Diagnosis not present

## 2023-05-23 LAB — POCT URINALYSIS DIP (MANUAL ENTRY)
Bilirubin, UA: NEGATIVE
Blood, UA: NEGATIVE
Glucose, UA: NEGATIVE mg/dL
Ketones, POC UA: NEGATIVE mg/dL
Leukocytes, UA: NEGATIVE
Nitrite, UA: NEGATIVE
Spec Grav, UA: 1.02 (ref 1.010–1.025)
Urobilinogen, UA: 0.2 E.U./dL
pH, UA: 5.5 (ref 5.0–8.0)

## 2023-05-23 NOTE — ED Triage Notes (Signed)
Patient to Urgent Care with complaints of urinary frequency that started this morning.  Reports a ground level fall this morning when using the bathroom, states that she tore the skin on the back of her right upper arm and bruised/ hurt her middle finger on her left finger. Denies any LOC/ hitting head. Was able to pull herself up and get into bed.

## 2023-05-23 NOTE — Discharge Instructions (Signed)
Today you are evaluated for a fall  Urinalysis is negative for infection  Skin tear has been closed using skin adhesive and covered with a nonstick Band-Aid, while at home you may cleanse daily with soap and water, pat and do not rub and cover with a nonstick bandage to prevent friction and irritation until healed  At any point if you begin to notice signs of infection such as increased redness, increased swelling, puslike drainage please return for reevaluation  X-ray of the elbow and the finger are negative for injury to the bone  You may take Tylenol every 6 hours as needed for any aches or pains  You may use ice over the affected area in 10-minute intervals to help reduce swelling  You may prop hand and arm on 2 pillows whenever sitting and lying to help provide comfort and support   may follow-up with his urgent care as needed

## 2023-05-23 NOTE — ED Provider Notes (Signed)
[name]    CSN: [id number] Arrival date & time: 05/23/23  1617      History   Chief Complaint Chief Complaint  Patient presents with   Urinary Frequency   Fall    HPI Claire Kelley is a 89 y.o. female.   Patient presents for urinary frequency that began approximately around 3 AM this morning.  Has begun to resolve as the day progressed.  History of overactive bladder.  Had a fall while returning from the restroom, denies hitting head or loss of consciousness.  Has abrasion present to the right forearm, covered with bandage.  Has swelling and bruising present to the left middle finger, denies pain.  Able to complete range of motion.  Denies numbness or tingling.  Takes Eliquis.  Past Medical History:  Diagnosis Date   A-fib [facility])    AICD (automatic cardioverter/defibrillator) present    Coronary artery disease    Diverticulosis    GERD (gastroesophageal reflux disease)    GERD (gastroesophageal reflux disease)    High cholesterol    Hypertension    Renal disorder    CKD    Patient Active Problem List   Diagnosis Date Noted   Acute renal failure superimposed on stage 3b chronic kidney disease (HCC) 10/15/2021   Multifocal pneumonia 10/10/2021   Pleural effusion on left 10/10/2021   Hypertension    GERD (gastroesophageal reflux disease)    Coronary artery disease    A-fib (HCC)    Atrial fibrillation, chronic (HCC)    Abdominal pain    Hiatal hernia    AAA (abdominal aortic aneurysm) (HCC)    Left rib fracture    Sepsis (HCC)    CKD (chronic kidney disease), stage IV (HCC)    Acute cystitis 05/29/2015   Altered mental status 05/27/2015    Past Surgical History:  Procedure Laterality Date   ABDOMINAL AORTIC ANEURYSM REPAIR     CORONARY ARTERY BYPASS GRAFT     PACEMAKER PLACEMENT      OB History   No obstetric history on file.      Home Medications    Prior to Admission medications   Medication Sig Start Date End Date Taking?  Authorizing Provider  acetaminophen (TYLENOL) 650 MG CR tablet Take 1,300 mg by mouth every 8 (eight) hours as needed for pain.    [provider]  amiodarone (PACERONE) 200 MG tablet Take 0.5 tablets by mouth daily. 04/20/20   [provider]  Biotin 1000 MCG CHEW Chew 500 mg by mouth daily.    [provider]  cholecalciferol (VITAMIN D) 25 MCG tablet Take 1 tablet (1,000 Units total) by mouth daily. 10/16/21   Drema Dallas, MD  Cholecalciferol (VITAMIN D3) 1000 UNITS CAPS Take 1 capsule by mouth daily.    [provider]  clobetasol cream (TEMOVATE) 0.05 % Apply 1 application topically daily as needed.    [provider]  Cranberry 500 MG CAPS Take 1 capsule by mouth daily.    [provider]  CRANBERRY-VITAMIN C PO Take 1 capsule by mouth 2 (two) times daily. Patient not taking: No sig reported    [provider]  Cranberry-Vitamin C-Vitamin E 140-100-3 MG-MG-UNIT CAPS Take by mouth.    [provider]  donepezil (ARICEPT) 5 MG tablet Take by mouth. 08/16/18   [provider]  Iron-Vitamin C 100-250 MG TABS Take by mouth.    [provider]  magnesium gluconate (MAGONATE) 500 MG tablet Take 250 mg  by mouth 2 (two) times daily. Patient not taking: No sig reported    [provider]  melatonin 1 MG TABS tablet Take 1 tablet by mouth at bedtime as needed.    [provider]  metoprolol succinate (TOPROL-XL) 25 MG 24 hr tablet Take 0.5 tablets (12.5 mg total) by mouth daily. 10/16/21   Drema Dallas, MD  oxybutynin (DITROPAN-XL) 5 MG 24 hr tablet Take 1 tablet by mouth at bedtime. 08/25/21   [provider]  pantoprazole (PROTONIX) 40 MG tablet Take 40 mg by mouth daily.    [provider]  prochlorperazine (COMPAZINE) 5 MG tablet Take 1 tablet by mouth daily as needed.    [provider]  rosuvastatin (CRESTOR) 5 MG tablet Take 5 mg by mouth daily.    [provider]  senna-docusate (SENOKOT-S) 8.6-50 MG tablet Take 1 tablet by mouth as needed.    [provider]  traMADol (ULTRAM) 50 MG tablet Take 1 tablet (50 mg total) by mouth every 6 (six) hours as needed for moderate pain (May increase to 2 tablets q 6 hrs PRN). 10/15/21   Drema Dallas, MD  vitamin B-12 (CYANOCOBALAMIN) 100 MCG tablet Take 100 mcg by mouth daily.    [provider]    Family History Family History  Problem Relation Age of Onset   Heart disease Sister    Heart disease Brother     Social History Social History   Tobacco Use   Smoking status: Former   Smokeless tobacco: Never  Substance Use Topics   Alcohol use: No   Drug use: Never     Allergies   Bactrim [sulfamethoxazole-trimethoprim], Ciprocinonide [fluocinolone], Ciprofloxacin, Codeine, Lipitor [atorvastatin], Macrobid [nitrofurantoin monohyd macro], and Paxil [paroxetine hcl]   Review of Systems Review of Systems  Genitourinary:  Positive for frequency.     Physical Exam Triage Vital Signs ED Triage Vitals  Enc Vitals Group     BP 05/23/23 1654 113/65     Pulse Rate 05/23/23 1654 73     Resp 05/23/23 1654 16     Temp 05/23/23 1654 97.8 F (36.6 C)     Temp src --      SpO2 05/23/23 1654 93 %     Weight --      Height --      Head Circumference --      Peak Flow --      Pain Score 05/23/23 1653 8     Pain Loc --      Pain Edu? --      Excl. in [place]? --    No data found.  Updated Vital Signs BP 113/65   Pulse 73   Temp 97.8 F (36.6 C)   Resp 16   SpO2 93%   Visual Acuity Right Eye Distance:   Left Eye Distance:   Bilateral Distance:    Right Eye Near:   Left Eye Near:    Bilateral Near:     Physical Exam Constitutional:      Appearance: Normal appearance.  Eyes:     Extraocular Movements: Extraocular movements intact.  Musculoskeletal:     Comments: Moderate swelling and ecchymosis present to the middle phalanx of the left middle finger,  nontender, able to flex and extend, sensation intact, capillary refill less than 3, 2+ radial pulse  Skin:    Comments: 3 x 4 cm skin tear present to the posterior of the right upper extremity  Neurological:  Mental Status: She is alert and oriented to person, place, and time. Mental status is at baseline.      UC Treatments / Results  Labs (all labs ordered are listed, but only abnormal results are displayed) Labs Reviewed  POCT URINALYSIS DIP (MANUAL ENTRY) - Abnormal; Notable for the following components:      Result Value   Protein Ur, POC trace (*)    All other components within normal limits    EKG   Radiology No results found.  Procedures Wound Care  Date/Time: 05/23/2023 6:25 PM  Performed by: Valinda Hoar, NP Authorized by: Valinda Hoar, NP   Consent:    Consent obtained:  Verbal   Consent given by:  Patient   Risks discussed:  Infection, pain and poor cosmetic result Universal protocol:    Procedure explained and questions answered to patient or proxy's satisfaction: yes     Patient identity confirmed:  Verbally with patient Anesthesia:    Anesthesia method:  None Procedure details:    Wound location:  Arm   Shoulder/arm location:  R upper arm   Wound age (days):  <1 Dressing:    Dressing applied:  Telfa pad and 4x4 Post-procedure details:    Procedure completion:  Tolerated Comments:     Skin tear closed with tissue adhesive   (including critical care time)  Medications Ordered in UC Medications - No data to display  Initial Impression / Assessment and Plan / UC Course  I have reviewed the triage vital signs and the nursing notes.  Pertinent labs & imaging results that were available during my care of the patient were reviewed by me and considered in my medical decision making (see chart for details).  Right upper arm without complication, initial encounter, right elbow pain, left finger joint swelling, fall  Due to fall and age  urinalysis completed, negative, fall most likely mechanical , skin tear read here with skin adhesive, advised daily cleansing with soap and water and monitoring for infection, cover with a nonadherent dressing to prevent further friction or irritation, x-ray of the elbow and the finger are negative, discussed findings with patient and daughter, recommended supportive care through over-the-counter analgesics and RICE, may follow-up with his urgent care or primary doctor as needed for reevaluation Final Clinical Impressions(s) / UC Diagnoses   Final diagnoses:  Fall, initial encounter  Pleural effusion on left  CKD (chronic kidney disease), stage IV (HCC)  Hiatal hernia  Atrial fibrillation, chronic (HCC)  Coronary artery disease due to calcified coronary lesion  Multifocal pneumonia  Glasgow coma scale total score 9-12, at arrival to emergency department   Discharge Instructions   None    ED Prescriptions   None    PDMP not reviewed this encounter.   Valinda Hoar, NP 05/23/23 [phone]

## 2023-12-31 ENCOUNTER — Other Ambulatory Visit: Payer: Self-pay

## 2023-12-31 ENCOUNTER — Emergency Department: Payer: Medicare Other

## 2023-12-31 ENCOUNTER — Inpatient Hospital Stay
Admission: EM | Admit: 2023-12-31 | Discharge: 2024-01-05 | DRG: 640 | Disposition: A | Discharge status: Skilled Nursing Facility | Payer: Medicare Other | Source: Skilled Nursing Facility | Attending: Internal Medicine | Admitting: Internal Medicine

## 2023-12-31 DIAGNOSIS — E7800 Pure hypercholesterolemia, unspecified: Secondary | ICD-10-CM | POA: Diagnosis present

## 2023-12-31 DIAGNOSIS — K219 Gastro-esophageal reflux disease without esophagitis: Secondary | ICD-10-CM | POA: Diagnosis present

## 2023-12-31 DIAGNOSIS — G9341 Metabolic encephalopathy: Secondary | ICD-10-CM | POA: Diagnosis present

## 2023-12-31 DIAGNOSIS — I2510 Atherosclerotic heart disease of native coronary artery without angina pectoris: Secondary | ICD-10-CM | POA: Diagnosis present

## 2023-12-31 DIAGNOSIS — N184 Chronic kidney disease, stage 4 (severe): Secondary | ICD-10-CM | POA: Diagnosis present

## 2023-12-31 DIAGNOSIS — R4182 Altered mental status, unspecified: Principal | ICD-10-CM

## 2023-12-31 DIAGNOSIS — Z87891 Personal history of nicotine dependence: Secondary | ICD-10-CM

## 2023-12-31 DIAGNOSIS — K5730 Diverticulosis of large intestine without perforation or abscess without bleeding: Secondary | ICD-10-CM | POA: Diagnosis present

## 2023-12-31 DIAGNOSIS — R109 Unspecified abdominal pain: Secondary | ICD-10-CM | POA: Diagnosis present

## 2023-12-31 DIAGNOSIS — Z1152 Encounter for screening for COVID-19: Secondary | ICD-10-CM

## 2023-12-31 DIAGNOSIS — D631 Anemia in chronic kidney disease: Secondary | ICD-10-CM | POA: Diagnosis present

## 2023-12-31 DIAGNOSIS — Z8249 Family history of ischemic heart disease and other diseases of the circulatory system: Secondary | ICD-10-CM

## 2023-12-31 DIAGNOSIS — E875 Hyperkalemia: Principal | ICD-10-CM | POA: Diagnosis present

## 2023-12-31 DIAGNOSIS — I700 Atherosclerosis of aorta: Secondary | ICD-10-CM | POA: Diagnosis present

## 2023-12-31 DIAGNOSIS — I82409 Acute embolism and thrombosis of unspecified deep veins of unspecified lower extremity: Secondary | ICD-10-CM | POA: Diagnosis present

## 2023-12-31 DIAGNOSIS — K449 Diaphragmatic hernia without obstruction or gangrene: Secondary | ICD-10-CM

## 2023-12-31 DIAGNOSIS — Z7901 Long term (current) use of anticoagulants: Secondary | ICD-10-CM

## 2023-12-31 DIAGNOSIS — Z888 Allergy status to other drugs, medicaments and biological substances status: Secondary | ICD-10-CM

## 2023-12-31 DIAGNOSIS — I10 Essential (primary) hypertension: Secondary | ICD-10-CM | POA: Diagnosis not present

## 2023-12-31 DIAGNOSIS — I5032 Chronic diastolic (congestive) heart failure: Secondary | ICD-10-CM | POA: Diagnosis present

## 2023-12-31 DIAGNOSIS — I130 Hypertensive heart and chronic kidney disease with heart failure and stage 1 through stage 4 chronic kidney disease, or unspecified chronic kidney disease: Secondary | ICD-10-CM | POA: Diagnosis present

## 2023-12-31 DIAGNOSIS — Z79899 Other long term (current) drug therapy: Secondary | ICD-10-CM

## 2023-12-31 DIAGNOSIS — Z95810 Presence of automatic (implantable) cardiac defibrillator: Secondary | ICD-10-CM

## 2023-12-31 DIAGNOSIS — R636 Underweight: Secondary | ICD-10-CM | POA: Diagnosis present

## 2023-12-31 DIAGNOSIS — J4489 Other specified chronic obstructive pulmonary disease: Secondary | ICD-10-CM | POA: Diagnosis present

## 2023-12-31 DIAGNOSIS — R531 Weakness: Secondary | ICD-10-CM

## 2023-12-31 DIAGNOSIS — I482 Chronic atrial fibrillation, unspecified: Secondary | ICD-10-CM | POA: Diagnosis present

## 2023-12-31 DIAGNOSIS — Z883 Allergy status to other anti-infective agents status: Secondary | ICD-10-CM

## 2023-12-31 DIAGNOSIS — I25810 Atherosclerosis of coronary artery bypass graft(s) without angina pectoris: Secondary | ICD-10-CM | POA: Diagnosis present

## 2023-12-31 DIAGNOSIS — E8721 Acute metabolic acidosis: Secondary | ICD-10-CM | POA: Diagnosis present

## 2023-12-31 DIAGNOSIS — I714 Abdominal aortic aneurysm, without rupture, unspecified: Secondary | ICD-10-CM | POA: Diagnosis present

## 2023-12-31 DIAGNOSIS — I255 Ischemic cardiomyopathy: Secondary | ICD-10-CM | POA: Diagnosis present

## 2023-12-31 DIAGNOSIS — I480 Paroxysmal atrial fibrillation: Secondary | ICD-10-CM | POA: Diagnosis present

## 2023-12-31 DIAGNOSIS — N179 Acute kidney failure, unspecified: Secondary | ICD-10-CM | POA: Diagnosis present

## 2023-12-31 DIAGNOSIS — Z66 Do not resuscitate: Secondary | ICD-10-CM | POA: Diagnosis present

## 2023-12-31 DIAGNOSIS — Z881 Allergy status to other antibiotic agents status: Secondary | ICD-10-CM

## 2023-12-31 DIAGNOSIS — E860 Dehydration: Secondary | ICD-10-CM | POA: Diagnosis present

## 2023-12-31 DIAGNOSIS — Z86718 Personal history of other venous thrombosis and embolism: Secondary | ICD-10-CM

## 2023-12-31 DIAGNOSIS — K5900 Constipation, unspecified: Secondary | ICD-10-CM | POA: Diagnosis present

## 2023-12-31 DIAGNOSIS — Z885 Allergy status to narcotic agent status: Secondary | ICD-10-CM

## 2023-12-31 DIAGNOSIS — Z681 Body mass index (BMI) 19 or less, adult: Secondary | ICD-10-CM

## 2023-12-31 LAB — COMPREHENSIVE METABOLIC PANEL
ALT: 21 U/L (ref 0–44)
AST: 22 U/L (ref 15–41)
Albumin: 4.3 g/dL (ref 3.5–5.0)
Alkaline Phosphatase: 98 U/L (ref 38–126)
Anion gap: 16 — ABNORMAL HIGH (ref 5–15)
BUN: 57 mg/dL — ABNORMAL HIGH (ref 8–23)
CO2: 19 mmol/L — ABNORMAL LOW (ref 22–32)
Calcium: 9.5 mg/dL (ref 8.9–10.3)
Chloride: 107 mmol/L (ref 98–111)
Creatinine, Ser: 2.43 mg/dL — ABNORMAL HIGH (ref 0.44–1.00)
GFR, Estimated: 18 mL/min — ABNORMAL LOW (ref >60)
Glucose, Bld: 82 mg/dL (ref 70–99)
Potassium: 5.2 mmol/L — ABNORMAL HIGH (ref 3.5–5.1)
Sodium: 142 mmol/L (ref 135–145)
Total Bilirubin: 1.3 mg/dL — ABNORMAL HIGH (ref 0.0–1.2)
Total Protein: 7.8 g/dL (ref 6.5–8.1)

## 2023-12-31 LAB — URINALYSIS, ROUTINE W REFLEX MICROSCOPIC
Bacteria, UA: NONE SEEN
Bilirubin Urine: NEGATIVE
Glucose, UA: NEGATIVE mg/dL
Ketones, ur: NEGATIVE mg/dL
Leukocytes,Ua: NEGATIVE
Nitrite: NEGATIVE
Protein, ur: 30 mg/dL — AB
Specific Gravity, Urine: 1.016 (ref 1.005–1.030)
pH: 5 (ref 5.0–8.0)

## 2023-12-31 LAB — CBC WITH DIFFERENTIAL/PLATELET
Abs Immature Granulocytes: 0.02 10*3/uL (ref 0.00–0.07)
Basophils Absolute: 0.1 10*3/uL (ref 0.0–0.1)
Basophils Relative: 1 %
Eosinophils Absolute: 0.1 10*3/uL (ref 0.0–0.5)
Eosinophils Relative: 1 %
HCT: 36.8 % (ref 36.0–46.0)
Hemoglobin: 11.8 g/dL — ABNORMAL LOW (ref 12.0–15.0)
Immature Granulocytes: 0 %
Lymphocytes Relative: 14 %
Lymphs Abs: 1 10*3/uL (ref 0.7–4.0)
MCH: 34.1 pg — ABNORMAL HIGH (ref 26.0–34.0)
MCHC: 32.1 g/dL (ref 30.0–36.0)
MCV: 106.4 fL — ABNORMAL HIGH (ref 80.0–100.0)
Monocytes Absolute: 0.6 10*3/uL (ref 0.1–1.0)
Monocytes Relative: 9 %
Neutro Abs: 5.1 10*3/uL (ref 1.7–7.7)
Neutrophils Relative %: 75 %
Platelets: 150 10*3/uL (ref 150–400)
RBC: 3.46 MIL/uL — ABNORMAL LOW (ref 3.87–5.11)
RDW: 13.1 % (ref 11.5–15.5)
WBC: 6.8 10*3/uL (ref 4.0–10.5)
nRBC: 0 % (ref 0.0–0.2)

## 2023-12-31 LAB — RESP PANEL BY RT-PCR (RSV, FLU A&B, COVID)  RVPGX2
Influenza A by PCR: NEGATIVE
Influenza B by PCR: NEGATIVE
Resp Syncytial Virus by PCR: NEGATIVE
SARS Coronavirus 2 by RT PCR: NEGATIVE

## 2023-12-31 LAB — LACTIC ACID, PLASMA: Lactic Acid, Venous: 1.4 mmol/L (ref 0.5–1.9)

## 2023-12-31 MED ORDER — PATIROMER SORBITEX CALCIUM 8.4 G PO PACK
8.4000 g | PACK | Freq: Every day | ORAL | Status: DC
  Administered 2024-01-01 – 2024-01-04 (×4): 8.4 g via ORAL
  Filled 2023-12-31 (×6): qty 1

## 2023-12-31 MED ORDER — FENTANYL CITRATE PF 50 MCG/ML IJ SOSY
50.0000 ug | PREFILLED_SYRINGE | Freq: Once | INTRAMUSCULAR | Status: AC
  Administered 2023-12-31: 50 ug via INTRAVENOUS
  Filled 2023-12-31: qty 1

## 2023-12-31 MED ORDER — ACETAMINOPHEN 325 MG PO TABS
650.0000 mg | ORAL_TABLET | Freq: Four times a day (QID) | ORAL | Status: DC | PRN
  Administered 2024-01-04: 650 mg via ORAL
  Filled 2023-12-31: qty 2

## 2023-12-31 MED ORDER — SODIUM CHLORIDE 0.9 % IV BOLUS
500.0000 mL | Freq: Once | INTRAVENOUS | Status: AC
  Administered 2023-12-31: 500 mL via INTRAVENOUS

## 2023-12-31 MED ORDER — HYDRALAZINE HCL 20 MG/ML IJ SOLN
5.0000 mg | INTRAMUSCULAR | Status: DC | PRN
  Administered 2024-01-01 (×2): 5 mg via INTRAVENOUS
  Filled 2023-12-31: qty 1

## 2023-12-31 MED ORDER — ONDANSETRON HCL 4 MG/2ML IJ SOLN: 4.0000 mg | Freq: Three times a day (TID) | INTRAMUSCULAR | Status: DC | PRN

## 2023-12-31 NOTE — ED Notes (Signed)
 Patient transported to CT

## 2023-12-31 NOTE — ED Provider Triage Note (Signed)
 Emergency Medicine Provider Triage Evaluation Note  Claire Kelley , a 89 y.o. female  was evaluated in triage.  Pt presents with daughter who is concerned about a UTI. She has presented similarly in the past with a UTI. Pt is A&O x2. No recent falls or changes to medication. Daughter did say that the patient soiled herself while in the waiting room because she couldn't wait. Urine has a strong odor. Daughter last talked to the patient last night, and she was at her normal baseline at that time. Hx of A-fib, on Eliquis .   Review of Systems  Positive:  Negative:   Physical Exam  BP (!) 165/87   Pulse 64   Temp 97.8 F (36.6 C) (Oral)   Resp 15   SpO2 95%  Gen:   Awake, no distress  A&O x2 Resp:  Normal effort. LCTAB. MSK:   Moves extremities without difficulty Other:  Grip strength equal bilaterally  Medical Decision Making  Medically screening exam initiated at 4:45 PM.  Appropriate orders placed.  Claire Kelley was informed that the remainder of the evaluation will be completed by another provider, this initial triage assessment does not replace that evaluation, and the importance of remaining in the ED until their evaluation is complete.    Claire Kelley, Jerrold Haskell A, PA-C 12/31/23 1651

## 2023-12-31 NOTE — ED Provider Notes (Signed)
 [facility] Provider Note    Event Date/Time   First MD Initiated Contact with Patient 12/31/23 1954     (approximate)   History   Confusion   HPI  Claire Kelley is a 89 y.o. female who presents to the emergency department today from her living facility because of concerns for confusion.  History is primarily obtained from family at bedside.  They state that the patient normally is oriented however has been confused today.  She has had this happen to her in the past when she has had urinary tract infections.  Daughter did notice some bad odor to the patient's urine earlier today.      Physical Exam   Triage Vital Signs: ED Triage Vitals  Encounter Vitals Group     BP 12/31/23 1638 (!) 165/87     Systolic BP Percentile --      Diastolic BP Percentile --      Pulse Rate 12/31/23 1638 64     Resp 12/31/23 1638 15     Temp 12/31/23 1638 97.8 F (36.6 C)     Temp Source 12/31/23 1638 Oral     SpO2 12/31/23 1638 95 %     Weight --      Height --      Head Circumference --      Peak Flow --      Pain Score 12/31/23 1642 0     Pain Loc --      Pain Education --      Exclude from Growth Chart --     Most recent vital signs: Vitals:   12/31/23 1638 12/31/23 1942  BP: (!) 165/87 (!) 171/80  Pulse: 64 62  Resp: 15 16  Temp: 97.8 F (36.6 C) 98 F (36.7 C)  SpO2: 95% 92%   General: Awake, alert, not completely oriented. CV:  Good peripheral perfusion. Regular rate and rhythm. Resp:  Normal effort. Lungs clear. Abd:  No distention. Non tender.   ED Results / Procedures / Treatments   Labs (all labs ordered are listed, but only abnormal results are displayed) Labs Reviewed  CBC WITH DIFFERENTIAL/PLATELET - Abnormal; Notable for the following components:      Result Value   RBC 3.46 (*)    Hemoglobin 11.8 (*)    MCV 106.4 (*)    MCH 34.1 (*)    All other components within normal limits  COMPREHENSIVE METABOLIC PANEL - Abnormal;  Notable for the following components:   Potassium 5.2 (*)    CO2 19 (*)    BUN 57 (*)    Creatinine, Ser 2.43 (*)    Total Bilirubin 1.3 (*)    GFR, Estimated 18 (*)    Anion gap 16 (*)    All other components within normal limits  RESP PANEL BY RT-PCR (RSV, FLU A&B, COVID)  [id number]  LACTIC ACID, PLASMA  URINALYSIS, ROUTINE W REFLEX MICROSCOPIC     EKG  None   RADIOLOGY I independently interpreted and visualized the ct head/cervical spine. My interpretation: No ICH. No free air. Radiology interpretation:  IMPRESSION:  1. No acute intracranial CT findings or interval changes. Chronic  changes.  2. Large hiatal hernia with intrathoracic stomach.  3. Bronchitis.  No focal infiltrate in the imaged lung bases.  4. Constipation and diverticulosis.  5. Aortobi-iliac stent graft with increased size of the excluded  aneurysm sac, today 10.3 x 7.9 cm, previously 8.5 x 6.4 cm. No overt  adjacent inflammatory  changes or evidence of adjacent hemorrhage.  Vascular surgery referral recommended.  6. Nonobstructive nephrolithiasis.  7. Cardiomegaly with aortic and coronary artery atherosclerosis.  Prior CABG.  8. Mild mesenteric congestive features and mild edema along the body  wall.  9. Appears to have had interval repair of the prior left inguinal  hernia. No incarcerated hernia.  10. Osteopenia and degenerative change.  11. Chronic sinus disease.    Aortic Atherosclerosis (ICD10-I70.0).      Critical Care performed: No   MEDICATIONS ORDERED IN ED: Medications - No data to display   IMPRESSION / MDM / ASSESSMENT AND PLAN / ED COURSE  I reviewed the triage vital signs and the nursing notes.                              Differential diagnosis includes, but is not limited to, UTI, ICH, anemia, electrolyte abnormality  Patient's presentation is most consistent with acute presentation with potential threat to life or bodily function.  Patient presented to the emergency  department today because of concerns for altered mental status.  Daughter at bedside states this has happened to her in the past when she has had UTI.  Blood work here without concerning leukocytosis.  Creatinine is elevated however in review of care everywhere this seems to be roughly patient's baseline.  UA without obvious signs of infection but will send for culture.  CT head without acute abnormality.  Did also obtain CT abdomen pelvis given the patient had complained of some abdominal pain to the daughter.  This did not show any acute abnormality.  Aneurysmal sac roughly the same size as seen on out side CT done a few months ago.  However given continued altered mental status we will discuss with hospitalist for admission.      FINAL CLINICAL IMPRESSION(S) / ED DIAGNOSES   Final diagnoses:  Altered mental status, unspecified altered mental status type  Weakness      Note:  This document was prepared using Dragon voice recognition software and may include unintentional dictation errors.    Floy Roberts, MD 12/31/23 2328

## 2023-12-31 NOTE — ED Triage Notes (Signed)
 Pt comes via EMS from [place] with c/o just not feeling good. Pt states she feels like she is going to die. Pt doesn't have any other complaints.   190/90 CBG 86 97.5-t 93% RA

## 2023-12-31 NOTE — H&P (Signed)
 History and Physical    Claire Kelley FMW:[id number] DOB: 03-06-1926 DOA: 12/31/2023  Referring MD/NP/PA:   PCP: Aida Darice Hila, MD   Patient coming from:  The patient is coming from ALF   Chief Complaint: Confusion, weakness, abdominal pain, constipation  HPI: Claire Kelley is a 89 y.o. female with medical history significant of COPD on prn O2, DVT, PAF on Eliquis ,  CAD involving bypass graft, ischemic cardiomyopathy, dCHF, AICD,  HTN, renal artery stenosis, HLD, anemia of chronic disease, AAA without rupture s/p EVAR, CKD-4, , cognitive impairment, who presents with confusion, weakness, abdominal pain, constipation.  Per her daughter at the bedside, patient has mild dementia.  At her normal baseline, patient is orientated to person and place, not orientated to time.  Today patient is more confused, she is not orientated to the place anymore.  Patient has generalized weakness. She moves all extremities.  Patient has been constipated for about a week.  She developed abdominal pain which is located in the right lower quadrant, initially severe, currently mild, radiating to the right groin area.  No nausea vomiting or diarrhea.  No fever or chills.  Patient does not have dysuria or burning on urination.  Data reviewed independently and ED Course: pt was found to have WBC 6.8, renal function close to baseline, potassium 5.2, negative urinalysis, lactic acid 1.4, temperature normal, blood pressure 171/86, 160/77, heart rate 49, 63, RR 16, oxygen saturation 92-95% on room air.  CT of head negative.  Patient is placed on telemetry bed for observation.  CT scan of head and abdomen: 1. No acute intracranial CT findings or interval changes. Chronic changes. 2. Large hiatal hernia with intrathoracic stomach. 3. Bronchitis.  No focal infiltrate in the imaged lung bases. 4. Constipation and diverticulosis. 5. Aortobi-iliac stent graft with increased size of the excluded aneurysm sac, today 10.3  x 7.9 cm, previously 8.5 x 6.4 cm. No overt adjacent inflammatory changes or evidence of adjacent hemorrhage. Vascular surgery referral recommended. 6. Nonobstructive nephrolithiasis. 7. Cardiomegaly with aortic and coronary artery atherosclerosis. Prior CABG. 8. Mild mesenteric congestive features and mild edema along the body wall. 9. Appears to have had interval repair of the prior left inguinal hernia. No incarcerated hernia. 10. Osteopenia and degenerative change. 11. Chronic sinus disease.   Aortic Atherosclerosis (ICD10-I70.0).     EKG: Not done in ED, will get one.      Review of Systems:   General: no fevers, chills, no body weight gain, has fatigue HEENT: no blurry vision, hearing changes or sore throat Respiratory: no dyspnea, coughing, wheezing CV: no chest pain, no palpitations GI: no nausea, vomiting, has abdominal pain and constipation GU: no dysuria, burning on urination, increased urinary frequency, hematuria  Ext: no leg edema Neuro: no unilateral weakness, numbness, or tingling, no vision change or hearing loss. Has confusion Skin: no rash, no skin tear. MSK: No muscle spasm, no deformity, no limitation of range of movement in spin Heme: No easy bruising.  Travel history: No recent long distant travel.   Allergy:  Allergies  Allergen Reactions   Bactrim [Sulfamethoxazole-Trimethoprim] Other (See Comments)    Reaction:  Unknown    Ciprocinonide [Fluocinolone] Other (See Comments)    Reaction:  Unknown    Ciprofloxacin Other (See Comments)   Codeine    Lipitor [Atorvastatin] Other (See Comments)    Reaction:  Leg cramps    Macrobid [Nitrofurantoin Monohyd Macro] Other (See Comments)    Reaction:  Unknown    Paxil [Paroxetine Hcl]  Other (See Comments)    Reaction:  Made pt loopy    Past Medical History:  Diagnosis Date   A-fib (HCC)    AICD (automatic cardioverter/defibrillator) present    Coronary artery disease    Diverticulosis    GERD  (gastroesophageal reflux disease)    GERD (gastroesophageal reflux disease)    High cholesterol    Hypertension    Renal disorder    CKD    Past Surgical History:  Procedure Laterality Date   ABDOMINAL AORTIC ANEURYSM REPAIR     CORONARY ARTERY BYPASS GRAFT     PACEMAKER PLACEMENT      Social History:  reports that she has quit smoking. She has never used smokeless tobacco. She reports that she does not drink alcohol and does not use drugs.  Family History:  Family History  Problem Relation Age of Onset   Heart disease Sister    Heart disease Brother      Prior to Admission medications   Medication Sig Start Date End Date Taking? Authorizing Provider  acetaminophen  (TYLENOL ) 650 MG CR tablet Take 1,300 mg by mouth every 8 (eight) hours as needed for pain.    [provider]  amiodarone  (PACERONE ) 200 MG tablet Take 0.5 tablets by mouth daily. 04/20/20   [provider]  Biotin 1000 MCG CHEW Chew 500 mg by mouth daily.    [provider]  cholecalciferol  (VITAMIN D ) 25 MCG tablet Take 1 tablet (1,000 Units total) by mouth daily. 10/16/21   Milissa Tod PARAS, MD  Cholecalciferol  (VITAMIN D3) 1000 UNITS CAPS Take 1 capsule by mouth daily.    [provider]  clobetasol cream (TEMOVATE) 0.05 % Apply 1 application topically daily as needed.    [provider]  Cranberry 500 MG CAPS Take 1 capsule by mouth daily.    [provider]  CRANBERRY-VITAMIN C  PO Take 1 capsule by mouth 2 (two) times daily. Patient not taking: No sig reported    [provider]  Cranberry-Vitamin C -Vitamin E 140-100-3 MG-MG-UNIT CAPS Take by mouth.    [provider]  donepezil  (ARICEPT ) 5 MG tablet Take by mouth. 08/16/18   [provider]  Iron-Vitamin C  100-250 MG TABS Take by mouth.    [provider]  magnesium  gluconate (MAGONATE) 500 MG tablet Take 250 mg by mouth 2 (two) times daily. Patient not taking: No sig  reported    [provider]  melatonin 1 MG TABS tablet Take 1 tablet by mouth at bedtime as needed.    [provider]  metoprolol  succinate (TOPROL -XL) 25 MG 24 hr tablet Take 0.5 tablets (12.5 mg total) by mouth daily. 10/16/21   Milissa Tod PARAS, MD  oxybutynin  (DITROPAN -XL) 5 MG 24 hr tablet Take 1 tablet by mouth at bedtime. 08/25/21   [provider]  pantoprazole  (PROTONIX ) 40 MG tablet Take 40 mg by mouth daily.    [provider]  prochlorperazine (COMPAZINE) 5 MG tablet Take 1 tablet by mouth daily as needed.    [provider]  rosuvastatin  (CRESTOR ) 5 MG tablet Take 5 mg by mouth daily.    [provider]  senna-docusate (SENOKOT-S) 8.6-50 MG tablet Take 1 tablet by mouth as needed.    [provider]  traMADol  (ULTRAM ) 50 MG tablet Take 1 tablet (50 mg total) by mouth every 6 (six) hours as needed for moderate pain (May increase to 2 tablets q 6 hrs PRN). 10/15/21   Milissa Tod PARAS, MD  vitamin B-12 (CYANOCOBALAMIN ) 100 MCG tablet Take 100 mcg by mouth daily.    [provider]    Physical Exam: Vitals:   12/31/23 2230 12/31/23 2300 12/31/23 2330 01/01/24 0000  BP: (!) 180/79 (!) 177/80 (!) 184/83 (!) 167/79  Pulse: (!) 53 (!) 53 64 (!) 59  Resp:   17   Temp:      TempSrc:      SpO2: 96% 97% 96% 98%   General: Not in acute distress.  Dry mucous membrane HEENT:       Eyes: PERRL, EOMI, no jaundice       ENT: No discharge from the ears and nose       Neck: No JVD, no bruit, no mass felt. Heme: No neck lymph node enlargement. Cardiac: S1/S2, RRR, No murmurs, No gallops or rubs. Respiratory: No rales, wheezing, rhonchi or rubs. GI: Soft, nondistended, has mild tenderness in the right lower quadrant, no rebound pain, no organomegaly, BS present. GU: No hematuria Ext: No pitting leg edema bilaterally. 1+DP/PT pulse bilaterally. Musculoskeletal: No joint deformities, No joint redness or warmth, no limitation  of ROM in spin. Skin: No rashes.  Neuro: Confused, knows her own name, recognize her daughter, not orientated to place and time.  Cranial nerves II-XII grossly intact, moves all extremities normally. Psych: Patient is not psychotic, no suicidal or hemocidal ideation.  Labs on Admission: I have personally reviewed following labs and imaging studies  CBC: Recent Labs  Lab 12/31/23 1645  WBC 6.8  NEUTROABS 5.1  HGB 11.8*  HCT 36.8  MCV 106.4*  PLT 150   Basic Metabolic Panel: Recent Labs  Lab 12/31/23 1645  NA 142  K 5.2*  CL 107  CO2 19*  GLUCOSE 82  BUN 57*  CREATININE 2.43*  CALCIUM  9.5   GFR: CrCl cannot be calculated (Unknown ideal weight.). Liver Function Tests: Recent Labs  Lab 12/31/23 1645  AST 22  ALT 21  ALKPHOS 98  BILITOT 1.3*  PROT 7.8  ALBUMIN 4.3   No results for input(s): LIPASE, AMYLASE in the last 168 hours. No results for input(s): AMMONIA in the last 168 hours. Coagulation Profile: No results for input(s): INR, PROTIME in the last 168 hours. Cardiac Enzymes: No results for input(s): CKTOTAL, CKMB, CKMBINDEX, TROPONINI in the last 168 hours. BNP (last 3 results) No results for input(s): PROBNP in the last 8760 hours. HbA1C: No results for input(s): HGBA1C in the last 72 hours. CBG: No results for input(s): GLUCAP in the last 168 hours. Lipid Profile: No results for input(s): CHOL, HDL, LDLCALC, TRIG, CHOLHDL, LDLDIRECT in the last 72 hours. Thyroid Function Tests: No results for input(s): TSH, T4TOTAL, FREET4, T3FREE, THYROIDAB in the last 72 hours. Anemia Panel: No results for input(s): VITAMINB12, FOLATE, FERRITIN, TIBC, IRON, RETICCTPCT in the last 72 hours. Urine analysis:    Component Value Date/Time   COLORURINE YELLOW (A) 12/31/2023 1649   APPEARANCEUR CLEAR (A) 12/31/2023 1649   LABSPEC 1.016 12/31/2023 1649   PHURINE 5.0 12/31/2023 1649   GLUCOSEU NEGATIVE  12/31/2023 1649   HGBUR SMALL (A) 12/31/2023 1649   BILIRUBINUR NEGATIVE 12/31/2023 1649   BILIRUBINUR negative 05/23/2023 1657   KETONESUR NEGATIVE 12/31/2023 1649   PROTEINUR 30 (A) 12/31/2023 1649   UROBILINOGEN 0.2 05/23/2023 1657   NITRITE NEGATIVE 12/31/2023 1649   LEUKOCYTESUR NEGATIVE 12/31/2023 1649   Sepsis Labs: @LABRCNTIP (procalcitonin:4,lacticidven:4) ) Recent Results (from the past 240 hours)  Resp panel by RT-PCR (RSV, Flu A&B, Covid) Anterior Nasal Swab  Status: None   Collection Time: 12/31/23  7:42 PM   Specimen: Anterior Nasal Swab  Result Value Ref Range Status   SARS Coronavirus 2 by RT PCR NEGATIVE NEGATIVE Final    Comment: (NOTE) SARS-CoV-2 target nucleic acids are NOT DETECTED.  The SARS-CoV-2 RNA is generally detectable in upper respiratory specimens during the acute phase of infection. The lowest concentration of SARS-CoV-2 viral copies this assay can detect is 138 copies/mL. A negative result does not preclude SARS-Cov-2 infection and should not be used as the sole basis for treatment or other patient management decisions. A negative result may occur with  improper specimen collection/handling, submission of specimen other than nasopharyngeal swab, presence of viral mutation(s) within the areas targeted by this assay, and inadequate number of viral copies(<138 copies/mL). A negative result must be combined with clinical observations, patient history, and epidemiological information. The expected result is Negative.  Fact Sheet for Patients:  bloggercourse.com  Fact Sheet for Healthcare Providers:  seriousbroker.it  This test is no t yet approved or cleared by the [place]  FDA and  has been authorized for detection and/or diagnosis of SARS-CoV-2 by FDA under an Emergency Use Authorization (EUA). This EUA will remain  in effect (meaning this test can be used) for the duration of  the COVID-19 declaration under Section 564(b)(1) of the Act, 21 U.S.C.section 360bbb-3(b)(1), unless the authorization is terminated  or revoked sooner.       Influenza A by PCR NEGATIVE NEGATIVE Final   Influenza B by PCR NEGATIVE NEGATIVE Final    Comment: (NOTE) The Xpert Xpress SARS-CoV-2/FLU/RSV plus assay is intended as an aid in the diagnosis of influenza from Nasopharyngeal swab specimens and should not be used as a sole basis for treatment. Nasal washings and aspirates are unacceptable for Xpert Xpress SARS-CoV-2/FLU/RSV testing.  Fact Sheet for Patients: bloggercourse.com  Fact Sheet for Healthcare Providers: seriousbroker.it  This test is not yet approved or cleared by the [place]  FDA and has been authorized for detection and/or diagnosis of SARS-CoV-2 by FDA under an Emergency Use Authorization (EUA). This EUA will remain in effect (meaning this test can be used) for the duration of the COVID-19 declaration under Section 564(b)(1) of the Act, 21 U.S.C. section 360bbb-3(b)(1), unless the authorization is terminated or revoked.     Resp Syncytial Virus by PCR NEGATIVE NEGATIVE Final    Comment: (NOTE) Fact Sheet for Patients: bloggercourse.com  Fact Sheet for Healthcare Providers: seriousbroker.it  This test is not yet approved or cleared by the [place]  FDA and has been authorized for detection and/or diagnosis of SARS-CoV-2 by FDA under an Emergency Use Authorization (EUA). This EUA will remain in effect (meaning this test can be used) for the duration of the COVID-19 declaration under Section 564(b)(1) of the Act, 21 U.S.C. section 360bbb-3(b)(1), unless the authorization is terminated or revoked.  Performed at [facility], [address], [place], [place] [address]      Radiological Exams on Admission:   Assessment/Plan Principal  Problem:   Hyperkalemia Active Problems:   Constipation   Abdominal pain   AAA (abdominal aortic aneurysm) (HCC)   Hypertension   Coronary artery disease   Atrial fibrillation, chronic (HCC)   DVT (deep venous thrombosis) (HCC)   Chronic diastolic CHF (congestive heart failure) (HCC)   Acute metabolic encephalopathy   CKD (chronic kidney disease), stage IV (HCC)   Hiatal hernia   Assessment and Plan:   Hyperkalemia: K 5.2. likely due to dehydration in the  setting of CKD-4 -Please telemetry bed for observation  -IV fluid: 500 cc normal saline -8.4 g patiromer   Constipation -Start MiraLAX  and senokot  Abdominal pain: Possibly due to constipation. -As needed Tylenol  and as needed morphine  for severe pain -Check lipase -Laxative as above  History of AAA (abdominal aortic aneurysm) [facility]): S/p of EVAR. Our radiologist reported enlargement of aneurysm with zide of 10.3 x 9.7 cm, however recent CT scan done at the [facility] on 10/12/2023 showed size of 7.9 x 9.9 cm. The size seems to be similar.  Anyway patient will never consider another surgical treatment or any procedure per her daughter, Therefore will not not consult VVS. -Observe closely  Hypertension -Metoprolol  -IV hydralazine  as needed  Coronary artery disease: No chest pain -Patient stopped taking Crestor  per her daughter -Observe closely  Atrial fibrillation, chronic (HCC) -Metoprolol  -Amiodarone  -Eliquis   DVT (deep venous thrombosis) (HCC) -Eliquis   Chronic diastolic CHF (congestive heart failure) (HCC): 2D echo on 04/26/2023 showed EF > 50% with grade 3 diastolic dysfunction.  Patient does not have leg edema JVD.  CHF seem to be compensated. -Check BNP  Acute metabolic encephalopathy: Etiology is not clear, may be due to delirium and pain.  No focal neurodeficit on physical examination.  CT head negative. -Frequent neurocheck -Fall precaution  CKD (chronic kidney disease), stage IV [facility]): Renal function close  to baseline.  Recent baseline creatinine 2.3 on 10/17/2023.  Her creatinine is at 2.43, BUN 57, GFR 18. -Follow-up by BMP  Hiatal hernia: Patient has a large hiatal hernia -Protonix     DVT ppx: on Eliquis   Code Status: DNR per her daughter  Family Communication:  Yes, patient's daughter   at bed side.      Disposition Plan:  Anticipate discharge back to previous environment  Consults called:  none  Admission status and Level of care: Telemetry Medical:    for obs     Dispo: The patient is from: ALF              Anticipated d/c is to: ALF              Anticipated d/c date is: 1 day              Patient currently is not medically stable to d/c.    Severity of Illness:  The appropriate patient status for this patient is OBSERVATION. Observation status is judged to be reasonable and necessary in order to provide the required intensity of service to ensure the patient's safety. The patient's presenting symptoms, physical exam findings, and initial radiographic and laboratory data in the context of their medical condition is felt to place them at decreased risk for further clinical deterioration. Furthermore, it is anticipated that the patient will be medically stable for discharge from the hospital within 2 midnights of admission.        Date of Service 01/01/2024    Caleb Exon [facility]   If 7PM-7AM, please contact night-coverage [email or web address] 01/01/2024, 12:47 AM

## 2023-12-31 NOTE — ED Triage Notes (Signed)
 Daughter present with pt from [facility] who says they said she wasn't acting normally. Daughter says she gets like this when she has a UTI. Pt can't recall her birthday in its entirety nor what city/state she is in. Daughter says she can normally recall this information. At baseline she can ambulate with a rolling walker, but feels too weak to stand today.

## 2024-01-01 DIAGNOSIS — E860 Dehydration: Secondary | ICD-10-CM | POA: Diagnosis present

## 2024-01-01 DIAGNOSIS — G9341 Metabolic encephalopathy: Secondary | ICD-10-CM | POA: Diagnosis present

## 2024-01-01 DIAGNOSIS — E8721 Acute metabolic acidosis: Secondary | ICD-10-CM | POA: Diagnosis present

## 2024-01-01 DIAGNOSIS — I82409 Acute embolism and thrombosis of unspecified deep veins of unspecified lower extremity: Secondary | ICD-10-CM | POA: Diagnosis present

## 2024-01-01 DIAGNOSIS — D631 Anemia in chronic kidney disease: Secondary | ICD-10-CM | POA: Diagnosis present

## 2024-01-01 DIAGNOSIS — K5900 Constipation, unspecified: Secondary | ICD-10-CM | POA: Diagnosis present

## 2024-01-01 DIAGNOSIS — I130 Hypertensive heart and chronic kidney disease with heart failure and stage 1 through stage 4 chronic kidney disease, or unspecified chronic kidney disease: Secondary | ICD-10-CM | POA: Diagnosis present

## 2024-01-01 DIAGNOSIS — Z87891 Personal history of nicotine dependence: Secondary | ICD-10-CM | POA: Diagnosis not present

## 2024-01-01 DIAGNOSIS — Z66 Do not resuscitate: Secondary | ICD-10-CM | POA: Diagnosis present

## 2024-01-01 DIAGNOSIS — Z1152 Encounter for screening for COVID-19: Secondary | ICD-10-CM | POA: Diagnosis not present

## 2024-01-01 DIAGNOSIS — N184 Chronic kidney disease, stage 4 (severe): Secondary | ICD-10-CM | POA: Diagnosis present

## 2024-01-01 DIAGNOSIS — I5032 Chronic diastolic (congestive) heart failure: Secondary | ICD-10-CM | POA: Diagnosis present

## 2024-01-01 DIAGNOSIS — Z681 Body mass index (BMI) 19 or less, adult: Secondary | ICD-10-CM | POA: Diagnosis not present

## 2024-01-01 DIAGNOSIS — Z7901 Long term (current) use of anticoagulants: Secondary | ICD-10-CM | POA: Diagnosis not present

## 2024-01-01 DIAGNOSIS — I25810 Atherosclerosis of coronary artery bypass graft(s) without angina pectoris: Secondary | ICD-10-CM | POA: Diagnosis present

## 2024-01-01 DIAGNOSIS — I714 Abdominal aortic aneurysm, without rupture, unspecified: Secondary | ICD-10-CM | POA: Diagnosis present

## 2024-01-01 DIAGNOSIS — I480 Paroxysmal atrial fibrillation: Secondary | ICD-10-CM | POA: Diagnosis present

## 2024-01-01 DIAGNOSIS — E7800 Pure hypercholesterolemia, unspecified: Secondary | ICD-10-CM | POA: Diagnosis present

## 2024-01-01 DIAGNOSIS — Z95810 Presence of automatic (implantable) cardiac defibrillator: Secondary | ICD-10-CM | POA: Diagnosis not present

## 2024-01-01 DIAGNOSIS — E875 Hyperkalemia: Secondary | ICD-10-CM | POA: Diagnosis present

## 2024-01-01 DIAGNOSIS — J4489 Other specified chronic obstructive pulmonary disease: Secondary | ICD-10-CM | POA: Diagnosis present

## 2024-01-01 DIAGNOSIS — I2510 Atherosclerotic heart disease of native coronary artery without angina pectoris: Secondary | ICD-10-CM | POA: Diagnosis present

## 2024-01-01 DIAGNOSIS — Z8249 Family history of ischemic heart disease and other diseases of the circulatory system: Secondary | ICD-10-CM | POA: Diagnosis not present

## 2024-01-01 DIAGNOSIS — I482 Chronic atrial fibrillation, unspecified: Secondary | ICD-10-CM | POA: Diagnosis present

## 2024-01-01 DIAGNOSIS — N179 Acute kidney failure, unspecified: Secondary | ICD-10-CM | POA: Diagnosis present

## 2024-01-01 DIAGNOSIS — I255 Ischemic cardiomyopathy: Secondary | ICD-10-CM | POA: Diagnosis present

## 2024-01-01 LAB — CBC
HCT: 33.4 % — ABNORMAL LOW (ref 36.0–46.0)
Hemoglobin: 11 g/dL — ABNORMAL LOW (ref 12.0–15.0)
MCH: 33.7 pg (ref 26.0–34.0)
MCHC: 32.9 g/dL (ref 30.0–36.0)
MCV: 102.5 fL — ABNORMAL HIGH (ref 80.0–100.0)
Platelets: 144 10*3/uL — ABNORMAL LOW (ref 150–400)
RBC: 3.26 MIL/uL — ABNORMAL LOW (ref 3.87–5.11)
RDW: 13 % (ref 11.5–15.5)
WBC: 8.1 10*3/uL (ref 4.0–10.5)
nRBC: 0 % (ref 0.0–0.2)

## 2024-01-01 LAB — BASIC METABOLIC PANEL
Anion gap: 12 (ref 5–15)
Anion gap: 12 (ref 5–15)
BUN: 51 mg/dL — ABNORMAL HIGH (ref 8–23)
BUN: 45 mg/dL — ABNORMAL HIGH (ref 8–23)
CO2: 21 mmol/L — ABNORMAL LOW (ref 22–32)
CO2: 20 mmol/L — ABNORMAL LOW (ref 22–32)
Calcium: 9.1 mg/dL (ref 8.9–10.3)
Calcium: 8.9 mg/dL (ref 8.9–10.3)
Chloride: 107 mmol/L (ref 98–111)
Chloride: 107 mmol/L (ref 98–111)
Creatinine, Ser: 2 mg/dL — ABNORMAL HIGH (ref 0.44–1.00)
Creatinine, Ser: 1.91 mg/dL — ABNORMAL HIGH (ref 0.44–1.00)
GFR, Estimated: 23 mL/min — ABNORMAL LOW (ref >60)
GFR, Estimated: 24 mL/min — ABNORMAL LOW (ref >60)
Glucose, Bld: 103 mg/dL — ABNORMAL HIGH (ref 70–99)
Glucose, Bld: 122 mg/dL — ABNORMAL HIGH (ref 70–99)
Potassium: 4.9 mmol/L (ref 3.5–5.1)
Potassium: 5.8 mmol/L — ABNORMAL HIGH (ref 3.5–5.1)
Sodium: 140 mmol/L (ref 135–145)
Sodium: 139 mmol/L (ref 135–145)

## 2024-01-01 LAB — BRAIN NATRIURETIC PEPTIDE: B Natriuretic Peptide: 565.1 pg/mL — ABNORMAL HIGH (ref 0.0–100.0)

## 2024-01-01 MED ORDER — SODIUM CHLORIDE 0.9 % IV BOLUS
1000.0000 mL | Freq: Once | INTRAVENOUS | Status: AC
  Administered 2024-01-01: 1000 mL via INTRAVENOUS

## 2024-01-01 MED ORDER — PANTOPRAZOLE SODIUM 40 MG PO TBEC
40.0000 mg | DELAYED_RELEASE_TABLET | Freq: Every day | ORAL | Status: DC
  Administered 2024-01-01 – 2024-01-05 (×5): 40 mg via ORAL
  Filled 2024-01-01 (×5): qty 1

## 2024-01-01 MED ORDER — MORPHINE SULFATE (PF) 2 MG/ML IV SOLN
0.5000 mg | INTRAVENOUS | Status: DC | PRN
  Administered 2024-01-01: 0.5 mg via INTRAVENOUS
  Filled 2024-01-01: qty 1

## 2024-01-01 MED ORDER — SENNOSIDES-DOCUSATE SODIUM 8.6-50 MG PO TABS
1.0000 | ORAL_TABLET | Freq: Two times a day (BID) | ORAL | Status: DC
Start: 2024-01-01 — End: 2024-01-05
  Administered 2024-01-01 – 2024-01-05 (×9): 1 via ORAL
  Filled 2024-01-01 (×9): qty 1

## 2024-01-01 MED ORDER — AMIODARONE HCL 200 MG PO TABS
100.0000 mg | ORAL_TABLET | Freq: Every day | ORAL | Status: DC
Start: 2024-01-01 — End: 2024-01-05
  Administered 2024-01-01 – 2024-01-05 (×5): 100 mg via ORAL
  Filled 2024-01-01 (×5): qty 1

## 2024-01-01 MED ORDER — MELATONIN 5 MG PO TABS: 2.5000 mg | ORAL_TABLET | Freq: Every evening | ORAL | Status: DC | PRN

## 2024-01-01 MED ORDER — METOPROLOL SUCCINATE ER 25 MG PO TB24
25.0000 mg | ORAL_TABLET | Freq: Every day | ORAL | Status: DC
Start: 2024-01-01 — End: 2024-01-05
  Administered 2024-01-01 – 2024-01-05 (×5): 25 mg via ORAL
  Filled 2024-01-01 (×5): qty 1

## 2024-01-01 MED ORDER — APIXABAN 2.5 MG PO TABS
2.5000 mg | ORAL_TABLET | Freq: Two times a day (BID) | ORAL | Status: DC
  Administered 2024-01-01 – 2024-01-05 (×9): 2.5 mg via ORAL
  Filled 2024-01-01 (×10): qty 1

## 2024-01-01 MED ORDER — POLYETHYLENE GLYCOL 3350 17 G PO PACK
17.0000 g | PACK | Freq: Every day | ORAL | Status: DC
  Administered 2024-01-01 – 2024-01-05 (×4): 17 g via ORAL
  Filled 2024-01-01 (×5): qty 1

## 2024-01-01 NOTE — Progress Notes (Signed)
 Progress Note   Patient: Claire Kelley FMW:[id number] DOB: 03-07-1926 DOA: 12/31/2023     0 DOS: the patient was seen and examined on 01/01/2024   Brief hospital course: Claire Kelley is a 89 y.o. female with medical history significant of COPD on prn O2, DVT, PAF on Eliquis ,  CAD involving bypass graft, ischemic cardiomyopathy, dCHF, AICD,  HTN, renal artery stenosis, HLD, anemia of chronic disease, AAA without rupture s/p EVAR, CKD-4, , cognitive impairment, who presents with confusion, weakness, abdominal pain, constipation.  Assessment and Plan:    Hyperkalemia: K 5.2.  On presentation Potassium level improved -IV fluid: 500 cc normal saline - continue patiromer    Constipation Continue MiraLAX  and senokot   Abdominal pain: Possibly due to constipation. CT scan of the abdomen IV fluids-showed findings of constipation as well as diverticulosis with no evidence of diverticulitis   History of AAA (abdominal aortic aneurysm) [facility]): S/p of EVAR. Our radiologist reported enlargement of aneurysm with zide of 10.3 x 9.7 cm, however recent CT scan done at the [facility] on 10/12/2023 showed size of 7.9 x 9.9 cm. The size seems to be similar.  Anyway patient will never consider another surgical treatment or any procedure per her daughter, Therefore will not not consult VVS. We will continue to observe closely   Hypertension -Metoprolol  -IV hydralazine  as needed   Coronary artery disease: No chest pain -Patient stopped taking Crestor  per her daughter -Observe closely   Atrial fibrillation, chronic (HCC) -Metoprolol  -Amiodarone  -Eliquis    DVT (deep venous thrombosis) (HCC) -Eliquis    Chronic diastolic CHF (congestive heart failure) (HCC): 2D echo on 04/26/2023 showed EF > 50% with grade 3 diastolic dysfunction.  Patient does not have leg edema JVD.  CHF seem to be compensated. -Check BNP   Acute metabolic encephalopathy: Etiology is not clear, may be due to delirium and pain.  No focal  neurodeficit on physical examination.  CT head negative. -Frequent neurocheck -Fall precaution   AKI on CKD (chronic kidney disease), stage IV (HCC): Renal function close to baseline.  Recent baseline creatinine 1.4.  Her creatinine is at 2.43, BUN 57, GFR 18. -Continue IV fluid Monitor renal function closely   Hiatal hernia: Patient has a large hiatal hernia -Protonix        DVT ppx: on Eliquis    Code Status: DNR per her daughter   Family Communication:  Yes, patient's daughter   at bed side.       Disposition Plan:  Anticipate discharge back to previous environment   Consults called:  none   Admission status and Level of care: Telemetry Medical:    for obs      Subjective:  Patient seen and examined at bedside this morning Had some abdominal pain requiring IV morphine  Here to have bowel movement Mental status improving Denies chest pain nausea vomiting  Physical Exam:  General: Not in acute distress.  Chronically dehydrated Heme: No neck lymph node enlargement. Cardiac: S1/S2, RRR, No murmurs, No gallops or rubs. Respiratory: No rales, wheezing, rhonchi or rubs. GI: Mildly tender right lower quadrant GU: No hematuria Ext: No pitting leg edema bilaterally. 1+DP/PT pulse bilaterally. Musculoskeletal: No joint deformities, No joint redness or warmth, no limitation of ROM in spin. Skin: No rashes.  Neuro: Mental status improving, moving all extremities Psych: Patient is not psychotic, no suicidal or hemocidal ideation.   Vitals:   01/01/24 1247 01/01/24 1248 01/01/24 1249 01/01/24 1400  BP: 134/86   117/71  Pulse:   79 71  Resp:   ROLLEN)  24 20  Temp:  98 F (36.7 C)    TempSrc:  Oral    SpO2:   97% 97%    Data Reviewed: I have reviewed patient's CT scan of the abdomen showing above findings    Latest Ref Rng & Units 01/01/2024    6:00 AM 12/31/2023    4:45 PM 10/15/2021    6:28 AM  CMP  Glucose 70 - 99 mg/dL 896  82  90   BUN 8 - 23 mg/dL 51  57  15    Creatinine 0.44 - 1.00 mg/dL 7.99  7.56  8.60   Sodium 135 - 145 mmol/L 140  142  137   Potassium 3.5 - 5.1 mmol/L 4.9  5.2  4.1   Chloride 98 - 111 mmol/L 107  107  104   CO2 22 - 32 mmol/L 21  19  27    Calcium  8.9 - 10.3 mg/dL 9.1  9.5  8.5   Total Protein 6.5 - 8.1 g/dL  7.8  5.6   Total Bilirubin 0.0 - 1.2 mg/dL  1.3  0.8   Alkaline Phos 38 - 126 U/L  98  85   AST 15 - 41 U/L  22  16   ALT 0 - 44 U/L  21  14        Latest Ref Rng & Units 01/01/2024    6:00 AM 12/31/2023    4:45 PM 10/15/2021    6:28 AM  CBC  WBC 4.0 - 10.5 K/uL 8.1  6.8  7.2   Hemoglobin 12.0 - 15.0 g/dL 88.9  88.1  9.1   Hematocrit 36.0 - 46.0 % 33.4  36.8  27.6   Platelets 150 - 400 K/uL 144  150  154     Time spent: 56 minutes  Author: Drue ONEIDA Potter, MD 01/01/2024 4:34 PM  For on call review [email or web address].

## 2024-01-01 NOTE — ED Notes (Signed)
 Pt assisted with breakfast. Hand tremors present but able to grasp applesauce and feed self after set up. Pt refused other breakfast items after eating applesauce cup.

## 2024-01-01 NOTE — ED Notes (Signed)
 Pt daughter at bedside with food. Pt able to eat egg sandwich.

## 2024-01-01 NOTE — ED Notes (Signed)
 Patient's daughter, [name], called and notified of patient's new room number and provided with update.

## 2024-01-01 NOTE — ED Notes (Signed)
 This Clinical research associate assisted patient to the [facility] and back into bed. Pt is repositioned and call bell within reach.

## 2024-01-01 NOTE — ED Notes (Addendum)
 Pt assisted to [facility] by this RN and Mykia NT. Pt tolerated well and urinated.

## 2024-01-01 NOTE — ED Notes (Signed)
 Pt ate a couple grapes from lunch tray and refused the rest.

## 2024-01-01 NOTE — ED Notes (Signed)
 CCMD called this RN saying HR went to high 140s. Pt currently denying CP, flutters, lightheadedness, or H/A. Pt having PVCs and occasional runs of PVCs. HR ranging from 80-120s. MD made aware.

## 2024-01-02 ENCOUNTER — Encounter: Payer: Self-pay | Admitting: Internal Medicine

## 2024-01-02 DIAGNOSIS — E875 Hyperkalemia: Secondary | ICD-10-CM | POA: Diagnosis not present

## 2024-01-02 LAB — CBC WITH DIFFERENTIAL/PLATELET
Abs Immature Granulocytes: 0.03 10*3/uL (ref 0.00–0.07)
Basophils Absolute: 0 10*3/uL (ref 0.0–0.1)
Basophils Relative: 0 %
Eosinophils Absolute: 0.1 10*3/uL (ref 0.0–0.5)
Eosinophils Relative: 2 %
HCT: 35.1 % — ABNORMAL LOW (ref 36.0–46.0)
Hemoglobin: 11.5 g/dL — ABNORMAL LOW (ref 12.0–15.0)
Immature Granulocytes: 0 %
Lymphocytes Relative: 13 %
Lymphs Abs: 1 10*3/uL (ref 0.7–4.0)
MCH: 34.2 pg — ABNORMAL HIGH (ref 26.0–34.0)
MCHC: 32.8 g/dL (ref 30.0–36.0)
MCV: 104.5 fL — ABNORMAL HIGH (ref 80.0–100.0)
Monocytes Absolute: 0.7 10*3/uL (ref 0.1–1.0)
Monocytes Relative: 9 %
Neutro Abs: 5.9 10*3/uL (ref 1.7–7.7)
Neutrophils Relative %: 76 %
Platelets: 146 10*3/uL — ABNORMAL LOW (ref 150–400)
RBC: 3.36 MIL/uL — ABNORMAL LOW (ref 3.87–5.11)
RDW: 13 % (ref 11.5–15.5)
WBC: 7.9 10*3/uL (ref 4.0–10.5)
nRBC: 0 % (ref 0.0–0.2)

## 2024-01-02 LAB — BASIC METABOLIC PANEL
Anion gap: 9 (ref 5–15)
BUN: 39 mg/dL — ABNORMAL HIGH (ref 8–23)
CO2: 20 mmol/L — ABNORMAL LOW (ref 22–32)
Calcium: 9 mg/dL (ref 8.9–10.3)
Chloride: 111 mmol/L (ref 98–111)
Creatinine, Ser: 1.81 mg/dL — ABNORMAL HIGH (ref 0.44–1.00)
GFR, Estimated: 25 mL/min — ABNORMAL LOW (ref >60)
Glucose, Bld: 99 mg/dL (ref 70–99)
Potassium: 5 mmol/L (ref 3.5–5.1)
Sodium: 140 mmol/L (ref 135–145)

## 2024-01-02 LAB — URINE CULTURE

## 2024-01-02 NOTE — Progress Notes (Signed)
 Physical Therapy Evaluation Patient Details Name: Claire Kelley MRN: [id number] DOB: 1926-04-17 Today's Date: 01/02/2024  History of Present Illness  89 y.o. female who presents with confusion, weakness, abdominal pain, constipation. Admitting dx: hyperkalemia.  PMHx: COPD on prn O2, DVT, PAF on Eliquis ,  CAD involving bypass graft, ischemic cardiomyopathy, dCHF, AICD,  HTN, renal artery stenosis, HLD, anemia of chronic disease, AAA without rupture s/p EVAR, CKD-4, cognitive impairment.   Clinical Impression  Patient supine bed upon arrival, agreeable to PT/OT co-evaluation. Per patient and daughter reports, patient lives at [facility] in [place], and receives assistance for meals and med management. Patient was ambulating Mod I with use of Rollator and Mod I with ADLs. Denies recent fall history.   On evaluation, patient require supervision with bed mobility, and CGA with STS transfers and ambulation with use of RW. Able to tolerate ~50 ft to <> from toilet, CGA due to impaired balance with patient having notable quick turns, movements. Patient's vitals stable throughout session with activity. Patient presents at low to moderate fall risk. Patient will benefit from skilled acute PT services to address functional impairments (see below for additional) and maximize functional mobility. Anticipate the need for follow up PT services upon acute hospital discharge. Will continue to follow acutely.        If plan is discharge home, recommend the following: A little help with walking and/or transfers;Assist for transportation;Help with stairs or ramp for entrance   Can travel by private vehicle        Equipment Recommendations None recommended by PT  Recommendations for Other Services       Functional Status Assessment Patient has had a recent decline in their functional status and demonstrates the ability to make significant improvements in function in a reasonable and predictable amount of time.      Precautions / Restrictions Precautions Precautions: Fall Restrictions Weight Bearing Restrictions Per Provider Order: No      Mobility  Bed Mobility Overal bed mobility: Needs Assistance Bed Mobility: Supine to Sit     Supine to sit: Supervision, HOB elevated, Used rails          Transfers Overall transfer level: Needs assistance Equipment used: Rolling walker (2 wheels) Transfers: Sit to/from Stand Sit to Stand: Contact guard assist, +2 safety/equipment           General transfer comment: CGA for STS from bed; cues for hand placement. One instance of Min A due to imbalance.    Ambulation/Gait Ambulation/Gait assistance: Contact guard assist Gait Distance (Feet): 50 Feet Assistive device: Rolling walker (2 wheels)         General Gait Details: Able to ambulate x 50 ft from bed to toilet with RW (use rollator at baseline)  with CGA, patient overall moves quickly. Mild imbalance, patient overall moves quick requiring CGA for safety.  Stairs            Wheelchair Mobility     Tilt Bed    Modified Rankin (Stroke Patients Only)       Balance Overall balance assessment: History of Falls, Needs assistance Sitting-balance support: Feet supported Sitting balance-Leahy Scale: Good     Standing balance support: Reliant on assistive device for balance, During functional activity, Bilateral upper extremity supported Standing balance-Leahy Scale: Fair Standing balance comment: increased reliance on AD for balance; CGA for steadying intermittent.  Pertinent Vitals/Pain Pain Assessment Pain Assessment: No/denies pain    Home Living Family/patient expects to be discharged to:: Assisted living                 Home Equipment: Rollator (4 wheels);Shower seat - built in;BSC/3in1      Prior Function Prior Level of Function : Independent/Modified Independent             Mobility Comments: Ambulates  with Rollator at ALF; Daughter reports no falls. ADLs Comments: per daughter pt IND/MOD I with ADL performance at facility; showers 2x/wk and sponge bathes other days; ALF staff provides meal and meds; dtr reports some trouble with toilet transfers     Extremity/Trunk Assessment   Upper Extremity Assessment Upper Extremity Assessment: Generalized weakness;Defer to OT evaluation    Lower Extremity Assessment Lower Extremity Assessment: Generalized weakness       Communication   Communication Communication: No apparent difficulties Cueing Techniques: Verbal cues  Cognition Arousal: Alert Behavior During Therapy: WFL for tasks assessed/performed Overall Cognitive Status: History of cognitive impairments - at baseline                                 General Comments: alert and O to person; place        General Comments General comments (skin integrity, edema, etc.): IV bleeding-removed by NT during session    Exercises Other Exercises Other Exercises: PT educating daughter/patient on role of PT in acute setting; continued use of AD for balance.   Assessment/Plan    PT Assessment Patient needs continued PT services  PT Problem List Decreased strength;Decreased activity tolerance;Decreased balance;Decreased mobility       PT Treatment Interventions DME instruction;Gait training;Stair training;Functional mobility training;Therapeutic activities;Therapeutic exercise;Balance training    PT Goals (Current goals can be found in the Care Plan section)  Acute Rehab PT Goals Patient Stated Goal: Get Home PT Goal Formulation: With patient Time For Goal Achievement: 01/16/24 Potential to Achieve Goals: Good    Frequency Min 1X/week     Co-evaluation   Reason for Co-Treatment: For patient/therapist safety;Necessary to address cognition/behavior during functional activity PT goals addressed during session: Mobility/safety with mobility OT goals addressed during  session: ADL's and self-care       AM-PAC PT 6 Clicks Mobility  Outcome Measure Help needed turning from your back to your side while in a flat bed without using bedrails?: None Help needed moving from lying on your back to sitting on the side of a flat bed without using bedrails?: None Help needed moving to and from a bed to a chair (including a wheelchair)?: A Little Help needed standing up from a chair using your arms (e.g., wheelchair or bedside chair)?: A Little Help needed to walk in hospital room?: A Little Help needed climbing 3-5 steps with a railing? : A Little 6 Click Score: 20    End of Session Equipment Utilized During Treatment: Gait belt Activity Tolerance: Patient tolerated treatment well Patient left: in chair;with chair alarm set Nurse Communication: Mobility status PT Visit Diagnosis: Unsteadiness on feet (R26.81);Muscle weakness (generalized) (M62.81);History of falling (Z91.81);Other abnormalities of gait and mobility (R26.89)    Time: 8885-8862 PT Time Calculation (min) (ACUTE ONLY): 23 min   Charges:   PT Evaluation $PT Eval Low Complexity: 1 Low PT Treatments $Gait Training: 8-22 mins PT General Charges $$ ACUTE PT VISIT: 1 Visit  Olamide Lahaie M Fairly, PT, DPT 01/02/24 12:25 PM

## 2024-01-02 NOTE — Progress Notes (Signed)
 Progress Note   Patient: Claire Kelley FMW:[id number] DOB: 30-Nov-1926 DOA: 12/31/2023     1 DOS: the patient was seen and examined on 01/02/2024   Brief hospital course: Juliet Vasbinder is a 89 y.o. female with medical history significant of COPD on prn O2, DVT, PAF on Eliquis ,  CAD involving bypass graft, ischemic cardiomyopathy, dCHF, AICD,  HTN, renal artery stenosis, HLD, anemia of chronic disease, AAA without rupture s/p EVAR, CKD-4, , cognitive impairment, who presents with confusion, weakness, abdominal pain, constipation.   Assessment and Plan:    Hyperkalemia: K 5.2.  On presentation Potassium level improved Continue patiromer    Constipation Continue MiraLAX  and senokot   Abdominal pain: Possibly due to constipation. CT scan of the abdomen IV fluids-showed findings of constipation as well as diverticulosis with no evidence of diverticulitis   History of AAA (abdominal aortic aneurysm) [facility]): S/p of EVAR. Our radiologist reported enlargement of aneurysm with zide of 10.3 x 9.7 cm, however recent CT scan done at the [facility] on 10/12/2023 showed size of 7.9 x 9.9 cm. The size seems to be similar.  Anyway patient will never consider another surgical treatment or any procedure per her daughter, Therefore will not not consult VVS. We will continue to observe closely   Hypertension -Continue metoprolol  -IV hydralazine  as needed   Coronary artery disease: No chest pain -Patient stopped taking Crestor  per her daughter    Atrial fibrillation, chronic (HCC) -Metoprolol  -Continue amiodarone  -Eliquis    DVT (deep venous thrombosis) (HCC) -Eliquis    Chronic diastolic CHF (congestive heart failure) (HCC): 2D echo on 04/26/2023 showed EF > 50% with grade 3 diastolic dysfunction.  Patient does not have leg edema JVD.  CHF seem to be compensated.   Acute metabolic encephalopathy: Etiology is not clear, may be due to delirium and pain.  No focal neurodeficit on physical examination.  CT head  negative. -Frequent neurocheck -Fall precaution   AKI on CKD (chronic kidney disease), stage IV (HCC): Renal function close to baseline.  Recent baseline creatinine 1.4.  On presentation creatinine is at 2.43, BUN 57, GFR 18. -Also received IV fluid Monitor renal function closely   Hiatal hernia: Patient has a large hiatal hernia -Protonix        DVT ppx: on Eliquis    Code Status: DNR per her daughter   Family Communication:  Yes, patient's daughter   at bed side.       Disposition Plan:  Anticipate discharge back to previous environment   Consults called:  none   Admission status and Level of care: Telemetry Medical:    for obs      Subjective:  Patient seen and examined at bedside this morning Admits to improvement in respiratory function Calcium  level has improved Denies nausea vomiting chest pain  Physical Exam:   General: Not in acute distress.  Chronically dehydrated Heme: No neck lymph node enlargement. Cardiac: S1/S2, RRR, No murmurs, No gallops or rubs. Respiratory: No rales, wheezing, rhonchi or rubs. GI: Mildly tender right lower quadrant GU: No hematuria Ext: No pitting leg edema bilaterally. 1+DP/PT pulse bilaterally. Musculoskeletal: No joint deformities, No joint redness or warmth, no limitation of ROM in spin. Skin: No rashes.  Neuro: Mental status improving, moving all extremities Psych: Patient is not psychotic, no suicidal or hemocidal ideation.  Data Reviewed: I have reviewed patient's CT scan of the abdomen showing above findings     Latest Ref Rng & Units 01/02/2024    8:29 AM 01/01/2024    6:00 AM 12/31/2023  4:45 PM  CBC  WBC 4.0 - 10.5 K/uL 7.9  8.1  6.8   Hemoglobin 12.0 - 15.0 g/dL 88.4  88.9  88.1   Hematocrit 36.0 - 46.0 % 35.1  33.4  36.8   Platelets 150 - 400 K/uL 146  144  150        Latest Ref Rng & Units 01/02/2024    8:29 AM 01/01/2024    6:41 PM 01/01/2024    6:00 AM  BMP  Glucose 70 - 99 mg/dL 99  877  896   BUN 8 - 23  mg/dL 39  45  51   Creatinine 0.44 - 1.00 mg/dL 8.18  8.08  7.99   Sodium 135 - 145 mmol/L 140  139  140   Potassium 3.5 - 5.1 mmol/L 5.0  5.8  4.9   Chloride 98 - 111 mmol/L 111  107  107   CO2 22 - 32 mmol/L 20  20  21    Calcium  8.9 - 10.3 mg/dL 9.0  8.9  9.1     Vitals:   01/02/24 0808 01/02/24 0842 01/02/24 1027 01/02/24 1059  BP: (!) 131/92  (!) 119/20 133/70  Pulse: (!) 108  73 66  Resp: (!) 22  20 18   Temp:  97.7 F (36.5 C)  97.8 F (36.6 C)  TempSrc:  Oral    SpO2: 96%  98% 96%  Weight:    51 kg  Height:    5' 5 (1.651 m)        Author: Drue ONEIDA Potter, MD 01/02/2024 2:44 PM  For on call review [email or web address].

## 2024-01-02 NOTE — Evaluation (Signed)
 Occupational Therapy Evaluation Patient Details Name: Claire Kelley MRN: [id number] DOB: 1926-05-01 Today's Date: 01/02/2024   History of Present Illness 89 y.o. female who presents with confusion, weakness, abdominal pain, constipation. Admitting dx: hyperkalemia.  PMHx: COPD on prn O2, DVT, PAF on Eliquis ,  CAD involving bypass graft, ischemic cardiomyopathy, dCHF, AICD,  HTN, renal artery stenosis, HLD, anemia of chronic disease, AAA without rupture s/p EVAR, CKD-4, cognitive impairment.   Clinical Impression   Pt was seen for PT/OT co-evaluation this date to maximize pt/therapist safety d/t confusion and weakness. Prior to hospital admission, pt was residing at [facility] where she had assist for meals and medications. Per daughter she was MOD I with mobility and ADL performance using a rollator with no recent falls. Pt showers seated on shower seated 2x/wk and sponge bathes on the other days.  Pt presents to acute OT demonstrating impaired ADL performance and functional mobility 2/2 weakness, confusion, and balance deficits limiting her safety (See OT problem list for additional functional deficits). Pt currently requires SUP for bed mobility and Min/CGA for STS from EOB to RW.  She ambulated in the room with CGA x1 using RW for 2 trials to the bathroom and back. She demo shower transfer using RW, grab bars and shower seated with CGA. She demo LB dressing to doff/don sock seated in recliner with SUP and increased time. Pt denies pain. She is near her functional baseline, but does remain a fall risk and will benefit from acute OT services with [facility] services at her ALF upon DC recommended.        If plan is discharge home, recommend the following: A little help with walking and/or transfers;A little help with bathing/dressing/bathroom;Direct supervision/assist for financial management;Supervision due to cognitive status;Assist for transportation;Help with stairs or ramp for entrance;Direct  supervision/assist for medications management    Functional Status Assessment  Patient has had a recent decline in their functional status and demonstrates the ability to make significant improvements in function in a reasonable and predictable amount of time.  Equipment Recommendations  None recommended by OT    Recommendations for Other Services       Precautions / Restrictions Precautions Precautions: Fall Restrictions Weight Bearing Restrictions Per Provider Order: No      Mobility Bed Mobility Overal bed mobility: Needs Assistance Bed Mobility: Supine to Sit     Supine to sit: Supervision, HOB elevated, Used rails          Transfers Overall transfer level: Needs assistance Equipment used: Rolling walker (2 wheels) Transfers: Sit to/from Stand Sit to Stand: Contact guard assist, +2 safety/equipment           General transfer comment: CGA to Min A x1 for STS with 2nd assist for safety d/t cognition and weakness listed in chart      Balance Overall balance assessment: History of Falls, Needs assistance Sitting-balance support: Feet supported Sitting balance-Leahy Scale: Good Sitting balance - Comments: steady reaching within BOS to don socks   Standing balance support: Reliant on assistive device for balance, During functional activity, Bilateral upper extremity supported Standing balance-Leahy Scale: Fair Standing balance comment: reliant on AD for balance and use of grab bar in shower with CGA for maintaining balance                           ADL either performed or assessed with clinical judgement   ADL Overall ADL's : Needs assistance/impaired  Lower Body Dressing: Supervision/safety;Sitting/lateral leans Lower Body Dressing Details (indicate cue type and reason): to doff and don sock seated in recliner with increased time no physical assist for balance deficits noted         Tub/ Shower Transfer: Walk-in  shower;Ambulation;Shower seat;Rolling walker (2 wheels);Contact guard assist   Functional mobility during ADLs: Contact guard assist       Vision         Perception         Praxis         Pertinent Vitals/Pain Pain Assessment Pain Assessment: No/denies pain     Extremity/Trunk Assessment Upper Extremity Assessment Upper Extremity Assessment: Generalized weakness   Lower Extremity Assessment Lower Extremity Assessment: Generalized weakness       Communication Communication Communication: No apparent difficulties Cueing Techniques: Verbal cues   Cognition Arousal: Alert Behavior During Therapy: WFL for tasks assessed/performed Overall Cognitive Status: History of cognitive impairments - at baseline                                 General Comments: alert and O to person     General Comments  IV bleeding-removed by NT during session    Exercises Other Exercises Other Exercises: Edu on role of OT in acute setting and importance of therapy to maximize strength and IND. Other Exercises: Edu daughter on having ALF place BSC over top of toilet d/t low toilet height to maximize ease and decrease risk of getting stuck on the toilet by elevating height and having bars to push up from.   Shoulder Instructions      Home Living Family/patient expects to be discharged to:: Assisted living                             Home Equipment: Rollator (4 wheels);Shower seat - built in;BSC/3in1          Prior Functioning/Environment Prior Level of Function : Independent/Modified Independent             Mobility Comments: ambulates with rollator at ALF; no recent falls ADLs Comments: per daughter pt IND/MOD I with ADL performance at facility; showers 2x/wk and sponge bathes other days; ALF staff provides meal and meds; dtr reports some trouble with toilet transfers        OT Problem List: Decreased strength;Impaired balance (sitting and/or  standing);Decreased safety awareness      OT Treatment/Interventions: Self-care/ADL training;Therapeutic exercise;Patient/family education;Balance training;Therapeutic activities;DME and/or AE instruction    OT Goals(Current goals can be found in the care plan section) Acute Rehab OT Goals Patient Stated Goal: improve balance/strength OT Goal Formulation: With patient/family Time For Goal Achievement: 01/16/24 Potential to Achieve Goals: Good ADL Goals Pt Will Perform Upper Body Bathing: with set-up;sitting Pt Will Perform Lower Body Bathing: with supervision;sitting/lateral leans;sit to/from stand Pt Will Perform Upper Body Dressing: with set-up;sitting Pt Will Perform Lower Body Dressing: with supervision;sit to/from stand;sitting/lateral leans Pt Will Transfer to Toilet: with supervision;ambulating;regular height toilet;grab bars;bedside commode Pt Will Perform Toileting - Clothing Manipulation and hygiene: with supervision;sitting/lateral leans;sit to/from stand Pt Will Perform Tub/Shower Transfer: with supervision;shower seat;ambulating;grab bars;rolling walker  OT Frequency: Min 1X/week    Co-evaluation PT/OT/SLP Co-Evaluation/Treatment: Yes Reason for Co-Treatment: For patient/therapist safety;Necessary to address cognition/behavior during functional activity PT goals addressed during session: Mobility/safety with mobility OT goals addressed during session: ADL's and self-care  AM-PAC OT 6 Clicks Daily Activity     Outcome Measure Help from another person eating meals?: None Help from another person taking care of personal grooming?: None Help from another person toileting, which includes using toliet, bedpan, or urinal?: A Little Help from another person bathing (including washing, rinsing, drying)?: A Little Help from another person to put on and taking off regular upper body clothing?: None Help from another person to put on and taking off regular lower body  clothing?: A Little 6 Click Score: 21   End of Session Equipment Utilized During Treatment: Rolling walker (2 wheels);Gait belt Nurse Communication: Mobility status  Activity Tolerance: Patient tolerated treatment well Patient left: in chair;with call bell/phone within reach;with chair alarm set;with family/visitor present  OT Visit Diagnosis: Other abnormalities of gait and mobility (R26.89);Unsteadiness on feet (R26.81);History of falling (Z91.81)                Time: 8884-8863 OT Time Calculation (min): 21 min Charges:  OT General Charges $OT Visit: 1 Visit OT Evaluation $OT Eval Low Complexity: 1 Low Godson Pollan, OTR/L 01/02/24, 11:54 AM  Michella Detjen E Eurydice Calixto 01/02/2024, 11:50 AM

## 2024-01-02 NOTE — ED Notes (Signed)
 Introduced myself to patient. Patient is pleasantly confused and oriented to her name and birthday. Patient continues to ask this nurse" am I with the church?" Lab work obtained. Patient was given breakfast meal and is now sitting up eating breakfast.

## 2024-01-03 DIAGNOSIS — E875 Hyperkalemia: Secondary | ICD-10-CM | POA: Diagnosis not present

## 2024-01-03 LAB — URINALYSIS, COMPLETE (UACMP) WITH MICROSCOPIC
Bacteria, UA: NONE SEEN
Bilirubin Urine: NEGATIVE
Glucose, UA: NEGATIVE mg/dL
Hgb urine dipstick: NEGATIVE
Ketones, ur: NEGATIVE mg/dL
Leukocytes,Ua: NEGATIVE
Nitrite: NEGATIVE
Protein, ur: NEGATIVE mg/dL
RBC / HPF: 0 RBC/hpf (ref 0–5)
Specific Gravity, Urine: 1.015 (ref 1.005–1.030)
pH: 5 (ref 5.0–8.0)

## 2024-01-03 LAB — CBC WITH DIFFERENTIAL/PLATELET
Abs Immature Granulocytes: 0.02 10*3/uL (ref 0.00–0.07)
Basophils Absolute: 0 10*3/uL (ref 0.0–0.1)
Basophils Relative: 1 %
Eosinophils Absolute: 0.2 10*3/uL (ref 0.0–0.5)
Eosinophils Relative: 3 %
HCT: 31.2 % — ABNORMAL LOW (ref 36.0–46.0)
Hemoglobin: 10.2 g/dL — ABNORMAL LOW (ref 12.0–15.0)
Immature Granulocytes: 0 %
Lymphocytes Relative: 13 %
Lymphs Abs: 0.8 10*3/uL (ref 0.7–4.0)
MCH: 33.7 pg (ref 26.0–34.0)
MCHC: 32.7 g/dL (ref 30.0–36.0)
MCV: 103 fL — ABNORMAL HIGH (ref 80.0–100.0)
Monocytes Absolute: 0.7 10*3/uL (ref 0.1–1.0)
Monocytes Relative: 12 %
Neutro Abs: 4.5 10*3/uL (ref 1.7–7.7)
Neutrophils Relative %: 71 %
Platelets: 139 10*3/uL — ABNORMAL LOW (ref 150–400)
RBC: 3.03 MIL/uL — ABNORMAL LOW (ref 3.87–5.11)
RDW: 13 % (ref 11.5–15.5)
WBC: 6.3 10*3/uL (ref 4.0–10.5)
nRBC: 0 % (ref 0.0–0.2)

## 2024-01-03 LAB — BASIC METABOLIC PANEL
Anion gap: 7 (ref 5–15)
BUN: 44 mg/dL — ABNORMAL HIGH (ref 8–23)
CO2: 23 mmol/L (ref 22–32)
Calcium: 8.6 mg/dL — ABNORMAL LOW (ref 8.9–10.3)
Chloride: 110 mmol/L (ref 98–111)
Creatinine, Ser: 1.94 mg/dL — ABNORMAL HIGH (ref 0.44–1.00)
GFR, Estimated: 23 mL/min — ABNORMAL LOW (ref >60)
Glucose, Bld: 97 mg/dL (ref 70–99)
Potassium: 4.9 mmol/L (ref 3.5–5.1)
Sodium: 140 mmol/L (ref 135–145)

## 2024-01-03 MED ORDER — HYDRALAZINE HCL 50 MG PO TABS
25.0000 mg | ORAL_TABLET | Freq: Three times a day (TID) | ORAL | Status: DC
  Administered 2024-01-03: 25 mg via ORAL
  Filled 2024-01-03 (×2): qty 1

## 2024-01-03 NOTE — TOC Progression Note (Signed)
 Transition of Care [facility]) - Progression Note    Patient Details  Name: Claire Kelley MRN: [id number] Date of Birth: 04-Sep-1926  [facility]) CM/SW Contact  Aleene Amor, LCSW Phone Number: 01/03/2024, 12:47 PM  Clinical Narrative:   [facility] states they are unable to take pt back until she is able to walk more. Message relayed to daughter. Daughter agreeable to try rehab. First choices are [facility]. CSW has sent referral out.         Expected Discharge Plan and Services                                               Social Determinants of Health (SDOH) Interventions SDOH Screenings   Food Insecurity: No Food Insecurity (01/02/2024)  Housing: Low Risk  (01/02/2024)  Transportation Needs: No Transportation Needs (01/02/2024)  Utilities: Not At Risk (01/02/2024)  Financial Resource Strain: Low Risk  (04/29/2023)   Received from [facility], [facility]  Social Connections: Moderately Isolated (01/02/2024)  Tobacco Use: Medium Risk (01/02/2024)    Readmission Risk Interventions     No data to display

## 2024-01-03 NOTE — Plan of Care (Signed)
 Reached out to on call NP - holding 25 mg hydralazine  for BP 112/76 and 54 HR.

## 2024-01-03 NOTE — NC FL2 (Signed)
[place] MEDICAID FL2 LEVEL OF CARE FORM     IDENTIFICATION  Patient Name: Claire Kelley Birthdate: 10-Feb-1926 Sex: female Admission Date (Current Location): 12/31/2023  [facility] and [place] Number:  Chiropodist and Address:  [facility], [address], [place], [place] [address]      Provider Number: [id number]  Attending Physician Name and Address:  Loyce Dys, MD  Relative Name and Phone Number:  Patient address [address]  [place] [address]    Current Level of Care: Hospital Recommended Level of Care: Skilled Nursing Facility Prior Approval Number:    Date Approved/Denied:   PASRR Number: [id number]  Discharge Plan: SNF    Current Diagnoses: Patient Active Problem List   Diagnosis Date Noted   Constipation 01/01/2024   Acute metabolic encephalopathy 01/01/2024   DVT (deep venous thrombosis) (HCC) 01/01/2024   Hyperkalemia 12/31/2023   Chronic diastolic CHF (congestive heart failure) (HCC) 12/31/2023   Acute renal failure superimposed on stage 3b chronic kidney disease (HCC) 10/15/2021   Multifocal pneumonia 10/10/2021   Pleural effusion on left 10/10/2021   Hypertension    GERD (gastroesophageal reflux disease)    Coronary artery disease    A-fib (HCC)    Atrial fibrillation, chronic (HCC)    Abdominal pain    Hiatal hernia    AAA (abdominal aortic aneurysm) (HCC)    Left rib fracture    Sepsis (HCC)    CKD (chronic kidney disease), stage IV (HCC)    Acute cystitis 05/29/2015   Altered mental status 05/27/2015    Orientation RESPIRATION BLADDER Height & Weight     Self, Place  Normal Continent Weight: 112 lb 8 oz (51 kg) Height:  5\' 5"  (165.1 cm)  BEHAVIORAL SYMPTOMS/MOOD NEUROLOGICAL BOWEL NUTRITION STATUS      Continent    AMBULATORY STATUS COMMUNICATION OF NEEDS Skin     Verbally Normal                       Personal Care Assistance Level of Assistance              Functional  Limitations Info             SPECIAL CARE FACTORS FREQUENCY  OT (By licensed OT), PT (By licensed PT)     PT Frequency: 5 times a week OT Frequency: 5 times a week            Contractures Contractures Info: Not present    Additional Factors Info  Code Status, Allergies Code Status Info: DNR-limited Allergies Info: Bactrim (Sulfamethoxazole-trimethoprim), Ciprocinonide (Fluocinolone), Ciprofloxacin, Codeine, Lipitor (Atorvastatin), Macrobid (Nitrofurantoin Monohyd Macro), Paxil (Paroxetine Hcl)           Current Medications (01/03/2024):  This is the current hospital active medication list Current Facility-Administered Medications  Medication Dose Route Frequency Provider Last Rate Last Admin   acetaminophen (TYLENOL) tablet 650 mg  650 mg Oral Q6H PRN Lorretta Harp, MD       amiodarone (PACERONE) tablet 100 mg  100 mg Oral Daily Lorretta Harp, MD   100 mg at 01/03/24 0900   apixaban (ELIQUIS) tablet 2.5 mg  2.5 mg Oral BID Lorretta Harp, MD   2.5 mg at 01/03/24 0900   hydrALAZINE (APRESOLINE) injection 5 mg  5 mg Intravenous Q2H PRN Lorretta Harp, MD   5 mg at 01/01/24 0865   melatonin tablet 2.5 mg  2.5 mg Oral QHS PRN Lorretta Harp, MD  metoprolol succinate (TOPROL-XL) 24 hr tablet 25 mg  25 mg Oral Daily Lorretta Harp, MD   25 mg at 01/03/24 0900   morphine (PF) 2 MG/ML injection 0.5 mg  0.5 mg Intravenous Q4H PRN Lorretta Harp, MD   0.5 mg at 01/01/24 1104   ondansetron (ZOFRAN) injection 4 mg  4 mg Intravenous Q8H PRN Lorretta Harp, MD       pantoprazole (PROTONIX) EC tablet 40 mg  40 mg Oral Daily Lorretta Harp, MD   40 mg at 01/03/24 0900   patiromer Lelon Perla) packet 8.4 g  8.4 g Oral Daily Lorretta Harp, MD   8.4 g at 01/03/24 1230   polyethylene glycol (MIRALAX / GLYCOLAX) packet 17 g  17 g Oral Daily Lorretta Harp, MD   17 g at 01/03/24 0901   senna-docusate (Senokot-S) tablet 1 tablet  1 tablet Oral BID Lorretta Harp, MD   1 tablet at 01/03/24 0900     Discharge Medications: Please see  discharge summary for a list of discharge medications.  Relevant Imaging Results:  Relevant Lab Results:   Additional Information SS [id number]  Allena Katz, LCSW

## 2024-01-03 NOTE — Progress Notes (Addendum)
 Progress Note   Patient: Claire Kelley [id number] DOB: October 28, 1926 DOA: 12/31/2023     2 DOS: the patient was seen and examined on 01/03/2024    Brief hospital course: Claire Kelley is a 89 y.o. female with medical history significant of COPD on prn O2, DVT, PAF on Eliquis ,  CAD involving bypass graft, ischemic cardiomyopathy, dCHF, AICD,  HTN, renal artery stenosis, HLD, anemia of chronic disease, AAA without rupture s/p EVAR, CKD-4, , cognitive impairment, who presents with confusion, weakness, abdominal pain, constipation.   Assessment and Plan:  Hyperkalemia: K 5.2.  On presentation Potassium level improved Continue patiromer    Constipation Continue MiraLAX  and senokot   Abdominal pain: Possibly due to constipation. CT scan of the abdomen IV fluids-showed findings of constipation as well as diverticulosis with no evidence of diverticulitis   History of AAA (abdominal aortic aneurysm) [facility]): S/p of EVAR. Our radiologist reported enlargement of aneurysm with zide of 10.3 x 9.7 cm, however recent CT scan done at the [facility] on 10/12/2023 showed size of 7.9 x 9.9 cm. The size seems to be similar.  Anyway patient will never consider another surgical treatment or any procedure per her daughter, Therefore will not not consult VVS. We will continue to observe closely   Hypertension -Continue metoprolol  Continue hydralazine    Coronary artery disease: No chest pain -Patient stopped taking Crestor  per her daughter     Atrial fibrillation, chronic (HCC) -Metoprolol  -Continue amiodarone  Continue Eliquis    DVT (deep venous thrombosis) (HCC) -Eliquis    Chronic diastolic CHF (congestive heart failure) (HCC): 2D echo on 04/26/2023 showed EF > 50% with grade 3 diastolic dysfunction.  Patient does not have leg edema JVD.  CHF seem to be compensated.   Acute metabolic encephalopathy: Etiology is not clear, may be due to delirium and pain.  No focal neurodeficit on physical examination.  CT  head negative. -Frequent neurocheck Continue fall precaution   AKI on CKD (chronic kidney disease), stage IV (HCC): Improved  renal function close to baseline.  Recent baseline creatinine 1.4.  On presentation creatinine is at 2.43, BUN 57, GFR 18. -Also received IV fluid Continue to monitor renal function   Hiatal hernia: Patient has a large hiatal hernia Continue Protonix    DVT ppx: on Eliquis    Code Status: DNR    Family Communication:  Yes, patient's daughter over the phone   Disposition Plan:  Anticipate discharge back to previous environment   Consults called:  none   Admission status and Level of care: Telemetry Medical:    for obs      Subjective:  Patient seen and examined at bedside this morning Denies nausea vomiting abdominal pain chest pain or cough   Physical Exam:   General: Not in acute distress.  Chronically dehydrated Heme: No neck lymph node enlargement. Cardiac: S1/S2, RRR, No murmurs, No gallops or rubs. Respiratory: No rales, wheezing, rhonchi or rubs. GI: Mildly tender right lower quadrant GU: No hematuria Ext: No pitting leg edema bilaterally. 1+DP/PT pulse bilaterally. Musculoskeletal: No joint deformities, No joint redness or warmth, no limitation of ROM in spin. Skin: No rashes.  Neuro: Mental status improving, moving all extremities Psych: Patient is not psychotic, no suicidal or hemocidal ideation.   Data Reviewed: I have reviewed patient's CT scan of the abdomen showing above findings      Latest Ref Rng & Units 01/03/2024    5:02 AM 01/02/2024    8:29 AM 01/01/2024    6:41 PM  BMP  Glucose 70 -  99 mg/dL 97  99  161   BUN 8 - 23 mg/dL 44  39  45   Creatinine 0.44 - 1.00 mg/dL 0.96  0.45  4.09   Sodium 135 - 145 mmol/L 140  140  139   Potassium 3.5 - 5.1 mmol/L 4.9  5.0  5.8   Chloride 98 - 111 mmol/L 110  111  107   CO2 22 - 32 mmol/L 23  20  20    Calcium  8.9 - 10.3 mg/dL 8.6  9.0  8.9      Vitals:   01/03/24 0422 01/03/24 0819  01/03/24 1422 01/03/24 1528  BP: (!) 146/68 (!) 141/72 136/70 (!) 140/75  Pulse: (!) 53 69 68 78  Resp:  16 16   Temp: (!) 97.4 F (36.3 C) (!) 97.5 F (36.4 C)  (!) 97.1 F (36.2 C)  TempSrc:      SpO2: 99% 97%  99%  Weight:      Height:          Latest Ref Rng & Units 01/03/2024    5:02 AM 01/02/2024    8:29 AM 01/01/2024    6:00 AM  CBC  WBC 4.0 - 10.5 K/uL 6.3  7.9  8.1   Hemoglobin 12.0 - 15.0 g/dL 81.1  91.4  78.2   Hematocrit 36.0 - 46.0 % 31.2  35.1  33.4   Platelets 150 - 400 K/uL 139  146  144     Disposition: Currently awaiting rehab placement as requested by [facility] and patient's daughter hopefully tomorrow  Author: Ezzard Holms, MD 01/03/2024 4:33 PM  For on call review [email or web address].

## 2024-01-04 DIAGNOSIS — E875 Hyperkalemia: Secondary | ICD-10-CM | POA: Diagnosis not present

## 2024-01-04 LAB — CBC WITH DIFFERENTIAL/PLATELET
Abs Immature Granulocytes: 0.02 10*3/uL (ref 0.00–0.07)
Basophils Absolute: 0 10*3/uL (ref 0.0–0.1)
Basophils Relative: 0 %
Eosinophils Absolute: 0.2 10*3/uL (ref 0.0–0.5)
Eosinophils Relative: 2 %
HCT: 33.2 % — ABNORMAL LOW (ref 36.0–46.0)
Hemoglobin: 10.7 g/dL — ABNORMAL LOW (ref 12.0–15.0)
Immature Granulocytes: 0 %
Lymphocytes Relative: 13 %
Lymphs Abs: 0.9 10*3/uL (ref 0.7–4.0)
MCH: 34 pg (ref 26.0–34.0)
MCHC: 32.2 g/dL (ref 30.0–36.0)
MCV: 105.4 fL — ABNORMAL HIGH (ref 80.0–100.0)
Monocytes Absolute: 0.7 10*3/uL (ref 0.1–1.0)
Monocytes Relative: 10 %
Neutro Abs: 5.1 10*3/uL (ref 1.7–7.7)
Neutrophils Relative %: 75 %
Platelets: 141 10*3/uL — ABNORMAL LOW (ref 150–400)
RBC: 3.15 MIL/uL — ABNORMAL LOW (ref 3.87–5.11)
RDW: 13 % (ref 11.5–15.5)
WBC: 6.8 10*3/uL (ref 4.0–10.5)
nRBC: 0 % (ref 0.0–0.2)

## 2024-01-04 LAB — BASIC METABOLIC PANEL
Anion gap: 9 (ref 5–15)
BUN: 45 mg/dL — ABNORMAL HIGH (ref 8–23)
CO2: 22 mmol/L (ref 22–32)
Calcium: 8.6 mg/dL — ABNORMAL LOW (ref 8.9–10.3)
Chloride: 106 mmol/L (ref 98–111)
Creatinine, Ser: 1.94 mg/dL — ABNORMAL HIGH (ref 0.44–1.00)
GFR, Estimated: 23 mL/min — ABNORMAL LOW (ref >60)
Glucose, Bld: 92 mg/dL (ref 70–99)
Potassium: 4.7 mmol/L (ref 3.5–5.1)
Sodium: 137 mmol/L (ref 135–145)

## 2024-01-04 NOTE — TOC Progression Note (Signed)
Transition of Care [facility]) - Progression Note    Patient Details  Name: Claire Kelley MRN: [id number] Date of Birth: [birth year]  [facility] CM/SW Contact  Allena Katz, LCSW Phone Number: 01/04/2024, 12:02 PM  Clinical Narrative:   Pt to [facility] no beds today.         Expected Discharge Plan and Services                                               Social Determinants of Health (SDOH) Interventions SDOH Screenings   Food Insecurity: No Food Insecurity (01/02/2024)  Housing: Low Risk  (01/02/2024)  Transportation Needs: No Transportation Needs (01/02/2024)  Utilities: Not At Risk (01/02/2024)  Financial Resource Strain: Low Risk  (04/29/2023)   Received from [facility], [facility]  Social Connections: Moderately Isolated (01/02/2024)  Tobacco Use: Medium Risk (01/02/2024)    Readmission Risk Interventions     No data to display

## 2024-01-04 NOTE — Progress Notes (Signed)
Progress Note   Patient: Claire Kelley RUE:[id number] DOB: 30-Jan-1926 DOA: 12/31/2023     3 DOS: the patient was seen and examined on 01/04/2024    Brief hospital course: Claire Kelley is a 89 y.o. female with medical history significant of COPD on prn O2, DVT, PAF on Eliquis,  CAD involving bypass graft, ischemic cardiomyopathy, dCHF, AICD,  HTN, renal artery stenosis, HLD, anemia of chronic disease, AAA without rupture s/p EVAR, CKD-4, , cognitive impairment, who presents with confusion, weakness, abdominal pain, constipation.   Assessment and Plan:   Hyperkalemia: K 5.2.  On presentation Potassium level improved Continue patiromer   Constipation Continue MiraLAX and senokot   Abdominal pain: Possibly due to constipation. CT scan of the abdomen IV fluids-showed findings of constipation as well as diverticulosis with no evidence of diverticulitis   History of AAA (abdominal aortic aneurysm) [facility]): S/p of EVAR. Our radiologist reported enlargement of aneurysm with zide of 10.3 x 9.7 cm, however recent CT scan done at the [facility] on 10/12/2023 showed size of 7.9 x 9.9 cm. The size seems to be similar.  Anyway patient will never consider another surgical treatment or any procedure per her daughter, Therefore will not not consult [facility]. We will continue to observe closely   Hypertension Continue metoprolol Continue hydralazine   Coronary artery disease: No chest pain -Patient stopped taking Crestor per her daughter     Atrial fibrillation, chronic (HCC) -Metoprolol -Continue amiodarone Continue Eliquis   DVT (deep venous thrombosis) (HCC) -Eliquis   Chronic diastolic CHF (congestive heart failure) (HCC): 2D echo on 04/26/2023 showed EF > 50% with grade 3 diastolic dysfunction.  Patient does not have leg edema JVD.  CHF seem to be compensated.   Acute metabolic encephalopathy: Etiology is not clear, may be due to delirium and pain.  No focal neurodeficit on physical examination.  CT  head negative. -Frequent neurocheck Continue fall precaution   AKI on CKD (chronic kidney disease), stage IV (HCC): Improved  renal function close to baseline.  Recent baseline creatinine 1.4.  On presentation creatinine is at 2.43, BUN 57, GFR 18. -Also received IV fluid Continue to monitor renal function   Hiatal hernia: Patient has a large hiatal hernia Continue Protonix   DVT ppx: on Eliquis   Code Status: DNR    Family Communication:  Yes, patient's daughter over the phone   Disposition Plan:  Anticipate discharge back to previous environment   Consults called:  none   Admission status and Level of care: Telemetry Medical:    for obs      Subjective:  Patient seen and examined at bedside this morning Denies nausea vomiting abdominal pain Improvement in mental status   Physical Exam:   General: Not in acute distress.  Chronically dehydrated Heme: No neck lymph node enlargement. Cardiac: S1/S2, RRR, No murmurs, No gallops or rubs. Respiratory: No rales, wheezing, rhonchi or rubs. GI: Mildly tender right lower quadrant GU: No hematuria Ext: No pitting leg edema bilaterally. 1+DP/PT pulse bilaterally. Musculoskeletal: No joint deformities, No joint redness or warmth, no limitation of ROM in spin. Skin: No rashes.  Neuro: Mental status improving, moving all extremities Psych: Patient is not psychotic, no suicidal or hemocidal ideation.   Data Reviewed: I have reviewed patient's CT scan of the abdomen showing above findings      Latest Ref Rng & Units 01/04/2024    4:18 AM 01/03/2024    5:02 AM 01/02/2024    8:29 AM  BMP  Glucose 70 -  99 mg/dL 92  97  99   BUN 8 - 23 mg/dL 45  44  39   Creatinine 0.44 - 1.00 mg/dL 1.61  0.96  0.45   Sodium 135 - 145 mmol/L 137  140  140   Potassium 3.5 - 5.1 mmol/L 4.7  4.9  5.0   Chloride 98 - 111 mmol/L 106  110  111   CO2 22 - 32 mmol/L 22  23  20    Calcium 8.9 - 10.3 mg/dL 8.6  8.6  9.0      Vitals:   01/03/24 1528  01/03/24 1948 01/04/24 0356 01/04/24 0727  BP: (!) 140/75 112/76 114/62 120/68  Pulse: 78 (!) 54 63 64  Resp:    17  Temp: (!) 97.1 F (36.2 C) 97.8 F (36.6 C) 98.3 F (36.8 C) 97.6 F (36.4 C)  TempSrc:      SpO2: 99% 91% 95% 95%  Weight:      Height:          Latest Ref Rng & Units 01/04/2024    4:18 AM 01/03/2024    5:02 AM 01/02/2024    8:29 AM  CBC  WBC 4.0 - 10.5 K/uL 6.8  6.3  7.9   Hemoglobin 12.0 - 15.0 g/dL 40.9  81.1  91.4   Hematocrit 36.0 - 46.0 % 33.2  31.2  35.1   Platelets 150 - 400 K/uL 141  139  146     Disposition: According to [facility] rehab will be ready to accept patient tomorrow  Author: Loyce Dys, MD 01/04/2024 2:33 PM  For on call review [email or web address].

## 2024-01-04 NOTE — Care Management Important Message (Signed)
Important Message  Patient Details  Name: Claire Kelley MRN: [id number] Date of Birth: June 18, 1926   Important Message Given:  Yes - Medicare IM     Cristela Blue, CMA 01/04/2024, 10:31 AM

## 2024-01-05 DIAGNOSIS — E875 Hyperkalemia: Secondary | ICD-10-CM | POA: Diagnosis not present

## 2024-01-05 LAB — BASIC METABOLIC PANEL
Anion gap: 8 (ref 5–15)
BUN: 41 mg/dL — ABNORMAL HIGH (ref 8–23)
CO2: 22 mmol/L (ref 22–32)
Calcium: 8.4 mg/dL — ABNORMAL LOW (ref 8.9–10.3)
Chloride: 107 mmol/L (ref 98–111)
Creatinine, Ser: 2.02 mg/dL — ABNORMAL HIGH (ref 0.44–1.00)
GFR, Estimated: 22 mL/min — ABNORMAL LOW (ref >60)
Glucose, Bld: 101 mg/dL — ABNORMAL HIGH (ref 70–99)
Potassium: 4.5 mmol/L (ref 3.5–5.1)
Sodium: 137 mmol/L (ref 135–145)

## 2024-01-05 MED ORDER — TRAMADOL HCL 50 MG PO TABS: 50.0000 mg | ORAL_TABLET | Freq: Four times a day (QID) | ORAL | 0 refills | Status: AC | PRN

## 2024-01-05 NOTE — Progress Notes (Signed)
Patient's daughter at bedside. Provided with d/c packet to give to [facility].

## 2024-01-05 NOTE — Progress Notes (Signed)
Report called to [facility]. All questions answered. Aware daughter to transport at noon

## 2024-01-05 NOTE — Discharge Summary (Signed)
Physician Discharge Summary   Patient: Claire Kelley: [id number] DOB: 1926/06/15  Admit date:     12/31/2023  Discharge date: 01/05/24  Discharge Physician: Loyce Dys   PCP: Eldridge Abrahams, MD   Recommendations at discharge:  Follow-up with primary care physician  Discharge Diagnoses: Hyperkalemia:-Resolved Abdominal pain: Possibly due to constipation.-Resolved History of AAA (abdominal aortic aneurysm) (HCC):  Hypertension Coronary artery disease: No chest pain Atrial fibrillation, chronic (HCC) DVT (deep venous thrombosis) (HCC) Chronic diastolic CHF (congestive heart failure) (HCC): Acute metabolic encephalopathy:  AKI on CKD (chronic kidney disease), stage IV (HCC): Improved Hiatal hernia:  Dehydration in the setting of poor oral intake as well as Lasix use  Hospital Course: Claire Kelley is a 89 y.o. female with medical history significant of COPD on prn O2, DVT, PAF on Eliquis,  CAD involving bypass graft, ischemic cardiomyopathy, dCHF, AICD,  HTN, renal artery stenosis, HLD, anemia of chronic disease, AAA without rupture s/p EVAR, CKD-4, , cognitive impairment, who presents with confusion, weakness, abdominal pain, constipation.  Urinalysis was negative for UTI. Patient was found to be dehydrated with acute kidney injury requiring IV fluid.  Potassium was also elevated requiring Veltassa.  Patient is now back to baseline and therefore being discharged to follow-up with primary care physician.  Consultants: None Procedures performed: None Disposition: Skilled nursing facility Diet recommendation:  Cardiac diet DISCHARGE MEDICATION: Allergies as of 01/05/2024       Reactions   Bactrim [sulfamethoxazole-trimethoprim] Other (See Comments)   Reaction:  Unknown    Ciprocinonide [fluocinolone] Other (See Comments)   Reaction:  Unknown    Ciprofloxacin Other (See Comments)   Codeine    Lipitor [atorvastatin] Other (See Comments)   Reaction:  Leg cramps     Macrobid [nitrofurantoin Monohyd Macro] Other (See Comments)   Reaction:  Unknown    Paxil [paroxetine Hcl] Other (See Comments)   Reaction:  Made pt "loopy"        Medication List     STOP taking these medications    clobetasol cream 0.05 % Commonly known as: TEMOVATE   furosemide 20 MG tablet Commonly known as: LASIX   Iron-Vitamin C 100-250 MG Tabs   prochlorperazine 5 MG tablet Commonly known as: COMPAZINE   vitamin B-12 100 MCG tablet Commonly known as: CYANOCOBALAMIN       TAKE these medications    acetaminophen 650 MG CR tablet Commonly known as: TYLENOL Take 1,300 mg by mouth every 8 (eight) hours as needed for pain.   amiodarone 200 MG tablet Commonly known as: PACERONE Take 0.5 tablets by mouth daily.   Biotin 1000 MCG Chew Chew 500 mg by mouth daily.   Cranberry 500 MG Caps Take 1 capsule by mouth daily.   CRANBERRY-VITAMIN C PO Take 1 capsule by mouth 2 (two) times daily.   Cranberry-Vitamin C-Vitamin E 140-100-3 MG-MG-UNIT Caps Take by mouth.   donepezil 5 MG tablet Commonly known as: ARICEPT Take by mouth.   Eliquis 2.5 MG Tabs tablet Generic drug: apixaban Take 2.5 mg by mouth 2 (two) times daily.   hydrALAZINE 25 MG tablet Commonly known as: APRESOLINE Take 25 mg by mouth 2 (two) times daily as needed.   magnesium gluconate 500 MG tablet Commonly known as: MAGONATE Take 250 mg by mouth 2 (two) times daily.   melatonin 1 MG Tabs tablet Take 1 tablet by mouth at bedtime as needed.   metoprolol succinate 25 MG 24 hr tablet Commonly known as: TOPROL-XL Take 0.5 tablets (  12.5 mg total) by mouth daily.   oxybutynin 5 MG 24 hr tablet Commonly known as: DITROPAN-XL Take 1 tablet by mouth at bedtime.   pantoprazole 40 MG tablet Commonly known as: PROTONIX Take 40 mg by mouth daily.   rosuvastatin 5 MG tablet Commonly known as: CRESTOR Take 5 mg by mouth daily.   senna-docusate 8.6-50 MG tablet Commonly known as:  Senokot-S Take 1 tablet by mouth as needed.   traMADol 50 MG tablet Commonly known as: ULTRAM Take 1 tablet (50 mg total) by mouth every 6 (six) hours as needed for moderate pain (May increase to 2 tablets q 6 hrs PRN).   Vitamin D3 25 MCG (1000 UT) Caps Take 1 capsule by mouth daily.   vitamin D3 25 MCG tablet Commonly known as: CHOLECALCIFEROL Take 1 tablet (1,000 Units total) by mouth daily.        Contact information for follow-up providers     [place], [place] Bailiff, MD Follow up.   Specialty: Family Medicine Why: Hospital follow up Contact information: [address] [place] [address] [phone]              Contact information for after-discharge care     Destination     [facility] Preferred SNF .   Service: Skilled Nursing Contact information: [address] [place] [address] [phone]                    Discharge Exam: Claire Kelley Weights   01/02/24 1059 01/05/24 0500  Weight: 51 kg 50.1 kg   General: Not in acute distress.  Chronically dehydrated Heme: No neck lymph node enlargement. Cardiac: S1/S2, RRR, No murmurs, No gallops or rubs. Respiratory: No rales, wheezing, rhonchi or rubs. GI: Mildly tender right lower quadrant GU: No hematuria Ext: No pitting leg edema bilaterally. 1+DP/PT pulse bilaterally. Musculoskeletal: No joint deformities, No joint redness or warmth, no limitation of ROM in spin. Skin: No rashes.  Neuro: Mental status at baseline Psych: Patient is not psychotic, no suicidal or hemocidal ideation.    Condition at discharge: good  The results of significant diagnostics from this hospitalization (including imaging, microbiology, ancillary and laboratory) are listed below for reference.   Imaging Studies: CT ABDOMEN PELVIS WO CONTRAST Result Date: 12/31/2023 CLINICAL DATA:  Altered mental status, unknown cause, and lower abdominal pain. EXAM: CT HEAD WITHOUT CONTRAST CT  ABDOMEN AND PELVIS WITHOUT CONTRAST TECHNIQUE: Contiguous axial images were obtained from the base of the skull through the vertex without intravenous contrast. Multiplanar CT image reconstructions were also generated. Multidetector CT imaging of the abdomen and pelvis was performed following the standard protocol without IV contrast. RADIATION DOSE REDUCTION: This exam was performed according to the departmental dose-optimization program which includes automated exposure control, adjustment of the mA and/or kV according to patient size and/or use of iterative reconstruction technique. COMPARISON:  Head CT 01/16/2021, and abdomen and pelvis CT no contrast 10/10/2021. FINDINGS: CT HEAD FINDINGS Brain: There is mild cerebellar atrophy and mild to moderate cerebral atrophy, small-vessel disease and atrophic ventriculomegaly commensurate with age. Senescent mineralization noted both basal ganglia as well as chronic tiny lacunar infarcts in the thalami and basal ganglia. No cortical based acute infarct, hemorrhage, mass or mass effect is seen. Basal cisterns are clear. Vascular: There are calcific plaques in the carotid siphons. There are no hyperdense central vessels. Scattered calcification in the V4 vertebral arteries. Skull: Negative for fractures or focal lesions. Sinuses/Orbits: Chronic hyperostosis both  maxillary and sphenoid sinuses, slight membrane thickening in the maxillary sinuses with dystrophic linear mucosal calcifications in keeping with chronic disease. Trace membrane disease is again noted in the sphenoid sinus and mild opacification in the anterior ethmoids. The frontal sinuses, mastoid air cells, and middle ears are clear. Other: None. CT ABDOMEN AND PELVIS FINDINGS Lower chest: Large hiatal hernia with intrathoracic stomach to the left, adjacent atelectasis. Diffuse bronchial thickening. Increased linear atelectasis in the lower lobes without lung base infiltrates. Linear scarring or atelectasis in  the anterior lung bases. No pleural effusion is seen. The heart is moderately enlarged, with sternotomy and CABG changes, heavily calcified thoracic aorta and coronary arteries, and metal artifact from pacemaker wiring in the right heart. No pericardial effusion. Hepatobiliary: The liver is unremarkable without contrast. The gallbladder and bile ducts are unremarkable. Pancreas: Diffusely atrophic. No focal abnormality without contrast. Spleen: No abnormality.  No splenomegaly. Adrenals/Urinary Tract: There is no adrenal mass. Stable 5 cm Bosniak 2 interpolar left renal cyst with a few linear wall calcifications. Stable 1.6 cm Bosniak 1 cyst, Hounsfield density is 13, lower pole right kidney. No follow-up imaging is recommended. There are occasional punctate nonobstructive caliceal stones in the left kidney and a single 4 mm nonobstructive caliceal stone in the inferior pole of the right. No suspicious renal masses seen without contrast, no ureteral stones or urinary obstruction, no bladder thickening. Stomach/Bowel: Large hiatal hernia with intrathoracic stomach. No small bowel dilatation or inflammatory change. Normal caliber appendix. Mild fecal stasis. Diffuse diverticulosis seen heaviest in the sigmoid segment without evidence of diverticulitis. Vascular/Lymphatic: Heavy aortic and branch vessel atherosclerosis. Aortobi-iliac stent graft again noted. Large excluded aneurysm sac is larger than previously, today measuring 10.3 x 7.9 cm on 2:40, previously 8.5 x 6.4 cm. There are no overt adjacent inflammatory changes, no evidence of adjacent hemorrhage. The aneurysm displaces bowel around it. Multiple pelvic phleboliths.  No abdominal or pelvic adenopathy. Reproductive: Uterus and bilateral adnexa are unremarkable. Other: There previously was a left inguinal hernia which appears to have been repaired in the interval. There is no incarcerated hernia. No free fluid, free air or free hemorrhage. There are mild  mesenteric congestive features, mild edema along the body wall. Musculoskeletal: Reverse S shaped lower thoracic to lumbar scoliosis noted, osteopenia and advanced degenerative changes of the spine. Moderate hip DJD. No acute or other significant osseous findings. Chronic healed fractures of the right pubic rami. IMPRESSION: 1. No acute intracranial CT findings or interval changes. Chronic changes. 2. Large hiatal hernia with intrathoracic stomach. 3. Bronchitis.  No focal infiltrate in the imaged lung bases. 4. Constipation and diverticulosis. 5. Aortobi-iliac stent graft with increased size of the excluded aneurysm sac, today 10.3 x 7.9 cm, previously 8.5 x 6.4 cm. No overt adjacent inflammatory changes or evidence of adjacent hemorrhage. Vascular surgery referral recommended. 6. Nonobstructive nephrolithiasis. 7. Cardiomegaly with aortic and coronary artery atherosclerosis. Prior CABG. 8. Mild mesenteric congestive features and mild edema along the body wall. 9. Appears to have had interval repair of the prior left inguinal hernia. No incarcerated hernia. 10. Osteopenia and degenerative change. 11. Chronic sinus disease. Aortic Atherosclerosis (ICD10-I70.0). Electronically Signed   By: Almira Bar M.D.   On: 12/31/2023 22:59   CT Head Wo Contrast Result Date: 12/31/2023 CLINICAL DATA:  Altered mental status, unknown cause, and lower abdominal pain. EXAM: CT HEAD WITHOUT CONTRAST CT ABDOMEN AND PELVIS WITHOUT CONTRAST TECHNIQUE: Contiguous axial images were obtained from the base of the skull through the  vertex without intravenous contrast. Multiplanar CT image reconstructions were also generated. Multidetector CT imaging of the abdomen and pelvis was performed following the standard protocol without IV contrast. RADIATION DOSE REDUCTION: This exam was performed according to the departmental dose-optimization program which includes automated exposure control, adjustment of the mA and/or kV according to  patient size and/or use of iterative reconstruction technique. COMPARISON:  Head CT 01/16/2021, and abdomen and pelvis CT no contrast 10/10/2021. FINDINGS: CT HEAD FINDINGS Brain: There is mild cerebellar atrophy and mild to moderate cerebral atrophy, small-vessel disease and atrophic ventriculomegaly commensurate with age. Senescent mineralization noted both basal ganglia as well as chronic tiny lacunar infarcts in the thalami and basal ganglia. No cortical based acute infarct, hemorrhage, mass or mass effect is seen. Basal cisterns are clear. Vascular: There are calcific plaques in the carotid siphons. There are no hyperdense central vessels. Scattered calcification in the V4 vertebral arteries. Skull: Negative for fractures or focal lesions. Sinuses/Orbits: Chronic hyperostosis both maxillary and sphenoid sinuses, slight membrane thickening in the maxillary sinuses with dystrophic linear mucosal calcifications in keeping with chronic disease. Trace membrane disease is again noted in the sphenoid sinus and mild opacification in the anterior ethmoids. The frontal sinuses, mastoid air cells, and middle ears are clear. Other: None. CT ABDOMEN AND PELVIS FINDINGS Lower chest: Large hiatal hernia with intrathoracic stomach to the left, adjacent atelectasis. Diffuse bronchial thickening. Increased linear atelectasis in the lower lobes without lung base infiltrates. Linear scarring or atelectasis in the anterior lung bases. No pleural effusion is seen. The heart is moderately enlarged, with sternotomy and CABG changes, heavily calcified thoracic aorta and coronary arteries, and metal artifact from pacemaker wiring in the right heart. No pericardial effusion. Hepatobiliary: The liver is unremarkable without contrast. The gallbladder and bile ducts are unremarkable. Pancreas: Diffusely atrophic. No focal abnormality without contrast. Spleen: No abnormality.  No splenomegaly. Adrenals/Urinary Tract: There is no adrenal  mass. Stable 5 cm Bosniak 2 interpolar left renal cyst with a few linear wall calcifications. Stable 1.6 cm Bosniak 1 cyst, Hounsfield density is 13, lower pole right kidney. No follow-up imaging is recommended. There are occasional punctate nonobstructive caliceal stones in the left kidney and a single 4 mm nonobstructive caliceal stone in the inferior pole of the right. No suspicious renal masses seen without contrast, no ureteral stones or urinary obstruction, no bladder thickening. Stomach/Bowel: Large hiatal hernia with intrathoracic stomach. No small bowel dilatation or inflammatory change. Normal caliber appendix. Mild fecal stasis. Diffuse diverticulosis seen heaviest in the sigmoid segment without evidence of diverticulitis. Vascular/Lymphatic: Heavy aortic and branch vessel atherosclerosis. Aortobi-iliac stent graft again noted. Large excluded aneurysm sac is larger than previously, today measuring 10.3 x 7.9 cm on 2:40, previously 8.5 x 6.4 cm. There are no overt adjacent inflammatory changes, no evidence of adjacent hemorrhage. The aneurysm displaces bowel around it. Multiple pelvic phleboliths.  No abdominal or pelvic adenopathy. Reproductive: Uterus and bilateral adnexa are unremarkable. Other: There previously was a left inguinal hernia which appears to have been repaired in the interval. There is no incarcerated hernia. No free fluid, free air or free hemorrhage. There are mild mesenteric congestive features, mild edema along the body wall. Musculoskeletal: Reverse S shaped lower thoracic to lumbar scoliosis noted, osteopenia and advanced degenerative changes of the spine. Moderate hip DJD. No acute or other significant osseous findings. Chronic healed fractures of the right pubic rami. IMPRESSION: 1. No acute intracranial CT findings or interval changes. Chronic changes. 2. Large hiatal hernia  with intrathoracic stomach. 3. Bronchitis.  No focal infiltrate in the imaged lung bases. 4. Constipation  and diverticulosis. 5. Aortobi-iliac stent graft with increased size of the excluded aneurysm sac, today 10.3 x 7.9 cm, previously 8.5 x 6.4 cm. No overt adjacent inflammatory changes or evidence of adjacent hemorrhage. Vascular surgery referral recommended. 6. Nonobstructive nephrolithiasis. 7. Cardiomegaly with aortic and coronary artery atherosclerosis. Prior CABG. 8. Mild mesenteric congestive features and mild edema along the body wall. 9. Appears to have had interval repair of the prior left inguinal hernia. No incarcerated hernia. 10. Osteopenia and degenerative change. 11. Chronic sinus disease. Aortic Atherosclerosis (ICD10-I70.0). Electronically Signed   By: Almira Bar M.D.   On: 12/31/2023 22:59    Microbiology: Results for orders placed or performed during the hospital encounter of 12/31/23  Urine Culture     Status: Abnormal   Collection Time: 12/31/23  4:49 PM   Specimen: Urine, Clean Catch  Result Value Ref Range Status   Specimen Description   Final    URINE, CLEAN CATCH Performed at [facility], [address] [place], [place] [address]    Special Requests   Final    NONE Performed at [facility], [address] [place], [place] [address]    Culture MULTIPLE SPECIES PRESENT, SUGGEST RECOLLECTION (A)  Final   Report Status 01/02/2024 FINAL  Final  Resp panel by RT-PCR (RSV, Flu A&B, Covid) Anterior Nasal Swab     Status: None   Collection Time: 12/31/23  7:42 PM   Specimen: Anterior Nasal Swab  Result Value Ref Range Status   SARS Coronavirus 2 by RT PCR NEGATIVE NEGATIVE Final    Comment: (NOTE) SARS-CoV-2 target nucleic acids are NOT DETECTED.  The SARS-CoV-2 RNA is generally detectable in upper respiratory specimens during the acute phase of infection. The lowest concentration of SARS-CoV-2 viral copies this assay can detect is 138 copies/mL. A negative result does not preclude SARS-Cov-2 infection and should not be used as the sole basis  for treatment or other patient management decisions. A negative result may occur with  improper specimen collection/handling, submission of specimen other than nasopharyngeal swab, presence of viral mutation(s) within the areas targeted by this assay, and inadequate number of viral copies(<138 copies/mL). A negative result must be combined with clinical observations, patient history, and epidemiological information. The expected result is Negative.  Fact Sheet for Patients:  BloggerCourse.com  Fact Sheet for Healthcare Providers:  SeriousBroker.it  This test is no t yet approved or cleared by the [place] FDA and  has been authorized for detection and/or diagnosis of SARS-CoV-2 by FDA under an Emergency Use Authorization (EUA). This EUA will remain  in effect (meaning this test can be used) for the duration of the COVID-19 declaration under Section 564(b)(1) of the Act, 21 U.S.C.section 360bbb-3(b)(1), unless the authorization is terminated  or revoked sooner.       Influenza A by PCR NEGATIVE NEGATIVE Final   Influenza B by PCR NEGATIVE NEGATIVE Final    Comment: (NOTE) The Xpert Xpress SARS-CoV-2/FLU/RSV plus assay is intended as an aid in the diagnosis of influenza from Nasopharyngeal swab specimens and should not be used as a sole basis for treatment. Nasal washings and aspirates are unacceptable for Xpert Xpress SARS-CoV-2/FLU/RSV testing.  Fact Sheet for Patients: BloggerCourse.com  Fact Sheet for Healthcare Providers: SeriousBroker.it  This test is not yet approved or cleared by the [place] FDA and has been authorized for detection and/or diagnosis of SARS-CoV-2  by FDA under an Emergency Use Authorization (EUA). This EUA will remain in effect (meaning this test can be used) for the duration of the COVID-19 declaration under Section 564(b)(1) of the Act, 21  U.S.C. section 360bbb-3(b)(1), unless the authorization is terminated or revoked.     Resp Syncytial Virus by PCR NEGATIVE NEGATIVE Final    Comment: (NOTE) Fact Sheet for Patients: BloggerCourse.com  Fact Sheet for Healthcare Providers: SeriousBroker.it  This test is not yet approved or cleared by the [place] FDA and has been authorized for detection and/or diagnosis of SARS-CoV-2 by FDA under an Emergency Use Authorization (EUA). This EUA will remain in effect (meaning this test can be used) for the duration of the COVID-19 declaration under Section 564(b)(1) of the Act, 21 U.S.C. section 360bbb-3(b)(1), unless the authorization is terminated or revoked.  Performed at [facility], [address] [place], [place] [address]     Labs: CBC: Recent Labs  Lab 12/31/23 1645 01/01/24 0600 01/02/24 0829 01/03/24 0502 01/04/24 0418  WBC 6.8 8.1 7.9 6.3 6.8  NEUTROABS 5.1  --  5.9 4.5 5.1  HGB 11.8* 11.0* 11.5* 10.2* 10.7*  HCT 36.8 33.4* 35.1* 31.2* 33.2*  MCV 106.4* 102.5* 104.5* 103.0* 105.4*  PLT 150 144* 146* 139* 141*   Basic Metabolic Panel: Recent Labs  Lab 01/01/24 1841 01/02/24 0829 01/03/24 0502 01/04/24 0418 01/05/24 0444  NA 139 140 140 137 137  K 5.8* 5.0 4.9 4.7 4.5  CL 107 111 110 106 107  CO2 20* 20* 23 22 22   GLUCOSE 122* 99 97 92 101*  BUN 45* 39* 44* 45* 41*  CREATININE 1.91* 1.81* 1.94* 1.94* 2.02*  CALCIUM 8.9 9.0 8.6* 8.6* 8.4*   Liver Function Tests: Recent Labs  Lab 12/31/23 1645  AST 22  ALT 21  ALKPHOS 98  BILITOT 1.3*  PROT 7.8  ALBUMIN 4.3   CBG: No results for input(s): "GLUCAP" in the last 168 hours.  Discharge time spent:  35 minutes.  Signed: Loyce Dys, MD [facility] 01/05/2024

## 2024-01-05 NOTE — TOC Transition Note (Addendum)
Transition of Care [facility]) - Discharge Note   Patient Details  Name: Claire Kelley MRN: [id number] Date of Birth: 1926-03-31  [facility] CM/SW Contact:  Allena Katz, LCSW Phone Number: 01/05/2024, 10:12 AM   Clinical Narrative:   [facility] RN given number for report. DC summary sent. Tammy notified. Daughter [name] notified and will transport around 12-12:30. DNR signed.  Final next level of care: Skilled Nursing Facility Barriers to Discharge: Barriers Resolved   Patient Goals and CMS Choice Patient states their goals for this hospitalization and ongoing recovery are:: [facility] to [facility] CMS Medicare.gov Compare Post Acute Care list provided to:: Patient        Discharge Placement                Patient to be transferred to facility by: daughter [name] Name of family member notified: [name] Patient and family notified of of transfer: 01/05/24  Discharge Plan and Services Additional resources added to the After Visit Summary for                                       Social Drivers of Health (SDOH) Interventions SDOH Screenings   Food Insecurity: No Food Insecurity (01/02/2024)  Housing: Low Risk  (01/02/2024)  Transportation Needs: No Transportation Needs (01/02/2024)  Utilities: Not At Risk (01/02/2024)  Financial Resource Strain: Low Risk  (04/29/2023)   Received from [facility], [facility]  Social Connections: Moderately Isolated (01/02/2024)  Tobacco Use: Medium Risk (01/02/2024)     Readmission Risk Interventions     No data to display

## 2024-02-02 ENCOUNTER — Other Ambulatory Visit: Payer: Self-pay

## 2024-02-02 ENCOUNTER — Inpatient Hospital Stay
Admission: EM | Admit: 2024-02-02 | Discharge: 2024-02-08 | DRG: 064 | Disposition: A | Discharge status: Hospice | Payer: Medicare Other | Attending: Internal Medicine | Admitting: Internal Medicine

## 2024-02-02 ENCOUNTER — Inpatient Hospital Stay: Payer: Medicare Other

## 2024-02-02 ENCOUNTER — Encounter: Payer: Self-pay | Admitting: Emergency Medicine

## 2024-02-02 ENCOUNTER — Emergency Department: Payer: Medicare Other

## 2024-02-02 DIAGNOSIS — E871 Hypo-osmolality and hyponatremia: Secondary | ICD-10-CM | POA: Diagnosis present

## 2024-02-02 DIAGNOSIS — Z9181 History of falling: Secondary | ICD-10-CM

## 2024-02-02 DIAGNOSIS — R4182 Altered mental status, unspecified: Secondary | ICD-10-CM | POA: Diagnosis not present

## 2024-02-02 DIAGNOSIS — Z681 Body mass index (BMI) 19 or less, adult: Secondary | ICD-10-CM

## 2024-02-02 DIAGNOSIS — T45515A Adverse effect of anticoagulants, initial encounter: Secondary | ICD-10-CM | POA: Diagnosis present

## 2024-02-02 DIAGNOSIS — Z79899 Other long term (current) drug therapy: Secondary | ICD-10-CM

## 2024-02-02 DIAGNOSIS — L89151 Pressure ulcer of sacral region, stage 1: Secondary | ICD-10-CM | POA: Diagnosis present

## 2024-02-02 DIAGNOSIS — L89899 Pressure ulcer of other site, unspecified stage: Secondary | ICD-10-CM | POA: Diagnosis present

## 2024-02-02 DIAGNOSIS — Z881 Allergy status to other antibiotic agents status: Secondary | ICD-10-CM

## 2024-02-02 DIAGNOSIS — Z888 Allergy status to other drugs, medicaments and biological substances status: Secondary | ICD-10-CM

## 2024-02-02 DIAGNOSIS — E875 Hyperkalemia: Secondary | ICD-10-CM | POA: Diagnosis present

## 2024-02-02 DIAGNOSIS — I619 Nontraumatic intracerebral hemorrhage, unspecified: Secondary | ICD-10-CM | POA: Diagnosis present

## 2024-02-02 DIAGNOSIS — D6832 Hemorrhagic disorder due to extrinsic circulating anticoagulants: Secondary | ICD-10-CM | POA: Diagnosis present

## 2024-02-02 DIAGNOSIS — Z882 Allergy status to sulfonamides status: Secondary | ICD-10-CM

## 2024-02-02 DIAGNOSIS — R636 Underweight: Secondary | ICD-10-CM | POA: Diagnosis present

## 2024-02-02 DIAGNOSIS — Z515 Encounter for palliative care: Secondary | ICD-10-CM | POA: Diagnosis not present

## 2024-02-02 DIAGNOSIS — I340 Nonrheumatic mitral (valve) insufficiency: Secondary | ICD-10-CM | POA: Diagnosis not present

## 2024-02-02 DIAGNOSIS — F419 Anxiety disorder, unspecified: Secondary | ICD-10-CM | POA: Diagnosis present

## 2024-02-02 DIAGNOSIS — I614 Nontraumatic intracerebral hemorrhage in cerebellum: Principal | ICD-10-CM

## 2024-02-02 DIAGNOSIS — G9341 Metabolic encephalopathy: Secondary | ICD-10-CM | POA: Diagnosis present

## 2024-02-02 DIAGNOSIS — Z66 Do not resuscitate: Secondary | ICD-10-CM | POA: Diagnosis present

## 2024-02-02 DIAGNOSIS — I5032 Chronic diastolic (congestive) heart failure: Secondary | ICD-10-CM | POA: Diagnosis present

## 2024-02-02 DIAGNOSIS — I482 Chronic atrial fibrillation, unspecified: Secondary | ICD-10-CM | POA: Diagnosis present

## 2024-02-02 DIAGNOSIS — Z951 Presence of aortocoronary bypass graft: Secondary | ICD-10-CM

## 2024-02-02 DIAGNOSIS — Z8249 Family history of ischemic heart disease and other diseases of the circulatory system: Secondary | ICD-10-CM

## 2024-02-02 DIAGNOSIS — L8990 Pressure ulcer of unspecified site, unspecified stage: Secondary | ICD-10-CM | POA: Insufficient documentation

## 2024-02-02 DIAGNOSIS — I130 Hypertensive heart and chronic kidney disease with heart failure and stage 1 through stage 4 chronic kidney disease, or unspecified chronic kidney disease: Secondary | ICD-10-CM | POA: Diagnosis present

## 2024-02-02 DIAGNOSIS — E7800 Pure hypercholesterolemia, unspecified: Secondary | ICD-10-CM | POA: Diagnosis present

## 2024-02-02 DIAGNOSIS — I2510 Atherosclerotic heart disease of native coronary artery without angina pectoris: Secondary | ICD-10-CM | POA: Diagnosis present

## 2024-02-02 DIAGNOSIS — N179 Acute kidney failure, unspecified: Secondary | ICD-10-CM | POA: Diagnosis present

## 2024-02-02 DIAGNOSIS — D696 Thrombocytopenia, unspecified: Secondary | ICD-10-CM | POA: Diagnosis present

## 2024-02-02 DIAGNOSIS — K219 Gastro-esophageal reflux disease without esophagitis: Secondary | ICD-10-CM | POA: Diagnosis present

## 2024-02-02 DIAGNOSIS — Z95810 Presence of automatic (implantable) cardiac defibrillator: Secondary | ICD-10-CM

## 2024-02-02 DIAGNOSIS — N184 Chronic kidney disease, stage 4 (severe): Secondary | ICD-10-CM | POA: Diagnosis present

## 2024-02-02 DIAGNOSIS — D539 Nutritional anemia, unspecified: Secondary | ICD-10-CM | POA: Diagnosis present

## 2024-02-02 DIAGNOSIS — Z87891 Personal history of nicotine dependence: Secondary | ICD-10-CM

## 2024-02-02 DIAGNOSIS — L8995 Pressure ulcer of unspecified site, unstageable: Secondary | ICD-10-CM | POA: Diagnosis not present

## 2024-02-02 DIAGNOSIS — Z7901 Long term (current) use of anticoagulants: Secondary | ICD-10-CM

## 2024-02-02 DIAGNOSIS — R627 Adult failure to thrive: Secondary | ICD-10-CM | POA: Diagnosis present

## 2024-02-02 LAB — GLUCOSE, CAPILLARY: Glucose-Capillary: 100 mg/dL — ABNORMAL HIGH (ref 70–99)

## 2024-02-02 LAB — PROTIME-INR
INR: 1.2 (ref 0.8–1.2)
Prothrombin Time: 15 s (ref 11.4–15.2)

## 2024-02-02 LAB — CBC
HCT: 30.3 % — ABNORMAL LOW (ref 36.0–46.0)
Hemoglobin: 9.8 g/dL — ABNORMAL LOW (ref 12.0–15.0)
MCH: 34 pg (ref 26.0–34.0)
MCHC: 32.3 g/dL (ref 30.0–36.0)
MCV: 105.2 fL — ABNORMAL HIGH (ref 80.0–100.0)
Platelets: 138 10*3/uL — ABNORMAL LOW (ref 150–400)
RBC: 2.88 MIL/uL — ABNORMAL LOW (ref 3.87–5.11)
RDW: 13.2 % (ref 11.5–15.5)
WBC: 8.9 10*3/uL (ref 4.0–10.5)
nRBC: 0 % (ref 0.0–0.2)

## 2024-02-02 LAB — HEPARIN LEVEL (UNFRACTIONATED): Heparin Unfractionated: >1.1 [IU]/mL — ABNORMAL HIGH (ref 0.30–0.70)

## 2024-02-02 LAB — COMPREHENSIVE METABOLIC PANEL
ALT: 14 U/L (ref 0–44)
AST: 17 U/L (ref 15–41)
Albumin: 3.8 g/dL (ref 3.5–5.0)
Alkaline Phosphatase: 104 U/L (ref 38–126)
Anion gap: 13 (ref 5–15)
BUN: 38 mg/dL — ABNORMAL HIGH (ref 8–23)
CO2: 23 mmol/L (ref 22–32)
Calcium: 9 mg/dL (ref 8.9–10.3)
Chloride: 97 mmol/L — ABNORMAL LOW (ref 98–111)
Creatinine, Ser: 2.22 mg/dL — ABNORMAL HIGH (ref 0.44–1.00)
GFR, Estimated: 20 mL/min — ABNORMAL LOW (ref >60)
Glucose, Bld: 112 mg/dL — ABNORMAL HIGH (ref 70–99)
Potassium: 5.2 mmol/L — ABNORMAL HIGH (ref 3.5–5.1)
Sodium: 133 mmol/L — ABNORMAL LOW (ref 135–145)
Total Bilirubin: 1.1 mg/dL (ref 0.0–1.2)
Total Protein: 6.9 g/dL (ref 6.5–8.1)

## 2024-02-02 LAB — TYPE AND SCREEN
ABO/RH(D): O POS
Antibody Screen: NEGATIVE

## 2024-02-02 LAB — APTT: aPTT: 37 s — ABNORMAL HIGH (ref 24–36)

## 2024-02-02 MED ORDER — HALOPERIDOL LACTATE 5 MG/ML IJ SOLN
2.0000 mg | Freq: Once | INTRAMUSCULAR | Status: AC
  Administered 2024-02-02: 2 mg via INTRAVENOUS
  Filled 2024-02-02: qty 1

## 2024-02-02 MED ORDER — FENTANYL CITRATE PF 50 MCG/ML IJ SOSY
25.0000 ug | PREFILLED_SYRINGE | INTRAMUSCULAR | Status: DC | PRN
  Administered 2024-02-02 – 2024-02-03 (×3): 25 ug via INTRAVENOUS
  Filled 2024-02-02 (×4): qty 1

## 2024-02-02 MED ORDER — EMPTY CONTAINERS FLEXIBLE MISC
900.0000 mg | Freq: Once | Status: AC
  Administered 2024-02-02: 900 mg via INTRAVENOUS
  Filled 2024-02-02: qty 90

## 2024-02-02 MED ORDER — FENTANYL CITRATE PF 50 MCG/ML IJ SOSY
25.0000 ug | PREFILLED_SYRINGE | Freq: Once | INTRAMUSCULAR | Status: AC
  Administered 2024-02-02: 25 ug via INTRAVENOUS

## 2024-02-02 MED ORDER — DEXMEDETOMIDINE HCL IN NACL 400 MCG/100ML IV SOLN
0.0000 ug/kg/h | INTRAVENOUS | Status: DC
  Administered 2024-02-02 (×2): 0.6 ug/kg/h via INTRAVENOUS
  Administered 2024-02-02: 1 ug/kg/h via INTRAVENOUS
  Administered 2024-02-03 (×2): 1.2 ug/kg/h via INTRAVENOUS
  Filled 2024-02-02 (×4): qty 100

## 2024-02-02 MED ORDER — FENTANYL CITRATE PF 50 MCG/ML IJ SOSY
25.0000 ug | PREFILLED_SYRINGE | Freq: Once | INTRAMUSCULAR | Status: AC
  Administered 2024-02-02: 25 ug via INTRAVENOUS
  Filled 2024-02-02: qty 1

## 2024-02-02 MED ORDER — ACETAMINOPHEN 650 MG RE SUPP: 650.0000 mg | RECTAL | Status: DC | PRN

## 2024-02-02 MED ORDER — ACETAMINOPHEN 325 MG PO TABS: 650.0000 mg | ORAL_TABLET | ORAL | Status: DC | PRN

## 2024-02-02 MED ORDER — STROKE: EARLY STAGES OF RECOVERY BOOK: Freq: Once | Status: AC

## 2024-02-02 MED ORDER — SENNOSIDES-DOCUSATE SODIUM 8.6-50 MG PO TABS
1.0000 | ORAL_TABLET | Freq: Two times a day (BID) | ORAL | Status: DC
  Administered 2024-02-03 – 2024-02-07 (×6): 1 via ORAL
  Filled 2024-02-02 (×6): qty 1

## 2024-02-02 MED ORDER — LABETALOL HCL 5 MG/ML IV SOLN
10.0000 mg | INTRAVENOUS | Status: DC | PRN
  Administered 2024-02-03 – 2024-02-05 (×4): 10 mg via INTRAVENOUS
  Filled 2024-02-02 (×4): qty 4

## 2024-02-02 MED ORDER — ACETAMINOPHEN 160 MG/5ML PO SOLN: 650.0000 mg | ORAL | Status: DC | PRN

## 2024-02-02 MED ORDER — PANTOPRAZOLE SODIUM 40 MG IV SOLR
40.0000 mg | Freq: Every day | INTRAVENOUS | Status: DC
  Administered 2024-02-02 – 2024-02-07 (×6): 40 mg via INTRAVENOUS
  Filled 2024-02-02 (×5): qty 10

## 2024-02-02 MED ORDER — DEXMEDETOMIDINE HCL IN NACL 400 MCG/100ML IV SOLN
INTRAVENOUS | Status: AC
  Filled 2024-02-02: qty 100

## 2024-02-02 MED ORDER — LACTATED RINGERS IV BOLUS
500.0000 mL | Freq: Once | INTRAVENOUS | Status: AC
Start: 2024-02-02 — End: 2024-02-02
  Administered 2024-02-02: 500 mL via INTRAVENOUS

## 2024-02-02 MED ORDER — CHLORHEXIDINE GLUCONATE CLOTH 2 % EX PADS
6.0000 | MEDICATED_PAD | Freq: Every day | CUTANEOUS | Status: DC
  Administered 2024-02-02 – 2024-02-07 (×6): 6 via TOPICAL
  Filled 2024-02-02: qty 6

## 2024-02-02 NOTE — ED Triage Notes (Signed)
Patient BIB [facility] from [facility] where family and staff report an increase in altered mental status. Per EMS staff found patients room to be scattered, she is Aox2. They feel that this increased confusion may be a result of a UTI, as this typically coincides together in past times. Patient did have a fall today that was unwitnessed at 1830 tonight. On eliquis but unsure if hit head. No trauma noted. Per EMS the patient did complain of flank pain 2 days ago.

## 2024-02-02 NOTE — Progress Notes (Addendum)
ICU 18 - Samadhi Shadrick (NO CHARGE SAME DAY NOTE)  Telespecialists overnight: 89yo woman with history of HTN, afib on eliquis, AAA, CHF, CKD4, CAD, DVT who presented with increased confusion. NCCT with small midline cerebellar IPH, likely hypertensive in etiology, eliquis reversed with andexxa. Recommend stat neurosurgery consult, repeat CTH in 8-12 hrs, monitor in ICU overnight with q1hr neurochecks. Suggest MRI brain w/o along with MRA head w/o to assess for amyloid angiopathy but this is not urgent. Suggest evaluation for placement of a watchman once she is out of the acute phase as this would allow patient to get off of [facility] permanently.   Neurosurgery: Ms. Klecker is a pleasant 89 y.o. female with history of A-fib on Eliquis with history of multiple falls and at least 1 months progressive decline of sensorium.  She had either a fall or a found down event after which she was having worsening confusion so she was brought to the emergency department.  She had a nonfocal examination but was having some disorientation.  CT head and CT C-spine were ordered.  In regards to the CT head there is a small vermian bleed which would be a unlikely place for a traumatic hemorrhage, however it does remain on the differential.  Given her Eliquis exposure, would recommend a repeat head CT in 6 hours is also subjectives by our neurology team.  Also agree with an MRI to rule out amyloid angiopathy or any mass lesion.  In regards to the ICH or stroke score is 2 for her age and infratentorial origin.  In regards to her cervical spine, she has no cervical tenderness, her CT C-spine is stable without any evidence of fracture or malalignment.  She does not require cervical spinal collar.  We will follow-up on her head CT, should this be stable, she can follow-up with neurology for the nonoperative intracranial hemorrhage, should there be any expansion please contact neurosurgery and we will be happy to give further  recommendations.  Given its infratentorial origin as well as her age would not recommend antiepileptics.  Head CT 0900 1. Slight enlargement of a small acute hemorrhage in the cerebellar vermis, now 7 mm. No significant edema or mass effect. 2. No new intracranial abnormality. 3. Moderate chronic small vessel ischemic disease.  Plan was discussed with both the patient and her family member at bedside.  They did acknowledge that she would not want any neurosurgical intervention even if it was indicated on the repeat scan  Impression:   # pmhx a fib previously on eliquis # small cerebellar vermis ICH post-fall - Eliquis reversed - Supportive care - No operative intervention planned - Consult to palliative care  Neurology will continue to follow.  Bing Neighbors, MD [facility] [phone]  If 7pm- 7am, please page neurology on call as listed in AMION.

## 2024-02-02 NOTE — Progress Notes (Signed)
eLink Physician-Brief Progress Note Patient Name: Claire [place] DOB: May 06, 1926 MRN: [id number]   Date of Service  02/02/2024  HPI/Events of Note  89 year old female with a history of atrial fibrillation and coronary artery disease on chronic Eliquis therapy who presents to the emergency department with a cerebellar hemorrhage status post Andexxa.  ICH score 2  On examination, vitals.  Saturating 94% on 3 L of oxygen.  Results show elevated creatinine, macrocytic anemia, mild thrombocytopenia.    eICU Interventions  Status post reversal of her Eliquis.  Interval CT 6 hours per neurology, MRI imaging  Strict blood pressure control, maintain systolic less than 150  DVT prophy with SCDs chemoprophylaxis contraindicated with ICH GI prophylaxis with pantoprazole     Intervention Category Evaluation Type: New Patient Evaluation  Raunel Dimartino 02/02/2024, 6:45 AM

## 2024-02-02 NOTE — Progress Notes (Signed)
SLP Cancellation Note  Patient Details Name: Armonee Bojanowski MRN: [id number] DOB: [birth year]   Cancelled treatment:       Reason Eval/Treat Not Completed: Fatigue/lethargy limiting ability to participate;Medical issues which prohibited therapy  Pt's daughters at bedside. Pt sleeping. They report that pt had "just gotten settled" had been very agitated and fidgety prior to medication administration. Decision made to allow pt to rest. ST to continue to follow.   Jontae Sonier B. Dreama Saa, M.S., CCC-SLP, CBIS Speech-Language Pathologist Certified Brain Injury Specialist [facility] [phone] Ascom [phone] Fax [phone]  Kainoah Bartosiewicz Dreama Saa 02/02/2024, 11:15 AM

## 2024-02-02 NOTE — ED Provider Notes (Addendum)
[facility] Provider Note    Event Date/Time   First MD Initiated Contact with Patient 02/02/24 [phone]     (approximate)   History   Altered Mental Status   HPI  Claire Kelley is a 89 y.o. female   Past medical history of atrial fibrillation on Eliquis, hypertension, who presents to the emergency department with altered mental status and a fall earlier today.  She has been deconditioned and not quite her normal mental acuity since her hospitalization in [place] and discharge for AKI, went to rehab, and then back to her living facility last week.  However this morning appeared to be more tired than usual noted by family members who are with her.  Family members are with her all day and then around 6:30 PM they left briefly and found her on the floor next to her bed after they returned.  Unknown mechanism of fall.  Since there was no obvious injury and she continued to act and behave like she was all day, they continue to monitor.  Around midnight she appeared to be acutely more confused.  They then had her brought to the emergency department.  Independent Historian contributed to assessment above: Daughter at bedside gives information as above is the patient is acutely altered and cannot provide much of the history.     Physical Exam   Triage Vital Signs: ED Triage Vitals  Encounter Vitals Group     BP 02/02/24 0228 (!) 192/71     Systolic BP Percentile --      Diastolic BP Percentile --      Pulse Rate 02/02/24 0228 68     Resp 02/02/24 0228 18     Temp 02/02/24 0228 97.9 F (36.6 C)     Temp Source 02/02/24 0228 Oral     SpO2 02/02/24 0228 97 %     Weight 02/02/24 0226 110 lb 3.7 oz (50 kg)     Height 02/02/24 0226 5\' 5"  (1.651 m)     Head Circumference --      Peak Flow --      Pain Score --      Pain Loc --      Pain Education --      Exclude from Growth Chart --     Most recent vital signs: Vitals:   02/02/24 0228  BP: (!) 192/71   Pulse: 68  Resp: 18  Temp: 97.9 F (36.6 C)  SpO2: 97%    General: Awake, no distress.  CV:  Good peripheral perfusion.  Resp:  Normal effort.  Abd:  No distention.  Other:  Mild bruising on extremities, old appearing.  Able to range all extremities with full active range of motion, atraumatic chest, back, abdomen.  Neck supple with full range of motion.  No obvious head injuries.   ED Results / Procedures / Treatments   Labs (all labs ordered are listed, but only abnormal results are displayed) Labs Reviewed  COMPREHENSIVE METABOLIC PANEL - Abnormal; Notable for the following components:      Result Value   Sodium 133 (*)    Potassium 5.2 (*)    Chloride 97 (*)    Glucose, Bld 112 (*)    BUN 38 (*)    Creatinine, Ser 2.22 (*)    GFR, Estimated 20 (*)    All other components within normal limits  CBC - Abnormal; Notable for the following components:   RBC 2.88 (*)    Hemoglobin 9.8 (*)  HCT 30.3 (*)    MCV 105.2 (*)    Platelets 138 (*)    All other components within normal limits  PROTIME-INR  URINALYSIS, ROUTINE W REFLEX MICROSCOPIC  APTT  HEPARIN LEVEL (UNFRACTIONATED)  TYPE AND SCREEN     I ordered and reviewed the above labs they are notable for chronic anemia unchanged from prior.  EKG  ED ECG REPORT I, Pilar Jarvis, the attending physician, personally viewed and interpreted this ECG.   Date: 02/02/2024  EKG Time: 0311  Rate: 69  Rhythm: paced  ST&T Change: no stemi    RADIOLOGY I independently reviewed and interpreted CT of the head see hyperdensity cerebellum concerning for bleeding. I also reviewed radiologist's formal read.   PROCEDURES:  Critical Care performed: Yes, see critical care procedure note(s)  .Critical Care  Performed by: Pilar Jarvis, MD Authorized by: Pilar Jarvis, MD   Critical care provider statement:    Critical care time (minutes):  30   Critical care was time spent personally by me on the following activities:   Development of treatment plan with patient or surrogate, discussions with consultants, evaluation of patient's response to treatment, examination of patient, ordering and review of laboratory studies, ordering and review of radiographic studies, ordering and performing treatments and interventions, pulse oximetry, re-evaluation of patient's condition and review of old charts    MEDICATIONS ORDERED IN ED: Medications  coag fact Xa recombinant (ANDEXXA) low dose infusion 900 mg (has no administration in time range)    External physician / consultants:  I spoke with neurosurgery Dr. Katrinka Blazing and hospital medicine for admission and regarding care plan for this patient.   IMPRESSION / MDM / ASSESSMENT AND PLAN / ED COURSE  I reviewed the triage vital signs and the nursing notes.                                Patient's presentation is most consistent with acute presentation with potential threat to life or bodily function.  Differential diagnosis includes, but is not limited to, stroke, ICH, head injury, infection, metabolic derangement   The patient is on the cardiac monitor to evaluate for evidence of arrhythmia and/or significant heart rate changes.  MDM:    The patient with a noted intraparenchymal bleed in the cerebellum who is on Eliquis.  Her last dose was 2-1/2 mg at 8 PM so I ordered for reversal.  Initially hypertensive in the 190 systolic but on recheck in the 150s.  Will continue to monitor blood pressure closely and medicate as needed.  She is altered but otherwise has no focal neurologic deficits.  She is DNR/DNI confirmed by daughter at bedside.  I consulted neurosurgery as well as teleneurologist stat consult for brain bleeding. Admit  Appreciate neuroconsult recommendations, see their note for full recommendations.  Ensure recommend blood pressure control less than 140 systolic, last check was 120s.  Nonemergent MRI recommended for further evaluation, in her current state will  need sedation and should have discussion with family regarding if they want her to undergo this test which may require transfer to another facility for MRI compatibility with her AICD.  Not needed emergently in her tenuous state as repeat CT scan scheduled for 6 hours, every hour neurochecks currently ordered, and ICU paged for admission.      FINAL CLINICAL IMPRESSION(S) / ED DIAGNOSES   Final diagnoses:  Cerebellar bleed (HCC)  Altered mental status, unspecified altered mental  status type     Rx / DC Orders   ED Discharge Orders     None        Note:  This document was prepared using Dragon voice recognition software and may include unintentional dictation errors.    Pilar Jarvis, MD 02/02/24 [id number]    Pilar Jarvis, MD 02/02/24 [phone]

## 2024-02-02 NOTE — Consult Note (Signed)
Consulting Department:  Emergency department  Primary Physician:  Eldridge Abrahams, MD  Chief Complaint: Cerebellar ICH  History of Present Illness: 02/02/2024 Claire [place] is a 89 y.o. female who presents with the chief complaint of a cerebellar vermian intraparenchymal hemorrhage.  She has a history of A-fib on Eliquis.  She is been living in her assisted living facility.  She has history of falls and has had a recent at least 1 month progressive decline in her neurologic status.  She was previously admitted and evaluated for delirium.  They did not notice any clear injury or notice a head trauma, she had an unwitnessed fall was found down.  The patient does not remember falling or hitting her head.  No external signs of trauma.  They brought her into the emergency department as she was becoming more agitated.  She was found to have a intraparenchymal hemorrhage in the vermis so neurology and neurosurgery were consulted  Review of Systems:  A 10 point review of systems is negative, except for the pertinent positives and negatives detailed in the HPI.  Past Medical History: Past Medical History:  Diagnosis Date   A-fib (HCC)    AICD (automatic cardioverter/defibrillator) present    Coronary artery disease    Diverticulosis    GERD (gastroesophageal reflux disease)    GERD (gastroesophageal reflux disease)    High cholesterol    Hypertension    Renal disorder    CKD    Past Surgical History: Past Surgical History:  Procedure Laterality Date   ABDOMINAL AORTIC ANEURYSM REPAIR     CORONARY ARTERY BYPASS GRAFT     PACEMAKER PLACEMENT      Allergies: Allergies as of 02/02/2024 - Reviewed 02/02/2024  Allergen Reaction Noted   Bactrim [sulfamethoxazole-trimethoprim] Other (See Comments) 05/27/2015   Ciprocinonide [fluocinolone] Other (See Comments) 05/27/2015   Ciprofloxacin Other (See Comments) 09/23/2018   Codeine  07/21/2017   Lipitor [atorvastatin] Other (See Comments)  05/27/2015   Macrobid [nitrofurantoin monohyd macro] Other (See Comments) 05/27/2015   Paxil [paroxetine hcl] Other (See Comments) 05/27/2015    Medications:  Current Facility-Administered Medications:    coag fact Xa recombinant (ANDEXXA) low dose infusion 900 mg, 900 mg, Intravenous, Once, Pilar Jarvis, MD  Current Outpatient Medications:    acetaminophen (TYLENOL) 650 MG CR tablet, Take 1,300 mg by mouth every 8 (eight) hours as needed for pain., Disp: , Rfl:    amiodarone (PACERONE) 200 MG tablet, Take 0.5 tablets by mouth daily., Disp: , Rfl:    Biotin 1000 MCG CHEW, Chew 500 mg by mouth daily., Disp: , Rfl:    cholecalciferol (VITAMIN D) 25 MCG tablet, Take 1 tablet (1,000 Units total) by mouth daily., Disp: 30 tablet, Rfl: 0   Cholecalciferol (VITAMIN D3) 1000 UNITS CAPS, Take 1 capsule by mouth daily., Disp: , Rfl:    Cranberry 500 MG CAPS, Take 1 capsule by mouth daily., Disp: , Rfl:    CRANBERRY-VITAMIN C PO, Take 1 capsule by mouth 2 (two) times daily., Disp: , Rfl:    Cranberry-Vitamin C-Vitamin E 140-100-3 MG-MG-UNIT CAPS, Take by mouth., Disp: , Rfl:    donepezil (ARICEPT) 5 MG tablet, Take by mouth. (Patient not taking: Reported on 01/01/2024), Disp: , Rfl:    ELIQUIS 2.5 MG TABS tablet, Take 2.5 mg by mouth 2 (two) times daily., Disp: , Rfl:    hydrALAZINE (APRESOLINE) 25 MG tablet, Take 25 mg by mouth 2 (two) times daily as needed., Disp: , Rfl:    magnesium  gluconate (MAGONATE) 500 MG tablet, Take 250 mg by mouth 2 (two) times daily., Disp: , Rfl:    melatonin 1 MG TABS tablet, Take 1 tablet by mouth at bedtime as needed., Disp: , Rfl:    metoprolol succinate (TOPROL-XL) 25 MG 24 hr tablet, Take 0.5 tablets (12.5 mg total) by mouth daily., Disp: 30 tablet, Rfl: 0   oxybutynin (DITROPAN-XL) 5 MG 24 hr tablet, Take 1 tablet by mouth at bedtime., Disp: , Rfl:    pantoprazole (PROTONIX) 40 MG tablet, Take 40 mg by mouth daily., Disp: , Rfl:    rosuvastatin (CRESTOR) 5 MG  tablet, Take 5 mg by mouth daily. (Patient not taking: Reported on 01/01/2024), Disp: , Rfl:    senna-docusate (SENOKOT-S) 8.6-50 MG tablet, Take 1 tablet by mouth as needed., Disp: , Rfl:    Social History: Social History   Tobacco Use   Smoking status: Former   Smokeless tobacco: Never  Substance Use Topics   Alcohol use: No   Drug use: Never    Family Medical History: Family History  Problem Relation Age of Onset   Heart disease Sister    Heart disease Brother     Physical Examination: Vitals:   02/02/24 0228  BP: (!) 192/71  Pulse: 68  Resp: 18  Temp: 97.9 F (36.6 C)  SpO2: 97%     General: Patient is well developed, well nourished, calm, collected, and in no apparent distress.  NEUROLOGICAL:  General: In no acute distress.   Awake, alert, oriented to person, unsure of her location or current condition.  Pupils equal round and reactive to light.  Full Facial tone is symmetric.  Moves all 4 extremities without clear deficit  GCS: Her eyes are open, she is able to follow commands in all 4 extremities, she has some confusion, GCS 14.   Bilateral upper and lower extremity sensation is intact to light touch.  Imaging: CT HEAD WO CONTRAST ( ) Result Date: 02/02/2024 CLINICAL DATA:  Head trauma, altered mental status. Unwitnessed fall today. EXAM: CT HEAD WITHOUT CONTRAST CT CERVICAL SPINE WITHOUT CONTRAST TECHNIQUE: Multidetector CT imaging of the head and cervical spine was performed following the standard protocol without intravenous contrast. Multiplanar CT image reconstructions of the cervical spine were also generated. RADIATION DOSE REDUCTION: This exam was performed according to the departmental dose-optimization program which includes automated exposure control, adjustment of the mA and/or kV according to patient size and/or use of iterative reconstruction technique. COMPARISON:  None Available. FINDINGS: CT HEAD FINDINGS Brain: Small focus of intraparenchymal  hemorrhage in the cerebellar vermis measuring 4 mm (series 2/image 8). No significant mass effect. No evidence of acute infarct. No hydrocephalus. No extra-axial fluid collection. Age-commensurate cerebral atrophy and chronic small vessel ischemic disease. Vascular: No hyperdense vessel. Intracranial arterial calcification. Skull: No fracture or focal lesion. Sinuses/Orbits: No acute finding. Bony changes from chronic maxillary and sphenoid sinusitis. No mastoid effusion Other: None. CT CERVICAL SPINE FINDINGS Alignment: No evidence of traumatic listhesis. Skull base and vertebrae: No acute fracture. Soft tissues and spinal canal: No prevertebral fluid or swelling. No visible canal hematoma. Disc levels: Multilevel age commensurate spondylosis. No severe spinal canal narrowing. Upper chest: No acute abnormality. Other: Carotid calcification. IMPRESSION: 1. Small focus of intraparenchymal hemorrhage in the cerebellar vermis measuring 4 mm. No significant mass effect. 2. No acute fracture in the cervical spine. Critical Value/emergent results were called by telephone at the time of interpretation on 02/02/2024 at 3:07 am to provider [facility] , who  verbally acknowledged these results. Electronically Signed   By: Minerva Fester M.D.   On: 02/02/2024 03:08   CT Cervical Spine Wo Contrast Result Date: 02/02/2024 CLINICAL DATA:  Head trauma, altered mental status. Unwitnessed fall today. EXAM: CT HEAD WITHOUT CONTRAST CT CERVICAL SPINE WITHOUT CONTRAST TECHNIQUE: Multidetector CT imaging of the head and cervical spine was performed following the standard protocol without intravenous contrast. Multiplanar CT image reconstructions of the cervical spine were also generated. RADIATION DOSE REDUCTION: This exam was performed according to the departmental dose-optimization program which includes automated exposure control, adjustment of the mA and/or kV according to patient size and/or use of iterative reconstruction  technique. COMPARISON:  None Available. FINDINGS: CT HEAD FINDINGS Brain: Small focus of intraparenchymal hemorrhage in the cerebellar vermis measuring 4 mm (series 2/image 8). No significant mass effect. No evidence of acute infarct. No hydrocephalus. No extra-axial fluid collection. Age-commensurate cerebral atrophy and chronic small vessel ischemic disease. Vascular: No hyperdense vessel. Intracranial arterial calcification. Skull: No fracture or focal lesion. Sinuses/Orbits: No acute finding. Bony changes from chronic maxillary and sphenoid sinusitis. No mastoid effusion Other: None. CT CERVICAL SPINE FINDINGS Alignment: No evidence of traumatic listhesis. Skull base and vertebrae: No acute fracture. Soft tissues and spinal canal: No prevertebral fluid or swelling. No visible canal hematoma. Disc levels: Multilevel age commensurate spondylosis. No severe spinal canal narrowing. Upper chest: No acute abnormality. Other: Carotid calcification. IMPRESSION: 1. Small focus of intraparenchymal hemorrhage in the cerebellar vermis measuring 4 mm. No significant mass effect. 2. No acute fracture in the cervical spine. Critical Value/emergent results were called by telephone at the time of interpretation on 02/02/2024 at 3:07 am to provider [facility] , who verbally acknowledged these results. Electronically Signed   By: Minerva Fester M.D.   On: 02/02/2024 03:08     I have personally reviewed the images and agree with the above interpretation.  Personal interpretation demonstrates a small vermian bleed with low likelihood of need for intervention surgically.  CT spine was also evaluated with no evidence of acute fractures or malalignment.  Labs:    Latest Ref Rng & Units 02/02/2024    2:38 AM 01/04/2024    4:18 AM 01/03/2024    5:02 AM  CBC  WBC 4.0 - 10.5 K/uL 8.9  6.8  6.3   Hemoglobin 12.0 - 15.0 g/dL 9.8  91.4  78.2   Hematocrit 36.0 - 46.0 % 30.3  33.2  31.2   Platelets 150 - 400 K/uL 138  141  139        Latest Ref Rng & Units 02/02/2024    2:38 AM 01/05/2024    4:44 AM 01/04/2024    4:18 AM  BMP  Glucose 70 - 99 mg/dL 956  213  92   BUN 8 - 23 mg/dL 38  41  45   Creatinine 0.44 - 1.00 mg/dL 0.86  5.78  4.69   Sodium 135 - 145 mmol/L 133  137  137   Potassium 3.5 - 5.1 mmol/L 5.2  4.5  4.7   Chloride 98 - 111 mmol/L 97  107  106   CO2 22 - 32 mmol/L 23  22  22    Calcium 8.9 - 10.3 mg/dL 9.0  8.4  8.6     INR  1.2 (02/14 0238)   Assessment and Plan: Ms. Mousel is a pleasant 89 y.o. female with history of A-fib on Eliquis with history of multiple falls and at least 1 months progressive decline of  sensorium.  She had either a fall or a found down event after which she was having worsening confusion so she was brought to the emergency department.  She had a nonfocal examination but was having some disorientation.  CT head and CT C-spine were ordered.  In regards to the CT head there is a small vermian bleed which would be a unlikely place for a traumatic hemorrhage, however it does remain on the differential.  Given her Eliquis exposure, would recommend a repeat head CT in 6 hours is also subjectives by our neurology team.  Also agree with an MRI to rule out amyloid angiopathy or any mass lesion.  In regards to the ICH or stroke score is 2 for her age and infratentorial origin.  In regards to her cervical spine, she has no cervical tenderness, her CT C-spine is stable without any evidence of fracture or malalignment.  She does not require cervical spinal collar.  We will follow-up on her head CT, should this be stable, she can follow-up with neurology for the nonoperative intracranial hemorrhage, should there be any expansion please contact neurosurgery and we will be happy to give further recommendations.  Given its infratentorial origin as well as her age would not recommend antiepileptics.  Plan was discussed with both the patient and her family member at bedside.  They did acknowledge  that she would not want any neurosurgical intervention even if it was indicated on the repeat scan  Lovenia Kim, MD/MSCR Dept. of Neurosurgery

## 2024-02-02 NOTE — H&P (Signed)
NAME:  Claire Kelley, MRN:  [id number], DOB:  1926-06-15, LOS: 0 ADMISSION DATE:  02/02/2024    BRIEF SYNOPSIS ICU admission for ICH   History of Present Illness:  89 y.o. female who presents with the chief complaint of a cerebellar  intraparenchymal hemorrhage.    She has a history of A-fib on Eliquis.   She is been living in her assisted living facility.   She has history of falls and has had a recent at least 1 month progressive decline in her neurologic status.   She was previously admitted and evaluated for delirium.   They did not notice any clear injury or notice a head trauma, she had an unwitnessed fall was found down.    The patient does not remember falling or hitting her head.  No external signs of trauma.  They brought her into the emergency department as she was becoming more agitated.  She was found to have a intraparenchymal hemorrhage in the vermis so neurology and neurosurgery were consulted   PCCM consulted for ICU admission for aggressive NEURO CHECKS  She is DNR/DNI confirmed by Claire Kelley at bedside with [name]    Significant Hospital Events: Including procedures, antibiotic start and stop dates in addition to other pertinent events   ICU admission for ICH      Interim History / Subjective:  ER NIH STROKE SCALE 2 PER TELENEUROLOGY     Objective   Blood pressure 130/77, pulse 68, temperature 97.9 F (36.6 C), temperature source Oral, resp. rate 18, height 5\' 5"  (1.651 m), weight 50 kg, SpO2 93%.       No intake or output data in the 24 hours ending 02/02/24 0547 Filed Weights   02/02/24 0226  Weight: 50 kg      Labs/imaging that I havepersonally reviewed  (right click and "Reselect all SmartList Selections" daily)      ASSESSMENT AND PLAN SYNOPSIS  ACUTE ICH possible due to trauma and on  [facility] for AFIB Follow up Neuro and Neuro Sx recs  Hold antiplatelet therapy/NSAIDS/Anticoagulation Warfarin/Coumadin/DOAC reversal per hospital  protocol   Telemetry, IV Fluids. Avoid dextrose containing fluids, Maintain euglycemia. Head of bed 30 degrees Neuro checks q1 during ICU stay Once stable neuro checks q4 hrs BP control, lower SBP to less than 160 with goal of 140's   Consultations : Need Neurosurgery consultation STAT Recommend Speech therapy if failed dysphagia screen Physical therapy/Occupational therapy    CARDIAC  Check ECHO    CARDIAC ICU monitoring   ACUTE RENAL FAILURE -continue Foley Catheter-assess need -Avoid nephrotoxic agents -Follow urine output, BMP -Ensure adequate renal perfusion, optimize oxygenation -Renal dose medications  No intake or output data in the 24 hours ending 02/02/24 0547    Latest Ref Rng & Units 02/02/2024    2:38 AM 01/05/2024    4:44 AM 01/04/2024    4:18 AM  BMP  Glucose 70 - 99 mg/dL 409  811  92   BUN 8 - 23 mg/dL 38  41  45   Creatinine 0.44 - 1.00 mg/dL 9.14  7.82  9.56   Sodium 135 - 145 mmol/L 133  137  137   Potassium 3.5 - 5.1 mmol/L 5.2  4.5  4.7   Chloride 98 - 111 mmol/L 97  107  106   CO2 22 - 32 mmol/L 23  22  22    Calcium 8.9 - 10.3 mg/dL 9.0  8.4  8.6      NEUROLOGY Acute  metabolic  encephalopathy Avoid sedatives for now  ENDO - ICU hypoglycemic\Hyperglycemia protocol -check FSBS per protocol   GI GI PROPHYLAXIS as indicated  NUTRITIONAL STATUS DIET-->NPO Constipation protocol as indicated   ELECTROLYTES -follow labs as needed -replace as needed -pharmacy consultation and following   RESTRICTIVE TRANSFUSION PROTOCOL TRANSFUSION  IF HGB<7  or ACTIVE BLEEDING OR DX of ACUTE CORONARY SYNDROMES     Best practice (right click and "Reselect all SmartList Selections" daily)  Diet: NPO  Mobility:  bed rest  Code Status: DNR/DNI Disposition:ICU  Labs   CBC: Recent Labs  Lab 02/02/24 0238  WBC 8.9  HGB 9.8*  HCT 30.3*  MCV 105.2*  PLT 138*    Basic Metabolic Panel: Recent Labs  Lab 02/02/24 0238  NA 133*  K 5.2*   CL 97*  CO2 23  GLUCOSE 112*  BUN 38*  CREATININE 2.22*  CALCIUM 9.0   GFR: Estimated Creatinine Clearance: 12 mL/min (A) (by C-G formula based on SCr of 2.22 mg/dL (H)). Recent Labs  Lab 02/02/24 0238  WBC 8.9    Liver Function Tests: Recent Labs  Lab 02/02/24 0238  AST 17  ALT 14  ALKPHOS 104  BILITOT 1.1  PROT 6.9  ALBUMIN 3.8   No results for input(s): "LIPASE", "AMYLASE" in the last 168 hours. No results for input(s): "AMMONIA" in the last 168 hours.  ABG No results found for: "PHART", "PCO2ART", "PO2ART", "HCO3", "TCO2", "ACIDBASEDEF", "O2SAT"   Coagulation Profile: Recent Labs  Lab 02/02/24 0238  INR 1.2    Cardiac Enzymes: No results for input(s): "CKTOTAL", "CKMB", "CKMBINDEX", "TROPONINI" in the last 168 hours.  HbA1C: No results found for: "HGBA1C"  CBG: No results for input(s): "GLUCAP" in the last 168 hours.   Past Medical History:  She,  has a past medical history of A-fib (HCC), AICD (automatic cardioverter/defibrillator) present, Coronary artery disease, Diverticulosis, GERD (gastroesophageal reflux disease), GERD (gastroesophageal reflux disease), High cholesterol, Hypertension, and Renal disorder.   Surgical History:   Past Surgical History:  Procedure Laterality Date   ABDOMINAL AORTIC ANEURYSM REPAIR     CORONARY ARTERY BYPASS GRAFT     PACEMAKER PLACEMENT       Social History:   reports that she has quit smoking. She has never used smokeless tobacco. She reports that she does not drink alcohol and does not use drugs.   Family History:  Her family history includes Heart disease in her brother and sister.   Allergies Allergies  Allergen Reactions   Bactrim [Sulfamethoxazole-Trimethoprim] Other (See Comments)    Reaction:  Unknown    Ciprocinonide [Fluocinolone] Other (See Comments)    Reaction:  Unknown    Ciprofloxacin Other (See Comments)   Codeine    Lipitor [Atorvastatin] Other (See Comments)    Reaction:  Leg  cramps    Macrobid [Nitrofurantoin Monohyd Macro] Other (See Comments)    Reaction:  Unknown    Paxil [Paroxetine Hcl] Other (See Comments)    Reaction:  Made pt "loopy"     Home Medications  Prior to Admission medications   Medication Sig Start Date End Date Taking? Authorizing Provider  acetaminophen (TYLENOL) 650 MG CR tablet Take 1,300 mg by mouth every 8 (eight) hours as needed for pain.    [provider]  amiodarone (PACERONE) 200 MG tablet Take 0.5 tablets by mouth daily. 04/20/20   [provider]  Biotin 1000 MCG CHEW Chew 500 mg by mouth daily.    [provider]  cholecalciferol (VITAMIN D) 25  MCG tablet Take 1 tablet (1,000 Units total) by mouth daily. 10/16/21   Drema Dallas, MD  Cholecalciferol (VITAMIN D3) 1000 UNITS CAPS Take 1 capsule by mouth daily.    [provider]  Cranberry 500 MG CAPS Take 1 capsule by mouth daily.    [provider]  CRANBERRY-VITAMIN C PO Take 1 capsule by mouth 2 (two) times daily.    [provider]  Cranberry-Vitamin C-Vitamin E 140-100-3 MG-MG-UNIT CAPS Take by mouth.    [provider]  donepezil (ARICEPT) 5 MG tablet Take by mouth. Patient not taking: Reported on 01/01/2024 08/16/18   [provider]  ELIQUIS 2.5 MG TABS tablet Take 2.5 mg by mouth 2 (two) times daily.    [provider]  hydrALAZINE (APRESOLINE) 25 MG tablet Take 25 mg by mouth 2 (two) times daily as needed. 11/20/23 11/19/24  [provider]  magnesium gluconate (MAGONATE) 500 MG tablet Take 250 mg by mouth 2 (two) times daily.    [provider]  melatonin 1 MG TABS tablet Take 1 tablet by mouth at bedtime as needed.    [provider]  metoprolol succinate (TOPROL-XL) 25 MG 24 hr tablet Take 0.5 tablets (12.5 mg total) by mouth daily. 10/16/21   Drema Dallas, MD  oxybutynin (DITROPAN-XL) 5 MG 24 hr tablet Take 1 tablet by mouth at bedtime. 08/25/21   [provider]  pantoprazole (PROTONIX) 40 MG tablet Take 40 mg by mouth daily.    [provider]  rosuvastatin (CRESTOR) 5 MG tablet Take 5 mg by mouth daily. Patient not taking: Reported on 01/01/2024    [provider]  senna-docusate (SENOKOT-S) 8.6-50 MG tablet Take 1 tablet by mouth as needed.    [provider]       Critical Care Time devoted to patient care services described in this note is 55 minutes.   Critical care was necessary to treat /prevent imminent and life-threatening deterioration.   PATIENT WITH VERY POOR PROGNOSIS I ANTICIPATE PROLONGED ICU LOS  Patient is critically ill. High risk for cardiac arrest and death.    Lucie Leather, M.D.  [facility]  Medical Director [facility] Medical Director [facility] Cardio-Pulmonary Department

## 2024-02-02 NOTE — ED Notes (Signed)
Please place Telecart #2 in pt's room

## 2024-02-02 NOTE — ED Notes (Signed)
Called Telespecialist spoke with Claire Kelley/ request for Stat Consult

## 2024-02-02 NOTE — Progress Notes (Signed)
PHARMACY CONSULT NOTE - FOLLOW UP  Pharmacy Consult for Electrolyte Monitoring and Replacement   Recent Labs: Potassium  Date Value  02/02/2024 5.2 mmol/L (H)  01/23/2014 4.4 mmol/L  01/21/2010 4.2 mEq/L   Magnesium (mg/dL)  Date Value  96/29/5284 2.1   Calcium (mg/dL)  Date Value  13/24/4010 9.0  01/21/2010 9.1   Calcium, Total (mg/dL)  Date Value  27/25/3664 8.7   Albumin (g/dL)  Date Value  40/34/7425 3.8  01/21/2010 3.5   Phosphorus (mg/dL)  Date Value  95/63/8756 3.1   Sodium  Date Value  02/02/2024 133 mmol/L (L)  01/23/2014 135 mmol/L (L)  01/21/2010 146 mEq/L (H)    Assessment: 89 y.o. female with PMHx significant can't for A.fib on Eliquis, HTN who presented with AMS following a fall at home. Found to have small Cerebellar ICH. Pharmacy is asked to follow and replace electrolytes while in CCU  Goal of Therapy:  Electrolytes WNL  Plan:  ---no electrolyte replacement warranted for today ---recheck electrrolytes in am  Lowella Bandy ,PharmD Clinical Pharmacist 02/02/2024 12:57 PM

## 2024-02-02 NOTE — Progress Notes (Addendum)
Patient remained agitated and combative all day. Hit arms on side rails until left IV was dislodged which developed a skin tear at left  wrist. Also created herself a skin tear on the right forearm  above the IV site. Speech was garbled all day and she was unable to communicate.Refused to keep SCDs on and the SCDs caused her to be more agitated.  Sedated with Precedex for repeat CT scan. Precedex discontinued when her blood pressure dropped. Given 2 doses of Fentanyl late in the day for her agitation. Daughters here all day but they could not comfort their mom. Rough day foer patient.

## 2024-02-02 NOTE — H&P (Signed)
NAME:  Claire Kelley, MRN:  [id number], DOB:  1926/08/18, LOS: 0 ADMISSION DATE:  02/02/2024, CONSULTATION DATE:  02/02/24 REFERRING MD:  Dr. Modesto Charon, CHIEF COMPLAINT:  Altered Mental Status following fall  Brief Pt Description / Synopsis:  89 y.o. female, DNR/DNI,  with PMHx significant can't for A.fib on Eliquis, HTN who presented with AMS following a fall at home.  Found to have small Cerebellar ICH, Neurosurgery following and not recommending surgical intervention.  History of Present Illness:  Claire Kelley is a 89 year old female with a past medical history significant for atrial fibrillation on Eliquis, hypertension who presented to [facility] ED on 02/02/2024 due to altered mental status status post a fall earlier today.  She has been deconditioned and not quite her normal mental acuity since her hospitalization in January and discharge for AKI, went to rehab, and then back to her living facility last week.  However this morning appeared to be more tired than usual noted by family members who are with her.   Family members are with her all day and then around 6:30 PM they left briefly and found her on the floor next to her bed after they returned.  Unknown mechanism of fall.   Since there was no obvious injury and she continued to act and behave like she was all day, they continue to monitor.  Around midnight she appeared to be acutely more confused.  They then had her brought to the emergency department.  ED Course: Initial Vital Signs: Temperature 97.9 F orally, pulse 68, respiratory 18, blood pressure 192/71, SpO2 97% on room air Significant Labs: Sodium 133, potassium 5.2, glucose 112, BUN 38, creatinine 2.2, hemoglobin 9.8, hematocrit 30.3, platelets 138 Imaging CT Head & Cervical Spine>>IMPRESSION: 1. Small focus of intraparenchymal hemorrhage in the cerebellar vermis measuring 4 mm. No significant mass effect. 2. No acute fracture in the cervical spine Medications Administered:  Andexxa   Neurosurgery was consulted and did not recommend surgical intervention at this time.  PCCM asked to admit for further workup and treatment and for close neurochecks.  Please see significant hospital events section below for full detailed hospital course.   Pertinent  Medical History   Past Medical History:  Diagnosis Date   A-fib (HCC)    AICD (automatic cardioverter/defibrillator) present    Coronary artery disease    Diverticulosis    GERD (gastroesophageal reflux disease)    GERD (gastroesophageal reflux disease)    High cholesterol    Hypertension    Renal disorder    CKD     Micro Data:  N/A  Antimicrobials:   Anti-infectives (From admission, onward)    None       Significant Hospital Events: Including procedures, antibiotic start and stop dates in addition to other pertinent events   2/14: Presented to ED with worsening confusion following fall at home.  Found to have small Cerebellar ICH, Eliquis reversed in ED. Neurosurgery consulted, no surgical intervention indicated.  PCCM asked to admit.  Neurology consulted.  Patient with worsening confusion and agitation, repeat CT with slight enlargement of bleed but no mass effect or edema.  Requiring Precedex for agitation.  Palliative care consulted.  Interim History / Subjective:  As outlined above in significant hospital events section  Objective   Blood pressure (!) 153/79, pulse 74, temperature 97.6 F (36.4 C), temperature source Oral, resp. rate (!) 30, height 5\' 5"  (1.651 m), weight 50.1 kg, SpO2 100%.        Intake/Output Summary (Last 24  hours) at 02/02/2024 0736 Last data filed at 02/02/2024 0700 Gross per 24 hour  Intake 90 ml  Output --  Net 90 ml   Filed Weights   02/02/24 0226 02/02/24 0650  Weight: 50 kg 50.1 kg    Examination: General: Acute on chronically ill-appearing elderly female, laying in bed, on room air, in no acute distress HENT: Atraumatic, normocephalic, neck supple,  no JVD Lungs: Clear breath sounds throughout, even, nonlabored, normal effort Cardiovascular: Regular rate and rhythm, S1-S2, no murmurs, rubs, gallops Abdomen: Soft, nontender, nondistended, no guarding or rebound tenderness, bowel sounds positive x 4 Extremities: Normal bulk and tone, no deformities, no edema, no cyanosis Neuro: Sleeping, arouses easily to voice however becomes agitated, moving all extremities purposefully but not to command, no focal deficits GU: Deferred  Resolved Hospital Problem list     Assessment & Plan:   #Small Cerebellar ICH status post fall #Acute Metabolic Encephalopathy  #Delirium PMHx: A.fib on Eliquis -Neurosurgery and neurology following, appreciate input -ICU monitoring -Frequent Neurochecks q1-2 h per Neurology/Neurosurgery -Eliquis reversed in ED -Strict BP control ~ Maintain SBP <150 -Prn Labetaolol and Hydralazine as needed to maintain BP goal -DVT ppx with SCD (chemical ppx contraindicated) -Repeat CT Head with slight advancement of bleed but no edema or mass effect ~neurosurgery aware -Per Dr. Michaelle Copas note, patient's family would not want to pursue through surgical intervention if patient were to decline with worsening bleed -Will need MRI Brain -Will consult palliative care, appreciate input  #Acute Kidney Injury #Mild Hyponatremia #Mild Hyperkalemia -Monitor I&O's / urinary output -Follow BMP -Ensure adequate renal perfusion -Avoid nephrotoxic agents as able -Replace electrolytes as indicated ~ Pharmacy following for assistance with electrolyte replacement  #Anemia #Mild Thrombocytopenia -Monitor for S/Sx of bleeding -Trend CBC -SCD's for VTE Prophylaxis  -Transfuse for Hgb <7 -Transfuse platelets for platelet count of less than 10K, less than 50K with active bleeding, or less than 100 K for neurosurgical procedures    Best Practice (right click and "Reselect all SmartList Selections" daily)   Diet/type: NPO DVT  prophylaxis: SCD GI prophylaxis: PPI Lines: N/A Foley:  N/A Code Status:  DNR Last date of multidisciplinary goals of care discussion [2/14]  2/14: Patient's 2 daughters updated at bedside on plan of care.  Labs   CBC: Recent Labs  Lab 02/02/24 0238  WBC 8.9  HGB 9.8*  HCT 30.3*  MCV 105.2*  PLT 138*    Basic Metabolic Panel: Recent Labs  Lab 02/02/24 0238  NA 133*  K 5.2*  CL 97*  CO2 23  GLUCOSE 112*  BUN 38*  CREATININE 2.22*  CALCIUM 9.0   GFR: Estimated Creatinine Clearance: 12 mL/min (A) (by C-G formula based on SCr of 2.22 mg/dL (H)). Recent Labs  Lab 02/02/24 0238  WBC 8.9    Liver Function Tests: Recent Labs  Lab 02/02/24 0238  AST 17  ALT 14  ALKPHOS 104  BILITOT 1.1  PROT 6.9  ALBUMIN 3.8   No results for input(s): "LIPASE", "AMYLASE" in the last 168 hours. No results for input(s): "AMMONIA" in the last 168 hours.  ABG No results found for: "PHART", "PCO2ART", "PO2ART", "HCO3", "TCO2", "ACIDBASEDEF", "O2SAT"   Coagulation Profile: Recent Labs  Lab 02/02/24 0238  INR 1.2    Cardiac Enzymes: No results for input(s): "CKTOTAL", "CKMB", "CKMBINDEX", "TROPONINI" in the last 168 hours.  HbA1C: No results found for: "HGBA1C"  CBG: Recent Labs  Lab 02/02/24 0649  GLUCAP 100*    Review of Systems:  Unable to assess due to AMS  Past Medical History:  She,  has a past medical history of A-fib (HCC), AICD (automatic cardioverter/defibrillator) present, Coronary artery disease, Diverticulosis, GERD (gastroesophageal reflux disease), GERD (gastroesophageal reflux disease), High cholesterol, Hypertension, and Renal disorder.   Surgical History:   Past Surgical History:  Procedure Laterality Date   ABDOMINAL AORTIC ANEURYSM REPAIR     CORONARY ARTERY BYPASS GRAFT     PACEMAKER PLACEMENT       Social History:   reports that she has quit smoking. She has never used smokeless tobacco. She reports that she does not drink alcohol  and does not use drugs.   Family History:  Her family history includes Heart disease in her brother and sister.   Allergies Allergies  Allergen Reactions   Bactrim [Sulfamethoxazole-Trimethoprim] Other (See Comments)    Reaction:  Unknown    Ciprocinonide [Fluocinolone] Other (See Comments)    Reaction:  Unknown    Ciprofloxacin Other (See Comments)   Codeine    Lipitor [Atorvastatin] Other (See Comments)    Reaction:  Leg cramps    Macrobid [Nitrofurantoin Monohyd Macro] Other (See Comments)    Reaction:  Unknown    Paxil [Paroxetine Hcl] Other (See Comments)    Reaction:  Made pt "loopy"     Home Medications  Prior to Admission medications   Medication Sig Start Date End Date Taking? Authorizing Provider  acetaminophen (TYLENOL) 650 MG CR tablet Take 1,300 mg by mouth every 8 (eight) hours as needed for pain.    [provider]  amiodarone (PACERONE) 200 MG tablet Take 0.5 tablets by mouth daily. 04/20/20   [provider]  Biotin 1000 MCG CHEW Chew 500 mg by mouth daily.    [provider]  cholecalciferol (VITAMIN D) 25 MCG tablet Take 1 tablet (1,000 Units total) by mouth daily. 10/16/21   Drema Dallas, MD  Cholecalciferol (VITAMIN D3) 1000 UNITS CAPS Take 1 capsule by mouth daily.    [provider]  Cranberry 500 MG CAPS Take 1 capsule by mouth daily.    [provider]  CRANBERRY-VITAMIN C PO Take 1 capsule by mouth 2 (two) times daily.    [provider]  Cranberry-Vitamin C-Vitamin E 140-100-3 MG-MG-UNIT CAPS Take by mouth.    [provider]  donepezil (ARICEPT) 5 MG tablet Take by mouth. Patient not taking: Reported on 01/01/2024 08/16/18   [provider]  ELIQUIS 2.5 MG TABS tablet Take 2.5 mg by mouth 2 (two) times daily.    [provider]  hydrALAZINE (APRESOLINE) 25 MG tablet Take 25 mg by mouth 2 (two) times daily as needed. 11/20/23 11/19/24  [provider]  magnesium  gluconate (MAGONATE) 500 MG tablet Take 250 mg by mouth 2 (two) times daily.    [provider]  melatonin 1 MG TABS tablet Take 1 tablet by mouth at bedtime as needed.    [provider]  metoprolol succinate (TOPROL-XL) 25 MG 24 hr tablet Take 0.5 tablets (12.5 mg total) by mouth daily. 10/16/21   Drema Dallas, MD  oxybutynin (DITROPAN-XL) 5 MG 24 hr tablet Take 1 tablet by mouth at bedtime. 08/25/21   [provider]  pantoprazole (PROTONIX) 40 MG tablet Take 40 mg by mouth daily.    [provider]  rosuvastatin (CRESTOR) 5 MG tablet Take 5 mg by mouth daily. Patient not taking: Reported on 01/01/2024    [provider]  senna-docusate (SENOKOT-S) 8.6-50 MG tablet  Take 1 tablet by mouth as needed.    [provider]     Critical care time: 55 minutes     Harlon Ditty, AGACNP-BC [facility] Prefer epic messenger for cross cover needs If after hours, please call E-link

## 2024-02-02 NOTE — Consult Note (Addendum)
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 NCCT with small midline cerebellar IPH, likely hypertensive in etiolog, eliquis reversed with andexxa. Recommend stat neurosurgery consult, repeat CTH in 8-12 hrs, monitor in ICU overnight with q1hr neurochecks. Suggest MRI brain w/o along with MRA head w/o to assess for amyloid angiopathy but this is not urgent. Suggest evaluation for placement of a watchman once she is out of the acute phase as this would allow patient to get off of [facility] permanently.   Recommendations: Our recommendations are outlined below.  Diagnostic Studies : Repeat CT head in first 8-12hrs  Medications : Hold antiplatelet therapy/NSAIDS/Anticoagulation Warfarin/Coumadin/DOAC reversal per hospital protocol  Nursing recommendations : Telemetry, IV Fluids. Avoid dextrose containing fluids, Maintain euglycemia. Head of bed 30 degrees Neuro checks q1-2 hrs during ICU stay Once stable neuro checks q4 hrs BP control, lower SBP to less than 160 with goal of 140's  Consultations : Need Neurosurgery consultation STAT Recommend Speech therapy if failed dysphagia screen Physical therapy/Occupational therapy  DVT Prophylaxis : SCDs  ICH Score: 2 GlasGow Coma Score: 13-15 (0) Age >= 80: Yes (+1) ICH volume >= 30mL: No (0) Intraventricular hemorrhage: No (0) Infratentorial origin of hemorrhage: Yes (+1)   ----------------------------------------------------------------------------------------------------    Metrics: Dispatch Time: 02/02/2024 04:07:04 Callback Response Time: 02/02/2024 04:17:53  Primary Provider  Notified of Diagnostic Impression and Management Plan on: 02/02/2024 05:05:31     ----------------------------------------------------------------------------------------------------  Chief Complaint: confusion  History of Present Illness: Patient is [name]. 89yo woman with history of HTN, afib on eliquis, AAA, CHF, CKD4, CAD, DVT who presented with increased confusion. Patient had recent hospitalization in January she was recovering from with AKI, was in rehab. She was sent back to her ALF this week but was noted to be more tired than usual 2/13 during the daytime. Family left briefly and when they returned found her on the floor next to her bed at 630pm. Around midnight they noticed she was more confused prompting them to send her to the ED.   Past Medical History:      Hypertension      Atrial Fibrillation Other PMH:  large AAA  Medications:  Anticoagulant use:  Yes eliquis s/p andexxa No Antiplatelet use Reviewed EMR for current medications  Allergies:  Reviewed  Social History: Drug Use: No  Family History:  There is no family history of premature cerebrovascular disease pertinent to this consultation  ROS : ROS Cannot Be Obtained Because:  Patient Is Confused  Past Surgical History: There Is No Surgical History Contributory To Today's Visit   Examination: BP(150/90), Pulse(65), 1A: Level of Consciousness - Arouses to minor stimulation + 1 1B: Ask Month and Age - 1 Question Right + 1 1C: Blink Eyes & Squeeze Hands - Performs Both Tasks + 0 2: Test Horizontal Extraocular Movements - Normal + 0 3: Test Visual Fields - No Visual Loss + 0 4: Test Facial Palsy (Use Grimace if Obtunded) - Normal symmetry + 0 5A: Test Left Arm Motor Drift - No Drift for 10 Seconds + 0 5B: Test Right Arm Motor Drift - No Drift for 10 Seconds +  0 6A: Test Left Leg Motor Drift - No Drift for 5 Seconds + 0 6B: Test Right Leg Motor Drift - No Drift for 5 Seconds + 0 7:  Test Limb Ataxia (FNF/Heel-Shin) - No Ataxia + 0 8: Test Sensation - Normal; No sensory loss + 0 9: Test Language/Aphasia - Normal; No aphasia + 0 10: Test Dysarthria - Normal + 0 11: Test Extinction/Inattention - No abnormality + 0  NIHSS Score: 2 NIHSS Free Text : tremor in both hands, correct month, couldn't state year, a little drowsy, speech is normal  Spoke with : Dr. Modesto Charon    This consult was conducted in real time using interactive audio and Immunologist. Patient was informed of the technology being used for this visit and agreed to proceed. Patient located in hospital and provider located at home/office setting.  Patient is being evaluated for possible acute neurologic impairment and high probability of imminent or life - threatening deterioration.I spent total of 35 minutes providing care to this patient, including time for face to face visit via telemedicine, review of medical records, imaging studies and discussion of findings with providers, the patient and / or family.   Dr Idelle Jo   [facility] For Inpatient follow-up with [facility] physician please call [facility] at [phone]. As we are not an outpatient service for any post hospital discharge needs please contact the hospital for assistance.  If you have any questions for the [facility] physicians or need to reconsult for clinical or diagnostic changes please contact us via [facility] at [phone].

## 2024-02-02 NOTE — Progress Notes (Signed)
   02/02/24 2000  Spiritual Encounters  Type of Visit Initial  Care provided to: Family (Pt's Son in [place] was in ICU hallway)  Referral source Chaplain assessment  Reason for visit Routine spiritual support  OnCall Visit Yes  Spiritual Framework  Family Stress Factors Health changes;Other (Comment) (Discussion about grief; even when it's expected, it's still hurts)  Interventions  Spiritual Care Interventions Made Established relationship of care and support;Compassionate presence;Reflective listening;Explored values/beliefs/practices/strengths  Intervention Outcomes  Outcomes Connection to spiritual care;Awareness of support

## 2024-02-03 ENCOUNTER — Inpatient Hospital Stay (HOSPITAL_COMMUNITY)
Admit: 2024-02-03 | Discharge: 2024-02-03 | Disposition: A | Discharge status: Home / Self Care | Payer: Medicare Other | Attending: Internal Medicine | Admitting: Internal Medicine

## 2024-02-03 DIAGNOSIS — I614 Nontraumatic intracerebral hemorrhage in cerebellum: Secondary | ICD-10-CM

## 2024-02-03 DIAGNOSIS — I340 Nonrheumatic mitral (valve) insufficiency: Secondary | ICD-10-CM

## 2024-02-03 DIAGNOSIS — R4182 Altered mental status, unspecified: Secondary | ICD-10-CM | POA: Diagnosis not present

## 2024-02-03 DIAGNOSIS — Z515 Encounter for palliative care: Secondary | ICD-10-CM

## 2024-02-03 LAB — BASIC METABOLIC PANEL
Anion gap: 16 — ABNORMAL HIGH (ref 5–15)
BUN: 33 mg/dL — ABNORMAL HIGH (ref 8–23)
CO2: 22 mmol/L (ref 22–32)
Calcium: 9.1 mg/dL (ref 8.9–10.3)
Chloride: 99 mmol/L (ref 98–111)
Creatinine, Ser: 1.88 mg/dL — ABNORMAL HIGH (ref 0.44–1.00)
GFR, Estimated: 24 mL/min — ABNORMAL LOW (ref >60)
Glucose, Bld: 136 mg/dL — ABNORMAL HIGH (ref 70–99)
Potassium: 4.1 mmol/L (ref 3.5–5.1)
Sodium: 137 mmol/L (ref 135–145)

## 2024-02-03 LAB — TROPONIN I (HIGH SENSITIVITY): Troponin I (High Sensitivity): 267 ng/L (ref <18)

## 2024-02-03 LAB — MAGNESIUM: Magnesium: 1.9 mg/dL (ref 1.7–2.4)

## 2024-02-03 LAB — ECHOCARDIOGRAM COMPLETE
AR max vel: 1.31 cm2
AV Peak grad: 7.1 mm[Hg]
Ao pk vel: 1.34 m/s
Area-P 1/2: 4.31 cm2
Calc EF: 45.4 %
Height: 65 in
MV M vel: 5.62 m/s
MV Peak grad: 126.3 mm[Hg]
P 1/2 time: 757 ms
S' Lateral: 2.5 cm
Single Plane A2C EF: 46.8 %
Single Plane A4C EF: 45.4 %
Weight: 1767.21 [oz_av]

## 2024-02-03 LAB — RENAL FUNCTION PANEL
Albumin: 3 g/dL — ABNORMAL LOW (ref 3.5–5.0)
Anion gap: 10 (ref 5–15)
BUN: 33 mg/dL — ABNORMAL HIGH (ref 8–23)
CO2: 22 mmol/L (ref 22–32)
Calcium: 8.8 mg/dL — ABNORMAL LOW (ref 8.9–10.3)
Chloride: 104 mmol/L (ref 98–111)
Creatinine, Ser: 1.8 mg/dL — ABNORMAL HIGH (ref 0.44–1.00)
GFR, Estimated: 26 mL/min — ABNORMAL LOW (ref >60)
Glucose, Bld: 127 mg/dL — ABNORMAL HIGH (ref 70–99)
Phosphorus: 4 mg/dL (ref 2.5–4.6)
Potassium: 4.3 mmol/L (ref 3.5–5.1)
Sodium: 136 mmol/L (ref 135–145)

## 2024-02-03 LAB — URINALYSIS, ROUTINE W REFLEX MICROSCOPIC
Bacteria, UA: NONE SEEN
Bilirubin Urine: NEGATIVE
Glucose, UA: NEGATIVE mg/dL
Hgb urine dipstick: NEGATIVE
Ketones, ur: NEGATIVE mg/dL
Leukocytes,Ua: NEGATIVE
Nitrite: NEGATIVE
Protein, ur: 30 mg/dL — AB
Specific Gravity, Urine: 1.012 (ref 1.005–1.030)
pH: 6 (ref 5.0–8.0)

## 2024-02-03 LAB — CBC
HCT: 26.7 % — ABNORMAL LOW (ref 36.0–46.0)
Hemoglobin: 8.8 g/dL — ABNORMAL LOW (ref 12.0–15.0)
MCH: 33.8 pg (ref 26.0–34.0)
MCHC: 33 g/dL (ref 30.0–36.0)
MCV: 102.7 fL — ABNORMAL HIGH (ref 80.0–100.0)
Platelets: 91 10*3/uL — ABNORMAL LOW (ref 150–400)
RBC: 2.6 MIL/uL — ABNORMAL LOW (ref 3.87–5.11)
RDW: 13.2 % (ref 11.5–15.5)
WBC: 5.9 10*3/uL (ref 4.0–10.5)
nRBC: 0 % (ref 0.0–0.2)

## 2024-02-03 MED ORDER — HALOPERIDOL LACTATE 5 MG/ML IJ SOLN
2.0000 mg | Freq: Four times a day (QID) | INTRAMUSCULAR | Status: DC | PRN
  Administered 2024-02-03: 2 mg via INTRAVENOUS
  Filled 2024-02-03: qty 1

## 2024-02-03 MED ORDER — RISPERIDONE 1 MG PO TBDP
0.5000 mg | ORAL_TABLET | Freq: Two times a day (BID) | ORAL | Status: DC
  Administered 2024-02-03 – 2024-02-07 (×8): 0.5 mg via ORAL
  Filled 2024-02-03 (×13): qty 0.5

## 2024-02-03 MED ORDER — OXYCODONE-ACETAMINOPHEN 5-325 MG PO TABS
1.0000 | ORAL_TABLET | Freq: Four times a day (QID) | ORAL | Status: DC | PRN
  Administered 2024-02-06: 1 via ORAL
  Filled 2024-02-03: qty 1

## 2024-02-03 NOTE — Progress Notes (Addendum)
0800 After report from the night nurse that patient had been awake all night trying to get up, patient was found to be alert and oriented. No disorientation noted. Patient's speech was clear. She followed directions perfectly. Patient made eye contact when speaking with others. Precedex at 1.2 micrograms /kg/hour. Patient was still fully awake. Patient even knew the month and day of the week. Bath done. Echo cardiogram done as well.  1130 Daughters in to visit. Patient talked to daughters fluidly.  No confusion noted. 1230 Passed swallow evaluation for pureed food in small amounts for oral gratification. 1300 Patient getting tired. Sat up on side of the bed x 2 with PT and OT. 1500 Respiradol sublingual started and Precedex weaning also started. Patient not as interactive as earlier today. 1600 Patient sleeping more. Weaning Precedex. 1715 Troponin of 267 reported to D.Delton See NP. No treatment at this time. 1830 taking ice without issues

## 2024-02-03 NOTE — Consult Note (Signed)
Follow up note -  No change in hemorrhage on CT scan.  No intervention or follow up necessary.  Further care per primary team.

## 2024-02-03 NOTE — Evaluation (Addendum)
Physical Therapy Evaluation Patient Details Name: Claire Kelley MRN: [id number] DOB: April 10, 1926 Today's Date: 02/03/2024  History of Present Illness  Pt is a 89 y/o F admitted on 02/02/24 after presenting with c/o AMS following a fall at home. 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 PMH: a-fib on eliquis, HTN, AICD, CAD, HTN, high cholesterol, CKD  Clinical Impression  Pt seen for PT evaluation with co-tx with OT for pt & therapists' safety. Pt asleep but eventually awakened (cold wash cloth on forehead, light sternal rub, lights turned on). Pt's daughter [name]) present for session reporting prior to previous admission pt was ambulatory with rollator in her ALF but has been more limited to her room since returning there for rehab. On this date, pt demonstrates lower level cognitive impairments; also may be limited by [facility] (L ear better). Pt is able to move all extremities against gravity but unable to follow commands to come to sitting EOB. Pt with posterior lean/pushing back with sitting EOB attempts, unable to visually focus on therapist/daughter in room. Will continue to follow pt on trial basis. Pt left in bed with daughter in room, BUE mittens donned, bed alarm set; pt with nasal cannula O2 donned throughout session.      If plan is discharge home, recommend the following: Two people to help with bathing/dressing/bathroom;Two people to help with walking and/or transfers   Can travel by private vehicle   No    Equipment Recommendations None recommended by PT (defer to next venue)  Recommendations for Other Services       Functional Status Assessment       Precautions / Restrictions Precautions Precautions: Fall Precaution/Restrictions Comments: B hand mitts Restrictions Weight Bearing Restrictions Per Provider Order: No      Mobility  Bed Mobility Overal bed mobility: Needs Assistance Bed Mobility: Supine to Sit, Sit to  Supine     Supine to sit: Max assist, +2 for physical assistance, +2 for safety/equipment Sit to supine: Max assist, +2 for safety/equipment, +2 for physical assistance        Transfers                        Ambulation/Gait                  Stairs            Wheelchair Mobility     Tilt Bed    Modified Rankin (Stroke Patients Only)       Balance Overall balance assessment: Needs assistance Sitting-balance support: Feet unsupported, Bilateral upper extremity supported, No upper extremity supported Sitting balance-Leahy Scale: Zero Sitting balance - Comments: pt pushing back Postural control: Posterior lean                                   Pertinent Vitals/Pain Pain Assessment Pain Assessment: PAINAD Breathing: normal Negative Vocalization: none Facial Expression: smiling or inexpressive Body Language: tense, distressed pacing, fidgeting Consolability: distracted or reassured by voice/touch PAINAD Score: 2    Home Living Family/patient expects to be discharged to:: Assisted living                 Home Equipment: Rollator (4 wheels);Shower seat - built in;BSC/3in1 Additional Comments: [facility].    Prior Function Prior Level of Function : Independent/Modified Independent;History of Falls (last six months)  Mobility Comments: Prior to previous hospitalization pt was ambulatory with rollator (would go to/from dining room), following previous hospitalization & rehab pt did not ambulate to the dining hall because she didn't want to wear O2 there, but would intermittently use it in room. ADLs Comments: Prior to previous hospitalization pt was able to bathe & dress, toilet herself.     Extremity/Trunk Assessment   Upper Extremity Assessment Upper Extremity Assessment: Difficult to assess due to impaired cognition    Lower Extremity Assessment Lower Extremity Assessment: Difficult to assess due to  impaired cognition (pt able to raise BLE against gravity while supine in bed)    Cervical / Trunk Assessment Cervical / Trunk Assessment: Kyphotic  Communication   Communication Factors Affecting Communication: Hearing impaired (L ear hears better than R)    Cognition Arousal: Lethargic, Alert Behavior During Therapy: Restless, Anxious   PT - Cognitive impairments: Orientation, Awareness, Initiation, Attention, Memory, Sequencing, Problem solving, Safety/Judgement   Orientation impairments: Place, Time, Situation                     Following commands: Impaired Following commands impaired: Follows one step commands inconsistently, Follows one step commands with increased time     Cueing Cueing Techniques: Verbal cues, Gestural cues, Tactile cues, Visual cues     General Comments      Exercises     Assessment/Plan    PT Assessment Patient needs continued PT services  PT Problem List Decreased activity tolerance;Decreased mobility;Decreased cognition;Decreased safety awareness;Decreased knowledge of use of DME;Decreased balance;Decreased range of motion       PT Treatment Interventions DME instruction;Therapeutic activities;Balance training;Cognitive remediation;Wheelchair mobility training;Modalities;Manual techniques;Patient/family education;Neuromuscular re-education;Therapeutic exercise;Gait training;Functional mobility training    PT Goals (Current goals can be found in the Care Plan section)  Acute Rehab PT Goals Patient Stated Goal: see what pt is able to do, get better PT Goal Formulation: With family Time For Goal Achievement: 02/17/24 Potential to Achieve Goals: Poor    Frequency Min 1X/week     Co-evaluation PT/OT/SLP Co-Evaluation/Treatment: Yes Reason for Co-Treatment: Necessary to address cognition/behavior during functional activity;For patient/therapist safety;To address functional/ADL transfers PT goals addressed during session:  Mobility/safety with mobility;Balance OT goals addressed during session: ADL's and self-care       AM-PAC PT "6 Clicks" Mobility  Outcome Measure Help needed turning from your back to your side while in a flat bed without using bedrails?: A Lot Help needed moving from lying on your back to sitting on the side of a flat bed without using bedrails?: Total Help needed moving to and from a bed to a chair (including a wheelchair)?: Total Help needed standing up from a chair using your arms (e.g., wheelchair or bedside chair)?: Total Help needed to walk in hospital room?: Total Help needed climbing 3-5 steps with a railing? : Total 6 Click Score: 7    End of Session   Activity Tolerance:  (limited 2/2 impired cognition) Patient left: in bed;with call bell/phone within reach;with bed alarm set;with family/visitor present Nurse Communication: Mobility status PT Visit Diagnosis: Difficulty in walking, not elsewhere classified (R26.2);Other abnormalities of gait and mobility (R26.89)    Time: 1191-4782 PT Time Calculation (min) (ACUTE ONLY): 19 min   Charges:   PT Evaluation $PT Eval High Complexity: 1 High   PT General Charges $$ ACUTE PT VISIT: 1 Visit         Aleda Grana, PT, DPT 02/03/24, 2:34 PM   Sandi Mariscal 02/03/2024, 2:32  PM

## 2024-02-03 NOTE — Progress Notes (Signed)
  Echocardiogram 2D Echocardiogram has been performed.  Claire Kelley 02/03/2024, 9:57 AM

## 2024-02-03 NOTE — Evaluation (Signed)
Clinical/Bedside Swallow Evaluation Patient Details  Name: Claire Kelley MRN: [id number] Date of Birth: [birth year]  Today's Date: 02/03/2024 Time: SLP Start Time (ACUTE ONLY): 1130 SLP Stop Time (ACUTE ONLY): 1200 SLP Time Calculation (min) (ACUTE ONLY): 30 min  Past Medical History:  Past Medical History:  Diagnosis Date   A-fib (HCC)    AICD (automatic cardioverter/defibrillator) present    Coronary artery disease    Diverticulosis    GERD (gastroesophageal reflux disease)    GERD (gastroesophageal reflux disease)    High cholesterol    Hypertension    Renal disorder    CKD   Past Surgical History:  Past Surgical History:  Procedure Laterality Date   ABDOMINAL AORTIC ANEURYSM REPAIR     CORONARY ARTERY BYPASS GRAFT     PACEMAKER PLACEMENT     HPI:  89 y.o. female, DNR/DNI,  with PMHx significant can't for A.fib on Eliquis, HTN who presented with AMS following a fall at home.  Found to have small Cerebellar ICH. CT Head 02/02/24: 1. Slight enlargement of a small acute hemorrhage in the cerebellar  vermis, now 7 mm. No significant edema or mass effect. No new intracranial abnormality. Moderate chronic small vessel ischemic disease.    Assessment / Plan / Recommendation  Clinical Impression  Pt seen for bedside swallow assessment in the setting of Cerebellar ICH. Pt currently NPO, with SLP evaluation held on 2/14 secondary to lethargy with prior agitation. RN reporting continued agitation overnight. Pt initially sleeping soundly, with verbal/tactile/thermal cues provided to increase alertness. Pt opening eyes and nodding to indicate desire for water. Pillows and physical assist for repositioning patient to midline and aid safety for swallow. Pt on 4L O2 (baseline on 3L O2 since hospitalization in Jan 2025 per daughter). O2 saturation maintained at 98 and greater for duration of session. Pt seen with trials of thin liquid (via tsp and straw) and puree solids. No overt or subtle s/sx  pharyngeal dysphagia noted. No change to vocal quality across trials. Vitals stable for duration of trials.   Initially oral phase was limited (pt minimally moving ice in mouth, minimal attention to task of eating); however with repeated trials and change to straw sips, pt noted to have increased engagement in task and demonstrated ability to pull from straw and clear bolus from mouth. Pt demonstrated limited oral opening for stripping bolus from spoon, leading to 1/2 tsp trials of puree. Once in oral cavity, pt demonstrated ind transport and clearance for pureed solids. Given limited endurance and intermittent reduced attention to oral cavity, advanced solids not trialed.    Based on age, recent neuro injury, fluctuating agitation/lethargy, pt is at increased risk for aspiration. Recommend STRICT aspiration precautions (slow rate, small bites, elevated HOB, and alert for PO intake). Assistance for feeding and supervision for maintained alertness throughout PO intake. Medications whole vs crushed in puree. Recommend initiation of thin liquids and puree solids. Daughter, [name], reported understanding across education for results of assessment, diet recommendations, and aspiration precautions. Suspect that low endurance/appetite, attention to task of eating, and current cognition will impact amount of intake, recommend dietician follow up. MD and RN aware of recommendations. White board updated in room. Recommend palliative care consult.   Speech language evaluation held secondary to inconsistent arousal and emerging physical agitation with questioning. Pt with minimal verbalizations- with perseveration noted on "red string" (used in primary facility), verbal/visual redirection provided. Pt nodding intermittently to yes/no questions. Pt oriented to self- not situation or location. Further language eval  to follow.  SLP Visit Diagnosis: Dysphagia, oral phase (R13.11)    Aspiration Risk  Moderate aspiration  risk    Diet Recommendation   Thin;Dysphagia 1 (puree)  Medication Administration: Whole meds with puree (vs crushed)    Other  Recommendations Recommended Consults:  (dietician, palliative) Oral Care Recommendations: Oral care BID;Oral care before and after PO    Recommendations for follow up therapy are one component of a multi-disciplinary discharge planning process, led by the attending physician.  Recommendations may be updated based on patient status, additional functional criteria and insurance authorization.  Follow up Recommendations Follow physician's recommendations for discharge plan and follow up therapies      Assistance Recommended at Discharge    Functional Status Assessment Patient has had a recent decline in their functional status and demonstrates the ability to make significant improvements in function in a reasonable and predictable amount of time.  Frequency and Duration min 2x/week  2 weeks       Prognosis Prognosis for improved oropharyngeal function: Fair Barriers to Reach Goals: Cognitive deficits      Swallow Study   General Date of Onset: 02/03/24 HPI: 89 y.o. female, DNR/DNI,  with PMHx significant can't for A.fib on Eliquis, HTN who presented with AMS following a fall at home.  Found to have small Cerebellar ICH. CT Head 02/02/24: 1. Slight enlargement of a small acute hemorrhage in the cerebellar  vermis, now 7 mm. No significant edema or mass effect. No new intracranial abnormality. Moderate chronic small vessel ischemic disease. Type of Study: Bedside Swallow Evaluation Previous Swallow Assessment: none in chart Diet Prior to this Study: NPO Temperature Spikes Noted: No (Temp 98.4: WBC 5.9) Respiratory Status: Nasal cannula (4L) History of Recent Intubation: No Behavior/Cognition: Lethargic/Drowsy;Distractible;Requires cueing Oral Cavity Assessment: Within Functional Limits Oral Care Completed by SLP: Yes Oral Cavity - Dentition: Adequate natural  dentition Vision:  (did not attempt to self feed) Self-Feeding Abilities:  (did not attempt to self feed) Patient Positioning: Upright in bed Baseline Vocal Quality: Low vocal intensity Volitional Cough: Cognitively unable to elicit Volitional Swallow: Unable to elicit    Oral/Motor/Sensory Function Overall Oral Motor/Sensory Function: Within functional limits (based on informal assessment)   Ice Chips Ice chips: Impaired Presentation: Spoon Oral Phase Impairments: Poor awareness of bolus Oral Phase Functional Implications: Oral holding Pharyngeal Phase Impairments:  (none) Other Comments: extended time for increased awareness of bolus and completion of the swallow   Thin Liquid Thin Liquid: Impaired Presentation: Spoon;Straw Oral Phase Impairments: Poor awareness of bolus Oral Phase Functional Implications: Prolonged oral transit Pharyngeal  Phase Impairments:  (none) Other Comments: minimal oral manipulation/awarenss for spoon trials- increased awareness and speed of transport with straw sips    Nectar Thick Nectar Thick Liquid: Not tested   Honey Thick Honey Thick Liquid: Not tested   Puree Puree: Impaired Presentation: Spoon Oral Phase Impairments: Poor awareness of bolus (minimal oral acceptance) Oral Phase Functional Implications:  (none) Pharyngeal Phase Impairments:  (none)   Solid     Solid: Not tested     [place] Bayleigh Loflin Clapp, MS, CCC-SLP Speech Language Pathologist Rehab Services; [facility] [place] [phone] (ascom)   [place] J Clapp 02/03/2024,1:32 PM

## 2024-02-03 NOTE — Evaluation (Signed)
Occupational Therapy Evaluation Patient Details Name: Claire Kelley MRN: [id number] DOB: 27-Apr-1926 Today's Date: 02/03/2024   History of Present Illness   Pt is a 89 y/o F admitted on 02/02/24 after presenting with c/o AMS following a fall at home. 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 PMH: a-fib on eliquis, HTN, AICD, CAD, HTN, high cholesterol, CKD     Clinical Impressions Patient seen for OT evaluation, daughter Claire Kelley) present. Co-treatment completed for pt/therapist safety. Pt in deep sleep upon arrival, but eventually awakened (lights turned on, cold washcloth on forehead, sternal rub, initiating EOB mobility). Pt's daughter provided PLOF. Prior to recent admission in Jan 2024, pt was IND/MOD I for ADLs and using a rollator for functional mobility. Since returning from rehab, pt has required assistance for dressing, gets meals delivered to her room, and has had difficulty with FM skills/dexterity. Pt is HOH (L ear better). During evaluation, pt demonstrated poor insight, poor safety awareness, and decreased ability to follow single step commands. She was able to spontaneously move all extremities while supine in bed, however, unable to follow commands to complete supine to sit. Pt resisting movement/pushing back with attempts to sit at EOB. Pt was left in bed with all needs in reach and BUE mitts in place. Will continue to follow pt on trial basis.    If plan is discharge home, recommend the following:   Two people to help with walking and/or transfers;Two people to help with bathing/dressing/bathroom;Assistance with cooking/housework;Assist for transportation;Help with stairs or ramp for entrance;Assistance with feeding;Direct supervision/assist for financial management;Direct supervision/assist for medications management     Functional Status Assessment   Patient has had a recent decline in their functional status and demonstrates  the ability to make significant improvements in function in a reasonable and predictable amount of time.     Equipment Recommendations   Other (comment) (defer to next venue of care)     Recommendations for Other Services         Precautions/Restrictions   Precautions Precautions: Fall Precaution/Restrictions Comments: B hand mitts Restrictions Weight Bearing Restrictions Per Provider Order: No     Mobility Bed Mobility Overal bed mobility: Needs Assistance Bed Mobility: Supine to Sit, Sit to Supine     Supine to sit: Max assist, +2 for physical assistance, +2 for safety/equipment Sit to supine: Max assist, +2 for safety/equipment, +2 for physical assistance        Transfers       General transfer comment: unable      Balance Overall balance assessment: Needs assistance Sitting-balance support: Feet unsupported, Bilateral upper extremity supported, No upper extremity supported Sitting balance-Leahy Scale: Zero Sitting balance - Comments: Pt resisting movement/pushing back. Sat EOB briefly (<1 minute) then attempting to lay back down by bringing BLEs onto EOB. Postural control: Posterior lean         ADL either performed or assessed with clinical judgement   ADL Overall ADL's : Needs assistance/impaired     General ADL Comments: Total A for ADLs d/t impaired cognition.     Vision Baseline Vision/History: 1 Wears glasses Patient Visual Report: No change from baseline Additional Comments: Pt did not appear to focus with her eyes, unable to track movements/look at therapists despite multimodal cues. Able to make eye contact with daughter, however, unable to accurately identify who she is.     Perception         Praxis  Pertinent Vitals/Pain Pain Assessment Pain Assessment: PAINAD Breathing: normal Negative Vocalization: none Facial Expression: smiling or inexpressive Body Language: tense, distressed pacing, fidgeting Consolability:  distracted or reassured by voice/touch PAINAD Score: 2     Extremity/Trunk Assessment Upper Extremity Assessment Upper Extremity Assessment: Difficult to assess due to impaired cognition   Lower Extremity Assessment Lower Extremity Assessment: Difficult to assess due to impaired cognition (pt able to raise BLE against gravity while supine in bed)   Cervical / Trunk Assessment Cervical / Trunk Assessment: Kyphotic   Communication Communication Factors Affecting Communication: Hearing impaired (L ear hears better than R)   Cognition Arousal: Lethargic, Alert Behavior During Therapy: Restless, Anxious     OT - Cognition Comments: Per daughter, pt is normally alert & oriented. Disoriented x4 this date. Inconsistently followed single step commands. Poor insight and safety awareness.  Decreased alertness/arousal at beginning of session despite multimodal cues. Improved with turning lights on, placing cold washcloth on pt's forehead, and initiating EOB mobility. Unable to identify daughter in room.       Following commands: Impaired Following commands impaired: Follows one step commands inconsistently, Follows one step commands with increased time     Cueing  General Comments   Cueing Techniques: Verbal cues;Gestural cues;Tactile cues;Visual cues      Exercises Other Exercises Other Exercises: OT provided education re: role of OT, OT POC, post acute recs, sitting up for all meals, EOB/OOB mobility with assistance, home/fall safety.     Shoulder Instructions      Home Living Family/patient expects to be discharged to:: Assisted living       Home Equipment: Rollator (4 wheels);Shower seat - built in;BSC/3in1   Additional Comments: [facility].      Prior Functioning/Environment Prior Level of Function : Independent/Modified Independent;History of Falls (last Kelley months)       Mobility Comments: Prior to previous hospitalization pt was ambulatory with rollator (would  go to/from dining room), following previous hospitalization & rehab pt did not ambulate to the dining hall because she didn't want to wear O2 there, but would intermittently use it in room. ADLs Comments: PLOF obtained from daugther. At baseline, pt is IND/MOD I with ADLs (showers 2x/wk, sponge bathes other days) and goes to dining hall for meals using rollator. ALF staff assist with IADLs (meals, meds). Pt recently at rehab for 3 weeks after hospitalization in Jan 2024. Since being home at ALF, pt has required assistance for dressing and has been getting meals delivered to her room. Daughter reports recent difficulty with FM skills/dexterity requiring assistance for opening small container/packets.     OT Problem List: Decreased activity tolerance;Impaired balance (sitting and/or standing);Decreased cognition;Decreased safety awareness;Decreased knowledge of use of DME or AE;Decreased knowledge of precautions   OT Treatment/Interventions: Self-care/ADL training;Therapeutic exercise;Energy conservation;DME and/or AE instruction;Therapeutic activities;Cognitive remediation/compensation;Patient/family education;Balance training      OT Goals(Current goals can be found in the care plan section)   Acute Rehab OT Goals Patient Stated Goal: none stated OT Goal Formulation: With family Time For Goal Achievement: 02/17/24 Potential to Achieve Goals: Poor    OT Frequency:  Min 1X/week    Co-evaluation PT/OT/SLP Co-Evaluation/Treatment: Yes Reason for Co-Treatment: Necessary to address cognition/behavior during functional activity;For patient/therapist safety;To address functional/ADL transfers PT goals addressed during session: Mobility/safety with mobility;Balance OT goals addressed during session: ADL's and self-care      AM-PAC OT "6 Clicks" Daily Activity     Outcome Measure Help from another person eating meals?: Total Help from another  person taking care of personal grooming?:  Total Help from another person toileting, which includes using toliet, bedpan, or urinal?: Total Help from another person bathing (including washing, rinsing, drying)?: Total Help from another person to put on and taking off regular upper body clothing?: Total Help from another person to put on and taking off regular lower body clothing?: Total 6 Click Score: 6   End of Session Equipment Utilized During Treatment: Oxygen Nurse Communication: Mobility status  Activity Tolerance: Other (comment) (cognition/mental staus) Patient left: in bed;with call bell/phone within reach;with bed alarm set;with family/visitor present;Other (comment) (B hand mitts in place)  OT Visit Diagnosis: Other abnormalities of gait and mobility (R26.89);Other symptoms and signs involving cognitive function                Time: 3005-1102 OT Time Calculation (min): 19 min Charges:  OT General Charges $OT Visit: 1 Visit OT Evaluation $OT Eval High Complexity: 1 High  [facility] MS, OTR/L ascom [phone]  02/03/24, 2:40 PM

## 2024-02-03 NOTE — Progress Notes (Signed)
PHARMACY CONSULT NOTE - FOLLOW UP  Pharmacy Consult for Electrolyte Monitoring and Replacement   Recent Labs: Potassium  Date Value  02/03/2024 4.3 mmol/L  01/23/2014 4.4 mmol/L  01/21/2010 4.2 mEq/L   Magnesium (mg/dL)  Date Value  16/09/9603 1.9   Calcium (mg/dL)  Date Value  54/08/8118 8.8 (L)  01/21/2010 9.1   Calcium, Total (mg/dL)  Date Value  14/78/2956 8.7   Albumin (g/dL)  Date Value  21/30/8657 3.0 (L)  01/21/2010 3.5   Phosphorus (mg/dL)  Date Value  84/69/6295 4.0   Sodium  Date Value  02/03/2024 136 mmol/L  01/23/2014 135 mmol/L (L)  01/21/2010 146 mEq/L (H)    Assessment: 89 y.o. female with PMHx significant can't for A.fib on Eliquis, HTN who presented with AMS following a fall at home. Found to have small Cerebellar ICH. Pharmacy is asked to follow and replace electrolytes while in CCU  Goal of Therapy:  Electrolytes WNL  Plan:  No replacement needed F/u with AM labs.   Ronnald Ramp ,PharmD Clinical Pharmacist 02/03/2024 8:29 AM

## 2024-02-03 NOTE — Progress Notes (Signed)
NAME:  Claire Kelley, MRN:  [id number], DOB:  1926/04/20, LOS: 1 ADMISSION DATE:  02/02/2024, CONSULTATION DATE:  02/02/24 REFERRING MD:  Dr. Modesto Charon, CHIEF COMPLAINT:  Altered Mental Status following fall  Brief Pt Description / Synopsis:  89 y.o. female, DNR/DNI,  with PMHx significant can't for A.fib on Eliquis, HTN who presented with AMS following a fall at home.  Found to have small Cerebellar ICH, Neurosurgery following and not recommending surgical intervention.  History of Present Illness:  Claire Kelley is a 89 year old female with a past medical history significant for atrial fibrillation on Eliquis, hypertension who presented to [facility] ED on 02/02/2024 due to altered mental status status post a fall earlier today.  She has been deconditioned and not quite her normal mental acuity since her hospitalization in January and discharge for AKI, went to rehab, and then back to her living facility last week.  However this morning appeared to be more tired than usual noted by family members who are with her.   Family members are with her all day and then around 6:30 PM they left briefly and found her on the floor next to her bed after they returned.  Unknown mechanism of fall.   Since there was no obvious injury and she continued to act and behave like she was all day, they continue to monitor.  Around midnight she appeared to be acutely more confused.  They then had her brought to the emergency department.  ED Course: Initial Vital Signs: Temperature 97.9 F orally, pulse 68, respiratory 18, blood pressure 192/71, SpO2 97% on room air Significant Labs: Sodium 133, potassium 5.2, glucose 112, BUN 38, creatinine 2.2, hemoglobin 9.8, hematocrit 30.3, platelets 138 Imaging CT Head & Cervical Spine>>IMPRESSION: 1. Small focus of intraparenchymal hemorrhage in the cerebellar vermis measuring 4 mm. No significant mass effect. 2. No acute fracture in the cervical spine Medications Administered:  Andexxa   Neurosurgery was consulted and did not recommend surgical intervention at this time.  PCCM asked to admit for further workup and treatment and for close neurochecks.  Please see significant hospital events section below for full detailed hospital course.  Pertinent  Medical History   Past Medical History:  Diagnosis Date   A-fib (HCC)    AICD (automatic cardioverter/defibrillator) present    Coronary artery disease    Diverticulosis    GERD (gastroesophageal reflux disease)    GERD (gastroesophageal reflux disease)    High cholesterol    Hypertension    Renal disorder    CKD     Micro Data:  N/A  Antimicrobials:   Anti-infectives (From admission, onward)    None       Significant Hospital Events: Including procedures, antibiotic start and stop dates in addition to other pertinent events   2/14: Presented to ED with worsening confusion following fall at home.  Found to have small Cerebellar ICH, Eliquis reversed in ED. Neurosurgery consulted, no surgical intervention indicated.  PCCM asked to admit.  Neurology consulted.  Patient with worsening confusion and agitation, repeat CT with slight enlargement of bleed but no mass effect or edema.  Requiring Precedex for agitation.  Palliative care consulted 2/15: Palliative Care Team consulted and discussed goals of care following discussion pt transitioning to comfort/hospice disposition.  Weaning precedex gtt off and will transfer service to [facility] to pick up 02/16   Interim History / Subjective:  As outlined above in significant hospital events section  Objective   Blood pressure (!) 145/78, pulse 60, temperature  98.2 F (36.8 C), resp. rate 14, height 5\' 5"  (1.651 m), weight 50.1 kg, SpO2 100%.        Intake/Output Summary (Last 24 hours) at 02/03/2024 1530 Last data filed at 02/03/2024 1020 Gross per 24 hour  Intake 202.17 ml  Output 595 ml  Net -392.83 ml   Filed Weights   02/02/24 0226 02/02/24 0650   Weight: 50 kg 50.1 kg    Examination: General: Acute on chronically ill-appearing elderly female, laying in bed, on room air, in no acute distress HENT: Atraumatic, normocephalic, neck supple, no JVD Lungs: Clear throughout, even, non labored  Cardiovascular: Paced rhythm, no m/r/g, 2+ radial/1+ distal pulses, no edema Abdomen: +BS x4, soft, non tender, non distended  Extremities: Normal bulk and tone, no deformities Neuro: Intermittent agitation, unable to follow commands but moves all extremities GU: External catheter in place   Resolved Hospital Problem list   Hyperkalemia   Assessment & Plan:   #Small cerebellar ICH status post fall #Acute metabolic encephalopathy  #Delirium PMHx: A.fib on Eliquis - Neuro checks q4hrs  - Eliquis reversed in ED - Prn labetalol to maintain sbp <150 - DVT ppx with SCD (chemical ppx contraindicated) - Repeat CT Head with slight advancement of bleed but no edema or mass effect ~neurosurgery determined no interventions or neurosurgery follow-up  - Per Dr. Michaelle Copas note, patient's family would not want to pursue through surgical intervention if patient were to decline with worsening bleed - Neurosurgery and neurology signed off appreciate input  - Palliative care consulted appreciate input  - Utilize scheduled risperdal and prn haldol for agitation/delirium and wean precedex gtt off  - Prn oxycodone or low dose fentanyl for pain management   #Acute kidney injury~improving  #Mild Hyponatremia~improving - Trend BMP  - Strict I&O's - Ensure adequate renal perfusion - Avoid nephrotoxic agents as able - Replace electrolytes as indicated ~ Pharmacy following for assistance with electrolyte replacement  #Anemia #Mild Thrombocytopenia~worsening  - Trend CBC  - Monitor for s/sx of bleeding  - Transfuse for Hgb <7 - Transfuse for platelet count of less than 10K, less than 50K with active bleeding - SCD's for VTE Prophylaxis   Best Practice (right  click and "Reselect all SmartList Selections" daily)   Diet/type: DYS 1 DVT prophylaxis: SCD GI prophylaxis: PPI Lines: N/A Foley:  N/A Code Status:  DNR Last date of multidisciplinary goals of care discussion [02/03/24]  2/15: Patient's 2 daughters updated at bedside on plan of care.  Labs   CBC: Recent Labs  Lab 02/02/24 0238 02/03/24 0345  WBC 8.9 5.9  HGB 9.8* 8.8*  HCT 30.3* 26.7*  MCV 105.2* 102.7*  PLT 138* 91*    Basic Metabolic Panel: Recent Labs  Lab 02/02/24 0238 02/03/24 0007 02/03/24 0345 02/03/24 0346  NA 133* 137  --  136  K 5.2* 4.1  --  4.3  CL 97* 99  --  104  CO2 23 22  --  22  GLUCOSE 112* 136*  --  127*  BUN 38* 33*  --  33*  CREATININE 2.22* 1.88*  --  1.80*  CALCIUM 9.0 9.1  --  8.8*  MG  --   --  1.9  --   PHOS  --   --   --  4.0   GFR: Estimated Creatinine Clearance: 14.8 mL/min (A) (by C-G formula based on SCr of 1.8 mg/dL (H)). Recent Labs  Lab 02/02/24 0238 02/03/24 0345  WBC 8.9 5.9    Liver  Function Tests: Recent Labs  Lab 02/02/24 0238 02/03/24 0346  AST 17  --   ALT 14  --   ALKPHOS 104  --   BILITOT 1.1  --   PROT 6.9  --   ALBUMIN 3.8 3.0*   No results for input(s): "LIPASE", "AMYLASE" in the last 168 hours. No results for input(s): "AMMONIA" in the last 168 hours.  ABG No results found for: "PHART", "PCO2ART", "PO2ART", "HCO3", "TCO2", "ACIDBASEDEF", "O2SAT"   Coagulation Profile: Recent Labs  Lab 02/02/24 0238  INR 1.2    Cardiac Enzymes: No results for input(s): "CKTOTAL", "CKMB", "CKMBINDEX", "TROPONINI" in the last 168 hours.  HbA1C: No results found for: "HGBA1C"  CBG: Recent Labs  Lab 02/02/24 0649  GLUCAP 100*    Review of Systems:   Unable to assess due to AMS  Past Medical History:  She,  has a past medical history of A-fib (HCC), AICD (automatic cardioverter/defibrillator) present, Coronary artery disease, Diverticulosis, GERD (gastroesophageal reflux disease), GERD  (gastroesophageal reflux disease), High cholesterol, Hypertension, and Renal disorder.   Surgical History:   Past Surgical History:  Procedure Laterality Date   ABDOMINAL AORTIC ANEURYSM REPAIR     CORONARY ARTERY BYPASS GRAFT     PACEMAKER PLACEMENT       Social History:   reports that she has quit smoking. She has never used smokeless tobacco. She reports that she does not drink alcohol and does not use drugs.   Family History:  Her family history includes Heart disease in her brother and sister.   Allergies Allergies  Allergen Reactions   Bactrim [Sulfamethoxazole-Trimethoprim] Other (See Comments)    Reaction:  Unknown    Ciprocinonide [Fluocinolone] Other (See Comments)    Reaction:  Unknown    Ciprofloxacin Other (See Comments)   Codeine    Lipitor [Atorvastatin] Other (See Comments)    Reaction:  Leg cramps    Macrobid [Nitrofurantoin Monohyd Macro] Other (See Comments)    Reaction:  Unknown    Paxil [Paroxetine Hcl] Other (See Comments)    Reaction:  Made pt "loopy"     Home Medications  Prior to Admission medications   Medication Sig Start Date End Date Taking? Authorizing Provider  acetaminophen (TYLENOL) 650 MG CR tablet Take 1,300 mg by mouth every 8 (eight) hours as needed for pain.    [provider]  amiodarone (PACERONE) 200 MG tablet Take 0.5 tablets by mouth daily. 04/20/20   [provider]  Biotin 1000 MCG CHEW Chew 500 mg by mouth daily.    [provider]  cholecalciferol (VITAMIN D) 25 MCG tablet Take 1 tablet (1,000 Units total) by mouth daily. 10/16/21   Drema Dallas, MD  Cholecalciferol (VITAMIN D3) 1000 UNITS CAPS Take 1 capsule by mouth daily.    [provider]  Cranberry 500 MG CAPS Take 1 capsule by mouth daily.    [provider]  CRANBERRY-VITAMIN C PO Take 1 capsule by mouth 2 (two) times daily.    [provider]  Cranberry-Vitamin C-Vitamin E 140-100-3 MG-MG-UNIT CAPS Take by mouth.     [provider]  donepezil (ARICEPT) 5 MG tablet Take by mouth. Patient not taking: Reported on 01/01/2024 08/16/18   [provider]  ELIQUIS 2.5 MG TABS tablet Take 2.5 mg by mouth 2 (two) times daily.    [provider]  hydrALAZINE (APRESOLINE) 25 MG tablet Take 25 mg by mouth 2 (two) times daily as needed. 11/20/23 11/19/24  [provider]  magnesium gluconate (MAGONATE) 500 MG tablet Take 250 mg by mouth 2 (two) times daily.    [provider]  melatonin 1 MG TABS tablet Take 1 tablet by mouth at bedtime as needed.    [provider]  metoprolol succinate (TOPROL-XL) 25 MG 24 hr tablet Take 0.5 tablets (12.5 mg total) by mouth daily. 10/16/21   Drema Dallas, MD  oxybutynin (DITROPAN-XL) 5 MG 24 hr tablet Take 1 tablet by mouth at bedtime. 08/25/21   [provider]  pantoprazole (PROTONIX) 40 MG tablet Take 40 mg by mouth daily.    [provider]  rosuvastatin (CRESTOR) 5 MG tablet Take 5 mg by mouth daily. Patient not taking: Reported on 01/01/2024    [provider]  senna-docusate (SENOKOT-S) 8.6-50 MG tablet Take 1 tablet by mouth as needed.    [provider]     Critical care time: 40 minutes    Zada Girt, AGNP  Pulmonary/Critical Care Pager [phone] (please enter 7 digits) PCCM Consult Pager [phone] (please enter 7 digits)

## 2024-02-03 NOTE — Consult Note (Signed)
 Consultation Note Date: 02/03/2024   Patient Name: Kalisha Keadle  DOB: 1925-12-23  MRN: [id number]  Age / Sex: 89 y.o., female  PCP: Annia Belt, [place] Bailiff, MD Referring Physician: Janann Colonel, MD  Reason for Consultation: Establishing goals of care   HPI/Brief Hospital Course: 89 y.o. female  with past medical history of A. Fib on Eliquis and HTN admitted from [facility] ALF on 02/02/2024 with AMS s/p fall.  Noted recent hospitalization 1/12-1/17 for AKI complicated by delirium-family shares Ms. Doughten never fully recovered back to her baseline post hospitalization  ED imaging revealed small cerebellar ICH-neurosurgery consulted, not recommending surgical intervention, repeat imaging-slight enlargement but remains stable, no further monitoring recommended   Palliative medicine was consulted for assisting with goals of care conversations.  Subjective:  Extensive chart review has been completed prior to meeting patient including labs, vital signs, imaging, progress notes, orders, and available advanced directive documents from current and previous encounters.  Visited with Ms. Barnard at her bedside. She is resting in bed with eyes closed, she is able to open her eyes to calling of her name, attempts to communicate and follow commands-requires frequent redirection, easily drifts back to sleep without engagement. Daughter at bedside requests family meeting outside of ICU.  Met with 3 daughters (all HCPOA) and son in law.  Introduced myself as a Publishing rights manager as a member of the palliative care team. Explained palliative medicine is specialized medical care for people living with serious illness. It focuses on providing relief from the symptoms and stress of a serious illness. The goal is to improve quality of life for both the patient and the family.   Family able to share their understanding of current medical condition.  Family speaks to Ms.  Axley's decline since last hospitalization. They also speak to Ms. Veitch's poor PO intake and lack of appetite for several months.  We discussed patient's current illness and what it means in the larger context of patient's on-going co-morbidities. Natural disease trajectory and expectations at EOL were discussed.   The difference between aggressive medical intervention and comfort care was discussed.  Family expresses interest in possibly pursuing hospice services at discharge. We discussed overall philosophy of hospice, services provided and difference between hospice at home verus LTC versus IPU.  Discussed most appropriate disposition to be determined once Ms. Derenzo weaned from Precedex infusion.  In light of family expressing interest in hospice, we also discussed transitioning to comfort care while she remains inpatient. Discussed Ms. Hartog would no longer receive aggressive medical interventions such as continuous vital signs, lab work, radiology testing, or medications not focused on comfort. All care would focus on how the patient is looking and feeling. This would include management of any symptoms that may cause discomfort, pain, shortness of breath, cough, nausea, agitation, anxiety, and/or secretions etc. Symptoms would be managed with medications and other non-pharmacological interventions such as spiritual support if requested, repositioning, music therapy, or therapeutic listening. Family verbalized understanding and appreciation.    Family interested in continuing with current plan of care today with attempting to wean from Precedex infusion with addition/adjustments to medications to manage anxiety/agitation. Family wishes to meet again tomorrow to readdress goals at that time based on response today.  I discussed importance of continued conversations with family/support persons and all members of their medical team regarding overall plan of care and treatment options  ensuring decisions are in alignment with patients goals of care.  All questions/concerns addressed. Emotional support provided to patient/family/support persons. PMT will  continue to follow and support patient as needed.  Objective: Primary Diagnoses: Present on Admission:  ICH (intracerebral hemorrhage) (HCC)   Physical Exam Constitutional:      General: She is not in acute distress.    Appearance: She is ill-appearing.  Pulmonary:     Effort: Pulmonary effort is normal. No respiratory distress.  Skin:    General: Skin is warm and dry.  Neurological:     Motor: Weakness present.     Vital Signs: BP 117/75   Pulse 70   Temp 98.2 F (36.8 C)   Resp 17   Ht 5\' 5"  (1.651 m)   Wt 50.1 kg   SpO2 100%   BMI 18.38 kg/m  Pain Scale: PAINAD   Pain Score: Asleep  IO: Intake/output summary:  Intake/Output Summary (Last 24 hours) at 02/03/2024 1501 Last data filed at 02/03/2024 1020 Gross per 24 hour  Intake 202.17 ml  Output 595 ml  Net -392.83 ml    LBM: Last BM Date : 02/01/24 Baseline Weight: Weight: 50 kg Most recent weight: Weight: 50.1 kg      Assessment and Plan  SUMMARY OF RECOMMENDATIONS   Time for outcomes Wean off Precedex-CCM managing medications for anxiety/agitation Discuss transition to comfort/hospice disposition again tomorrow  Palliative Prophylaxis:   Bowel Regimen, Delirium Protocol and Frequent Pain Assessment  Discussed With: Nursing staff and CCM team   Thank you for this consult and allowing Palliative Medicine to participate in the care of [facility]. Palliative medicine will continue to follow and assist as needed.   Time Total: 75 minutes  Time spent includes: Detailed review of medical records (labs, imaging, vital signs), medically appropriate exam (mental status, respiratory, cardiac, skin), discussed with treatment team, counseling and educating patient, family and staff, documenting clinical information, medication management  and coordination of care.   Signed by: Leeanne Deed, DNP, AGNP-C Palliative Medicine    Please contact Palliative Medicine Team phone at [phone] for questions and concerns.  For individual provider: See Loretha Stapler

## 2024-02-03 NOTE — Plan of Care (Signed)
Neurology plan of care  Primary team has no further questions for neurology and neurology has no further inpatient recommendations. I will arrange outpatient neuro f/u. Neurology to s/o, please re-engage if additional neurologic concerns arise.  Bing Neighbors, MD [facility] [phone]  If 7pm- 7am, please page neurology on call as listed in AMION.

## 2024-02-04 DIAGNOSIS — Z515 Encounter for palliative care: Secondary | ICD-10-CM | POA: Diagnosis not present

## 2024-02-04 DIAGNOSIS — R4182 Altered mental status, unspecified: Secondary | ICD-10-CM | POA: Diagnosis not present

## 2024-02-04 DIAGNOSIS — I614 Nontraumatic intracerebral hemorrhage in cerebellum: Secondary | ICD-10-CM | POA: Diagnosis not present

## 2024-02-04 LAB — RENAL FUNCTION PANEL
Albumin: 3.3 g/dL — ABNORMAL LOW (ref 3.5–5.0)
Anion gap: 11 (ref 5–15)
BUN: 35 mg/dL — ABNORMAL HIGH (ref 8–23)
CO2: 23 mmol/L (ref 22–32)
Calcium: 9 mg/dL (ref 8.9–10.3)
Chloride: 105 mmol/L (ref 98–111)
Creatinine, Ser: 2.04 mg/dL — ABNORMAL HIGH (ref 0.44–1.00)
GFR, Estimated: 22 mL/min — ABNORMAL LOW (ref >60)
Glucose, Bld: 88 mg/dL (ref 70–99)
Phosphorus: 3.4 mg/dL (ref 2.5–4.6)
Potassium: 4.5 mmol/L (ref 3.5–5.1)
Sodium: 139 mmol/L (ref 135–145)

## 2024-02-04 LAB — CBC
HCT: 29.1 % — ABNORMAL LOW (ref 36.0–46.0)
Hemoglobin: 9.8 g/dL — ABNORMAL LOW (ref 12.0–15.0)
MCH: 33.4 pg (ref 26.0–34.0)
MCHC: 33.7 g/dL (ref 30.0–36.0)
MCV: 99.3 fL (ref 80.0–100.0)
Platelets: 101 10*3/uL — ABNORMAL LOW (ref 150–400)
RBC: 2.93 MIL/uL — ABNORMAL LOW (ref 3.87–5.11)
RDW: 13.2 % (ref 11.5–15.5)
WBC: 11.6 10*3/uL — ABNORMAL HIGH (ref 4.0–10.5)
nRBC: 0 % (ref 0.0–0.2)

## 2024-02-04 LAB — GLUCOSE, CAPILLARY: Glucose-Capillary: 93 mg/dL (ref 70–99)

## 2024-02-04 MED ORDER — ALPRAZOLAM 0.5 MG PO TABS
0.5000 mg | ORAL_TABLET | Freq: Once | ORAL | Status: AC
  Administered 2024-02-04: 0.5 mg via ORAL
  Filled 2024-02-04: qty 1

## 2024-02-04 NOTE — Progress Notes (Signed)
PHARMACY CONSULT NOTE - FOLLOW UP  Pharmacy Consult for Electrolyte Monitoring and Replacement   Recent Labs: Potassium  Date Value  02/04/2024 4.5 mmol/L  01/23/2014 4.4 mmol/L  01/21/2010 4.2 mEq/L   Magnesium (mg/dL)  Date Value  29/56/2130 1.9   Calcium (mg/dL)  Date Value  86/57/8469 9.0  01/21/2010 9.1   Calcium, Total (mg/dL)  Date Value  62/95/2841 8.7   Albumin (g/dL)  Date Value  32/44/0102 3.3 (L)  01/21/2010 3.5   Phosphorus (mg/dL)  Date Value  72/53/6644 3.4   Sodium  Date Value  02/04/2024 139 mmol/L  01/23/2014 135 mmol/L (L)  01/21/2010 146 mEq/L (H)    Assessment: 89 y.o. female with PMHx significant can't for A.fib on Eliquis, HTN who presented with AMS following a fall at home. Found to have small Cerebellar ICH. Pharmacy is asked to follow and replace electrolytes while in CCU. Scr trending up again. Baseline Scr 1.9.   Goal of Therapy:  Electrolytes WNL  Plan:  No replacement needed F/u with AM labs.   Ronnald Ramp ,PharmD Clinical Pharmacist 02/04/2024 7:58 AM

## 2024-02-04 NOTE — TOC Initial Note (Signed)
Transition of Care [facility]) - Initial/Assessment Note    Patient Details  Name: Claire Kelley MRN: [id number] Date of Birth: 21-Jun-1926  Transition of Care [facility]) CM/SW Contact:    Liliana Cline, LCSW Phone Number: 02/04/2024, 5:27 PM  Clinical Narrative:                 CSW was notified by Palliative NP that family has elected for LTC with hospice. Patient is now total care/bed bound. Family would like to know if patient can return to [facility] but move to their Memory Care side. CSW left a VM for Lisa at [place] to inquire about this - left weekday TOC follow up number as well. CSW also called and spoke with daughter [name]. [name] confirms plan for LTC with Hospice. Explained agency optoins and provided Medicare Care Compare website for [name] to review hospice agency options as well as SNF options if [facility] cannot take her back. [name] states if patient cannot go back to [place], her top choice would be [facility]TC if they have availability.  Expected Discharge Plan: Skilled Nursing Facility Barriers to Discharge: Continued Medical Work up   Patient Goals and CMS Choice Patient states their goals for this hospitalization and ongoing recovery are:: LTC with hospice CMS Medicare.gov Compare Post Acute Care list provided to:: Patient Represenative (must comment) Choice offered to / list presented to : Adult Children      Expected Discharge Plan and Services       Living arrangements for the past 2 months: Assisted Living Facility                                      Prior Living Arrangements/Services Living arrangements for the past 2 months: Assisted Living Facility Lives with:: Facility Resident Patient language and need for interpreter reviewed:: Yes Do you feel safe going back to the place where you live?: Yes      Need for Family Participation in Patient Care: Yes (Comment) Care giver support system in place?: Yes (comment) Current home services:  DME Criminal Activity/Legal Involvement Pertinent to Current Situation/Hospitalization: No - Comment as needed  Activities of Daily Living   ADL Screening (condition at time of admission) Independently performs ADLs?: Yes (appropriate for developmental age) Is the patient deaf or have difficulty hearing?: No Does the patient have difficulty seeing, even when wearing glasses/contacts?: No Does the patient have difficulty concentrating, remembering, or making decisions?: No  Permission Sought/Granted Permission sought to share information with : Facility Industrial/product designer granted to share information with : Yes, Verbal Permission Granted (by daughter [name])     Permission granted to share info w AGENCY: ALF, SNF, Hospice agencies        Emotional Assessment         Alcohol / Substance Use: Not Applicable Psych Involvement: No (comment)  Admission diagnosis:  Cerebellar bleed (HCC) [I61.4] ICH (intracerebral hemorrhage) (HCC) [I61.9] Altered mental status, unspecified altered mental status type [R41.82] Patient Active Problem List   Diagnosis Date Noted   ICH (intracerebral hemorrhage) (HCC) 02/02/2024   Cerebellar bleed (HCC) 02/02/2024   Constipation 01/01/2024   Acute metabolic encephalopathy 01/01/2024   DVT (deep venous thrombosis) (HCC) 01/01/2024   Hyperkalemia 12/31/2023   Chronic diastolic CHF (congestive heart failure) (HCC) 12/31/2023   Acute renal failure superimposed on stage 3b chronic kidney disease (HCC) 10/15/2021   Multifocal pneumonia 10/10/2021   Pleural  effusion on left 10/10/2021   Hypertension    GERD (gastroesophageal reflux disease)    Coronary artery disease    A-fib (HCC)    Atrial fibrillation, chronic (HCC)    Abdominal pain    Hiatal hernia    AAA (abdominal aortic aneurysm) (HCC)    Left rib fracture    Sepsis (HCC)    CKD (chronic kidney disease), stage IV (HCC)    Acute cystitis 05/29/2015   Altered mental status  05/27/2015   PCP:  Eldridge Abrahams, MD Pharmacy:   [facility] [place], [place] - [address] [place] [address] Phone: [phone] Fax: [phone]  [facility] [place], [place] [address] [place] [address] Phone: [phone] Fax: [phone]     Social Drivers of Health (SDOH) Social History: SDOH Screenings   Food Insecurity: No Food Insecurity (02/02/2024)  Housing: Low Risk  (02/02/2024)  Transportation Needs: No Transportation Needs (02/02/2024)  Utilities: Not At Risk (02/02/2024)  Financial Resource Strain: Low Risk  (04/29/2023)   Received from [facility]  Social Connections: Moderately Isolated (02/02/2024)  Tobacco Use: Medium Risk (02/02/2024)   SDOH Interventions:     Readmission Risk Interventions     No data to display

## 2024-02-04 NOTE — Progress Notes (Signed)
Daily Progress Note   Patient Name: Claire Kelley       Date: 02/04/2024 DOB: 1926-10-03  Age: 89 y.o. MRN#: [id number] Attending Physician: Loyce Dys, MD Primary Care Physician: Eldridge Abrahams, MD Admit Date: 02/02/2024  Reason for Consultation/Follow-up: Establishing goals of care  HPI/Brief Hospital Review: 89 y.o. female  with past medical history of A. Fib on Eliquis and HTN admitted from [facility] ALF on 02/02/2024 with AMS s/p fall.   Noted recent hospitalization 1/12-1/17 for AKI complicated by delirium-family shares Claire Kelley never fully recovered back to her baseline post hospitalization   ED imaging revealed small cerebellar ICH-neurosurgery consulted, not recommending surgical intervention, repeat imaging-slight enlargement but remains stable, no further monitoring recommended   Palliative medicine was consulted for assisting with goals of care conversations.  Subjective: Extensive chart review has been completed prior to meeting patient including labs, vital signs, imaging, progress notes, orders, and available advanced directive documents from current and previous encounters.    Visited with Claire Kelley at her bedside. Resting in bed with eyes closed, responds to calling of her name, able to intermittently follow simple commands. Daughters at bedside during time of visit.  Tolerating being off Precedex, seems to be responding well to Risperidone. Family confirms they wish to pursue hospice at discharge, do not feel Claire Kelley is IPU appropriate at this time as symptoms well controlled on current regimen.  TOC engaged to assist with hospice coordination and disposition. Unlikely Claire Kelley will be able to return to ALF, may be appropriate for memory care unit  based on ability to provide total care versus need for LTC.  Answered and addressed all questions and concerns. PMT to step away from daily visits as plan and goals are set. PMT will remain available if needs or concerns arise.  Care plan was discussed with TOC, primary team and nursing staff.  Thank you for allowing the Palliative Medicine Team to assist in the care of this patient.  Total time:  35 minutes  Time spent includes: Detailed review of medical records (labs, imaging, vital signs), medically appropriate exam (mental status, respiratory, cardiac, skin), discussed with treatment team, counseling and educating patient, family and staff, documenting clinical information, medication management and coordination of care.  Claire Deed, DNP, AGNP-C Palliative Medicine   Please contact Palliative Medicine  Team phone at [phone] for questions and concerns.

## 2024-02-04 NOTE — Plan of Care (Signed)
  Problem: Education: Goal: Knowledge of disease or condition will improve Outcome: Progressing Goal: Knowledge of secondary prevention will improve (MUST DOCUMENT ALL) Outcome: Progressing Goal: Knowledge of patient specific risk factors will improve (DELETE if not current risk factor) Outcome: Progressing   Problem: Intracerebral Hemorrhage Tissue Perfusion: Goal: Complications of Intracerebral Hemorrhage will be minimized Outcome: Progressing   Problem: Coping: Goal: Will verbalize positive feelings about self Outcome: Progressing Goal: Will identify appropriate support needs Outcome: Progressing   Problem: Health Behavior/Discharge Planning: Goal: Ability to manage health-related needs will improve Outcome: Progressing Goal: Goals will be collaboratively established with patient/family Outcome: Progressing   Problem: Self-Care: Goal: Ability to participate in self-care as condition permits will improve Outcome: Progressing Goal: Verbalization of feelings and concerns over difficulty with self-care will improve Outcome: Progressing Goal: Ability to communicate needs accurately will improve Outcome: Progressing   Problem: Nutrition: Goal: Risk of aspiration will decrease Outcome: Progressing Goal: Dietary intake will improve Outcome: Progressing   Problem: Education: Goal: Knowledge of General Education information will improve Description: Including pain rating scale, medication(s)/side effects and non-pharmacologic comfort measures Outcome: Progressing   Problem: Clinical Measurements: Goal: Ability to maintain clinical measurements within normal limits will improve Outcome: Progressing Goal: Will remain free from infection Outcome: Progressing Goal: Diagnostic test results will improve Outcome: Progressing Goal: Respiratory complications will improve Outcome: Progressing Goal: Cardiovascular complication will be avoided Outcome: Progressing   Problem:  Health Behavior/Discharge Planning: Goal: Ability to manage health-related needs will improve Outcome: Progressing   Problem: Activity: Goal: Risk for activity intolerance will decrease Outcome: Progressing   Problem: Nutrition: Goal: Adequate nutrition will be maintained Outcome: Progressing   Problem: Coping: Goal: Level of anxiety will decrease Outcome: Progressing   Problem: Elimination: Goal: Will not experience complications related to bowel motility Outcome: Progressing Goal: Will not experience complications related to urinary retention Outcome: Progressing   Problem: Elimination: Goal: Will not experience complications related to bowel motility Outcome: Progressing Goal: Will not experience complications related to urinary retention Outcome: Progressing   Problem: Pain Managment: Goal: General experience of comfort will improve and/or be controlled Outcome: Progressing   Problem: Safety: Goal: Ability to remain free from injury will improve Outcome: Progressing   Problem: Skin Integrity: Goal: Risk for impaired skin integrity will decrease Outcome: Progressing

## 2024-02-04 NOTE — Progress Notes (Signed)
Progress Note   Patient: Claire Kelley JWJ:[id number] DOB: 03-03-1926 DOA: 02/02/2024     2 DOS: the patient was seen and examined on 02/04/2024   Brief hospital course: 89 y.o. female with PMHx significant can't for A.fib on Eliquis, HTN who presented with AMS following a fall at home. Found to have small Cerebellar ICH, Neurosurgery following and not recommending surgical intervention. Palliative consulted following goals of care conversation with pts daughter pt will discharge to hospice at her previous facility vs. hospice home.  2/14: Presented to ED with worsening confusion following fall at home.  Found to have small Cerebellar ICH, Eliquis reversed in ED. Neurosurgery consulted, no surgical intervention indicated.  PCCM asked to admit.  Neurology consulted.  Patient with worsening confusion and agitation, repeat CT with slight enlargement of bleed but no mass effect or edema.  Requiring Precedex for agitation.  Palliative care consulted 2/15: Palliative Care Team consulted and discussed goals of care following discussion pt transitioning to comfort/hospice disposition.  Weaning precedex gtt off and will transfer service to [facility] to pick up 02/16     Assessment and Plan:   #Small cerebellar ICH status post fall #Acute metabolic encephalopathy-improved #Delirium PMHx: A.fib on Eliquis -Continue neurochecks  Eliquis discontinued Maintain adequate blood pressure control Patient was seen by neurosurgery and no surgical intervention recommended    #Acute kidney injury~improving  #Mild Hyponatremia~improving Continue to monitor renal function and electrolyte   #Anemia #Mild Thrombocytopenia~worsening  Monitor CBC closely   Diet/type: DYS level 1 diet  DVT prophylaxis: SCD  GI prophylaxis: PPI   Subjective:  Patient seen and examined at bedside this morning Denies nausea vomiting abdominal pain Plan of care discussed with patient's daughter at bedside who agrees to hospice  at discharge  Physical Exam: General: Acute on chronically ill-appearing elderly female, laying in bed, on room air, in no acute distress HENT: Atraumatic, normocephalic, neck supple, no JVD Lungs: Clear throughout, even, non labored  Cardiovascular: Paced rhythm, no m/r/g, 2+ radial/1+ distal pulses, no edema Abdomen: +BS x4, soft, non tender, non distended  Extremities: Normal bulk and tone, no deformities Neuro: Intermittent agitation, unable to follow commands but moves all extremities GU: External catheter in place   Vitals:   02/04/24 1200 02/04/24 1300 02/04/24 1400 02/04/24 1500  BP: (!) 109/50 (!) 130/50 137/72 104/81  Pulse: 75 87 73 80  Resp: (!) 22 (!) 21 19 (!) 24  Temp: 98.2 F (36.8 C)     TempSrc: Axillary     SpO2: 100% 100% 96% 97%  Weight:      Height:        Data Reviewed:    Latest Ref Rng & Units 02/04/2024    5:34 AM 02/03/2024    3:46 AM 02/03/2024   12:07 AM  BMP  Glucose 70 - 99 mg/dL 88  621  308   BUN 8 - 23 mg/dL 35  33  33   Creatinine 0.44 - 1.00 mg/dL 6.57  8.46  9.62   Sodium 135 - 145 mmol/L 139  136  137   Potassium 3.5 - 5.1 mmol/L 4.5  4.3  4.1   Chloride 98 - 111 mmol/L 105  104  99   CO2 22 - 32 mmol/L 23  22  22    Calcium 8.9 - 10.3 mg/dL 9.0  8.8  9.1        Latest Ref Rng & Units 02/04/2024    5:34 AM 02/03/2024    3:45 AM 02/02/2024  2:38 AM  CBC  WBC 4.0 - 10.5 K/uL 11.6  5.9  8.9   Hemoglobin 12.0 - 15.0 g/dL 9.8  8.8  9.8   Hematocrit 36.0 - 46.0 % 29.1  26.7  30.3   Platelets 150 - 400 K/uL 101  91  138      Author: Loyce Dys, MD 02/04/2024 3:12 PM  For on call review [email or web address].

## 2024-02-05 DIAGNOSIS — R4182 Altered mental status, unspecified: Secondary | ICD-10-CM | POA: Diagnosis not present

## 2024-02-05 DIAGNOSIS — I614 Nontraumatic intracerebral hemorrhage in cerebellum: Secondary | ICD-10-CM | POA: Diagnosis not present

## 2024-02-05 LAB — RENAL FUNCTION PANEL
Albumin: 3.2 g/dL — ABNORMAL LOW (ref 3.5–5.0)
Anion gap: 10 (ref 5–15)
BUN: 38 mg/dL — ABNORMAL HIGH (ref 8–23)
CO2: 24 mmol/L (ref 22–32)
Calcium: 8.9 mg/dL (ref 8.9–10.3)
Chloride: 106 mmol/L (ref 98–111)
Creatinine, Ser: 1.88 mg/dL — ABNORMAL HIGH (ref 0.44–1.00)
GFR, Estimated: 24 mL/min — ABNORMAL LOW (ref >60)
Glucose, Bld: 79 mg/dL (ref 70–99)
Phosphorus: 3.4 mg/dL (ref 2.5–4.6)
Potassium: 4.2 mmol/L (ref 3.5–5.1)
Sodium: 140 mmol/L (ref 135–145)

## 2024-02-05 LAB — CBC
HCT: 28.7 % — ABNORMAL LOW (ref 36.0–46.0)
Hemoglobin: 9.6 g/dL — ABNORMAL LOW (ref 12.0–15.0)
MCH: 34 pg (ref 26.0–34.0)
MCHC: 33.4 g/dL (ref 30.0–36.0)
MCV: 101.8 fL — ABNORMAL HIGH (ref 80.0–100.0)
Platelets: 101 10*3/uL — ABNORMAL LOW (ref 150–400)
RBC: 2.82 MIL/uL — ABNORMAL LOW (ref 3.87–5.11)
RDW: 13.5 % (ref 11.5–15.5)
WBC: 9.5 10*3/uL (ref 4.0–10.5)
nRBC: 0 % (ref 0.0–0.2)

## 2024-02-05 LAB — MAGNESIUM: Magnesium: 2 mg/dL (ref 1.7–2.4)

## 2024-02-05 MED ORDER — ENSURE ENLIVE PO LIQD
237.0000 mL | Freq: Two times a day (BID) | ORAL | Status: DC
  Administered 2024-02-05 – 2024-02-07 (×5): 237 mL via ORAL

## 2024-02-05 MED ORDER — AMIODARONE HCL 200 MG PO TABS
100.0000 mg | ORAL_TABLET | Freq: Every day | ORAL | Status: DC
  Administered 2024-02-05 – 2024-02-07 (×3): 100 mg via ORAL
  Filled 2024-02-05 (×3): qty 1

## 2024-02-05 MED ORDER — METOPROLOL SUCCINATE ER 25 MG PO TB24
12.5000 mg | ORAL_TABLET | Freq: Every day | ORAL | Status: DC
  Administered 2024-02-05 – 2024-02-07 (×3): 12.5 mg via ORAL
  Filled 2024-02-05 (×3): qty 1

## 2024-02-05 MED ORDER — DM-GUAIFENESIN ER 30-600 MG PO TB12
1.0000 | ORAL_TABLET | Freq: Two times a day (BID) | ORAL | Status: DC
  Administered 2024-02-05 – 2024-02-07 (×5): 1 via ORAL
  Filled 2024-02-05 (×5): qty 1

## 2024-02-05 NOTE — Care Management Important Message (Signed)
Important Message  Patient Details  Name: Claire Kelley MRN: [id number] Date of Birth: 09-Jan-1926   Important Message Given:  Yes - Medicare IM     Cristela Blue, CMA 02/05/2024, 10:12 AM

## 2024-02-05 NOTE — Plan of Care (Signed)
  Problem: Education: Goal: Knowledge of disease or condition will improve Outcome: Progressing Goal: Knowledge of secondary prevention will improve (MUST DOCUMENT ALL) Outcome: Progressing Goal: Knowledge of patient specific risk factors will improve (DELETE if not current risk factor) Outcome: Progressing   Problem: Intracerebral Hemorrhage Tissue Perfusion: Goal: Complications of Intracerebral Hemorrhage will be minimized Outcome: Progressing   Problem: Coping: Goal: Will verbalize positive feelings about self Outcome: Progressing Goal: Will identify appropriate support needs Outcome: Progressing   Problem: Health Behavior/Discharge Planning: Goal: Ability to manage health-related needs will improve Outcome: Progressing Goal: Goals will be collaboratively established with patient/family Outcome: Progressing

## 2024-02-05 NOTE — Progress Notes (Signed)
PHARMACY CONSULT NOTE - FOLLOW UP  Pharmacy Consult for Electrolyte Monitoring and Replacement   Recent Labs: Potassium  Date Value  02/05/2024 4.2 mmol/L  01/23/2014 4.4 mmol/L  01/21/2010 4.2 mEq/L   Magnesium (mg/dL)  Date Value  16/09/9603 2.0   Calcium (mg/dL)  Date Value  54/08/8118 8.9  01/21/2010 9.1   Calcium, Total (mg/dL)  Date Value  14/78/2956 8.7   Albumin (g/dL)  Date Value  21/30/8657 3.2 (L)  01/21/2010 3.5   Phosphorus (mg/dL)  Date Value  84/69/6295 3.4   Sodium  Date Value  02/05/2024 140 mmol/L  01/23/2014 135 mmol/L (L)  01/21/2010 146 mEq/L (H)    Assessment: 89 y.o. female with PMHx significant can't for A.fib on Eliquis, HTN who presented with AMS following a fall at home. Found to have small Cerebellar ICH. Pharmacy is asked to follow and replace electrolytes while in CCU. Scr trending up again. Baseline Scr 1.9.   Goal of Therapy:  Electrolytes WNL  Plan:  No replacement needed Pharmacy will sign off the CCM pharmacy monitoring consult as patient has transferred to floor. May reconsult if desired.   Angelique Blonder ,PharmD Clinical Pharmacist 02/05/2024 10:39 AM

## 2024-02-05 NOTE — Progress Notes (Addendum)
Physical Therapy Treatment Patient Details Name: Claire Kelley MRN: [id number] DOB: July 19, 1926 Today's Date: 02/05/2024   History of Present Illness Pt is a 89 y/o F admitted on 02/02/24 after presenting with c/o AMS following a fall at home. 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 PMH: a-fib on eliquis, HTN, AICD, CAD, HTN, high cholesterol, CKD    PT Comments  Pt awake this AM with attentive daughters in room.  She is able to get to EOB with max a x 1 with use of bed features.  Initially needs min a x 1 to remain sitting due to L with some post lean but after about 15 minutes she is able to maintain balance for short periods of time.  Cues and encouragement to do LE AROM in sitting but limited motion due to balance and general weakness.  Standing and transfers deferred at this time given general weakness focusing on pre-standing/gait activities.   Pt has made overall improvements in her ability to participate in care and is appropriate in conversation and responses.  Global weakness remains along with balance deficits.  At this time, pt would need +2 for transfers and would likely be more comfortable with Michiel Sites style lift transfer in hopes of increasing strength with further time/HHPT/OT therapies.  Per discussion with TOC ALF cannot do Hoyer transfers so +2 assist would be provided.  She was able to tolerate sitting x 15 minutes with minimal support and could likely tolerate extended time in recliner for meals and activities.  Family does hope pt can return to [facility] and transition to Memory Care with increased support from staff.     If plan is discharge home, recommend the following: Two people to help with walking and/or transfers;A lot of help with bathing/dressing/bathroom;Assist for transportation;Help with stairs or ramp for entrance;Assistance with cooking/housework   Can travel by private vehicle        Equipment Recommendations  None  recommended by PT    Recommendations for Other Services       Precautions / Restrictions Precautions Precautions: Fall Restrictions Weight Bearing Restrictions Per Provider Order: No     Mobility  Bed Mobility Overal bed mobility: Needs Assistance Bed Mobility: Supine to Sit, Sit to Supine     Supine to sit: Max assist Sit to supine: Max assist     Patient Response: Cooperative  Transfers                        Ambulation/Gait                   Stairs             Wheelchair Mobility     Tilt Bed Tilt Bed Patient Response: Cooperative  Modified Rankin (Stroke Patients Only)       Balance Overall balance assessment: Needs assistance Sitting-balance support: Feet unsupported, Bilateral upper extremity supported, No upper extremity supported Sitting balance-Leahy Scale: Poor Sitting balance - Comments: able to maintain balance for short periods of time but often with L lean needing support and cues to correct                                    Communication    Cognition Arousal: Alert Behavior During Therapy: [facility] for tasks assessed/performed   PT - Cognitive impairments: History of cognitive impairments  Cueing    Exercises Other Exercises Other Exercises: cues for seated AROM at EOB but limited motion    General Comments        Pertinent Vitals/Pain Pain Assessment Pain Assessment: No/denies pain Pain Intervention(s): Monitored during session    Home Living                          Prior Function            PT Goals (current goals can now be found in the care plan section) Progress towards PT goals: Progressing toward goals    Frequency    Min 1X/week      PT Plan      Co-evaluation              AM-PAC PT "6 Clicks" Mobility   Outcome Measure  Help needed turning from your back to your side while in a flat bed without using  bedrails?: Total Help needed moving from lying on your back to sitting on the side of a flat bed without using bedrails?: Total Help needed moving to and from a bed to a chair (including a wheelchair)?: Total Help needed standing up from a chair using your arms (e.g., wheelchair or bedside chair)?: Total Help needed to walk in hospital room?: Total Help needed climbing 3-5 steps with a railing? : Total 6 Click Score: 6    End of Session   Activity Tolerance: Patient tolerated treatment well;Patient limited by fatigue Patient left: in bed;with call bell/phone within reach;with bed alarm set;with family/visitor present Nurse Communication: Mobility status PT Visit Diagnosis: Difficulty in walking, not elsewhere classified (R26.2);Other abnormalities of gait and mobility (R26.89)     Time: 6440-3474 PT Time Calculation (min) (ACUTE ONLY): 30 min  Charges:    $Therapeutic Activity: 23-37 mins PT General Charges $$ ACUTE PT VISIT: 1 Visit                   Danielle Dess, PTA 02/05/24, 12:37 PM

## 2024-02-05 NOTE — Progress Notes (Signed)
Speech Language Pathology Treatment: Dysphagia  Patient Details Name: Claire Kelley MRN: [id number] DOB: 1926/07/01 Today's Date: 02/05/2024 Time: 4098-1191 SLP Time Calculation (min) (ACUTE ONLY): 20 min  Assessment / Plan / Recommendation Clinical Impression  Pt seen for follow up dysphagia intervention. Pt initially more responsive/alert sitting at edge of bed with PT upon therapist entrance to room. However, limited endurance noted, leading to pt needing to sit back in bed and pt request for rest following completion of PO trials. Pt seen with trials of thin liquids (via straw), puree, and regular solids. Hand over hand for holding cup provided. Extended time required for obtaining labial pull on straw, with pt independently managing small, single sips. Extended time also required for mastication and clearance of regular solids. Min oral residue following completion, with wash provided for clearance of residue. Daughters reported minimal intake/low appetite.   Recommend advancement to Dys 2 (chopped) with thin liquids. Continued aspiration precautions (slow rate, small bites elevated HOB, alert/sustained alertness for duration of eating, and elevated HOB). Utilize liquid wash and intermittent visual checks to ensure complete oral clearance with solids and medications. Education to pt's daughters in room regarding diet recommendations and precautions. Both reported understanding. SLP to continue to monitor for dysphagia and readiness for speech language evaluation (held today per pt's request for rest). Recommend follow up in next level of care for dysphagia and cognition. RN aware of recommendations.    HPI HPI: 89 y.o. female, DNR/DNI,  with PMHx significant can't for A.fib on Eliquis, HTN who presented with AMS following a fall at home.  Found to have small Cerebellar ICH. CT Head 02/02/24: 1. Slight enlargement of a small acute hemorrhage in the cerebellar  vermis, now 7 mm. No significant edema  or mass effect. No new intracranial abnormality. Moderate chronic small vessel ischemic disease.      SLP Plan  Continue with current plan of care      Recommendations for follow up therapy are one component of a multi-disciplinary discharge planning process, led by the attending physician.  Recommendations may be updated based on patient status, additional functional criteria and insurance authorization.    Recommendations  Diet recommendations: Dysphagia 2 (fine chop);Thin liquid Liquids provided via: Cup;Straw Medication Administration: Whole meds with puree Supervision: Staff to assist with self feeding;Full supervision/cueing for compensatory strategies Compensations: Minimize environmental distractions;Slow rate;Small sips/bites;Follow solids with liquid Postural Changes and/or Swallow Maneuvers: Seated upright 90 degrees;Upright 30-60 min after meal                  Oral care BID;Oral care before and after PO   Frequent or constant Supervision/Assistance (for PO intake) Dysphagia, oral phase (R13.11)     Continue with current plan of care    [place] Natika Geyer Clapp, MS, CCC-SLP Speech Language Pathologist Rehab Services; [facility] [phone] (ascom)   [place] J Clapp  02/05/2024, 1:52 PM

## 2024-02-05 NOTE — Progress Notes (Addendum)
Progress Note   Patient: Claire Kelley ZOX:[id number] DOB: Dec 07, 1926 DOA: 02/02/2024     3 DOS: the patient was seen and examined on 02/05/2024    Brief hospital course: 89 y.o. female with PMHx significant can't for A.fib on Eliquis, HTN who presented with AMS following a fall at home. Found to have small Cerebellar ICH, Neurosurgery following and not recommending surgical intervention. Palliative consulted following goals of care conversation with pts daughter pt will discharge to hospice at her previous facility vs. hospice home.  2/14: Presented to ED with worsening confusion following fall at home.  Found to have small Cerebellar ICH, Eliquis reversed in ED. Neurosurgery consulted, no surgical intervention indicated.  PCCM asked to admit.  Neurology consulted.  Patient with worsening confusion and agitation, repeat CT with slight enlargement of bleed but no mass effect or edema.  Requiring Precedex for agitation.  Palliative care consulted 2/15: Palliative Care Team consulted and discussed goals of care following discussion pt transitioning to comfort/hospice disposition.  Weaning precedex gtt off and will transfer service to [facility] to pick up 02/16        Assessment and Plan:    #Small cerebellar ICH status post fall #Acute metabolic encephalopathy-improved #Delirium PMHx: A.fib on Eliquis -Continue neurochecks  Eliquis discontinued Maintain adequate blood pressure control Patient was seen by neurosurgery and no surgical intervention recommended Continue PT OT TOC working on memory care facility Patient's family have agreed for discharge with hospice   #Acute kidney injury~improving  #Mild Hyponatremia~improving Creatinine currently at baseline which is 1.8 Continue to monitor renal function and electrolyte   #Anemia #Mild Thrombocytopenia~worsening  Monitor CBC closely    History of AAA (abdominal aortic aneurysm) [facility]): S/p of EVAR. Our radiologist reported enlargement of  aneurysm with zide of 10.3 x 9.7 cm, however recent CT scan done at the [facility] on 10/12/2023 showed size of 7.9 x 9.9 cm. The size seems to be similar.  Anyway patient will never consider another surgical treatment or any procedure per her daughter, Therefore will not not consult VVS. We will continue to observe closely   Hypertension -Continue metoprolol   Coronary artery disease: No chest pain -Patient stopped taking Crestor per her daughter     Atrial fibrillation, chronic (HCC) -Continue metoprolol and amiodarone Eliquis on hold in the setting of ICH     Chronic diastolic CHF (congestive heart failure) (HCC): 2D echo on 04/26/2023 showed EF > 50% with grade 3 diastolic dysfunction.  Patient does not have leg edema JVD.  CHF seem to be compensated. Continue to monitor daily weight Monitor input and output    Diet/type: DYS level 1 diet   DVT prophylaxis: SCD   GI prophylaxis: PPI     Subjective:  I discussed the case with patient's 2 daughters at bedside today Denies nausea or vomiting or chest pain   Physical Exam: General: Acute on chronically ill-appearing elderly female, laying in bed, on room air, in no acute distress HENT: Atraumatic, normocephalic, neck supple, no JVD Lungs: Clear throughout, even, non labored  Cardiovascular: Paced rhythm, no m/r/g, 2+ radial/1+ distal pulses, no edema Abdomen: +BS x4, soft, non tender, non distended  Extremities: Normal bulk and tone, no deformities Neuro: Appears calm but fidgety  GU: External catheter in place     Data Reviewed:    Latest Ref Rng & Units 02/05/2024    6:39 AM 02/04/2024    5:34 AM 02/03/2024    3:46 AM  BMP  Glucose 70 - 99 mg/dL  79  88  127   BUN 8 - 23 mg/dL 38  35  33   Creatinine 0.44 - 1.00 mg/dL 8.65  7.84  6.96   Sodium 135 - 145 mmol/L 140  139  136   Potassium 3.5 - 5.1 mmol/L 4.2  4.5  4.3   Chloride 98 - 111 mmol/L 106  105  104   CO2 22 - 32 mmol/L 24  23  22    Calcium 8.9 - 10.3 mg/dL 8.9   9.0  8.8      Vitals:   02/05/24 0556 02/05/24 0810 02/05/24 1129 02/05/24 1648  BP: (!) 157/109 (!) 167/85 (!) 155/59 (!) 159/91  Pulse: 77 87 60 63  Resp: 18 18  18   Temp: 98.1 F (36.7 C) 97.6 F (36.4 C)  97.9 F (36.6 C)  TempSrc:      SpO2: 98% 100% 100% 100%  Weight:      Height:          Latest Ref Rng & Units 02/05/2024    6:39 AM 02/04/2024    5:34 AM 02/03/2024    3:45 AM  CBC  WBC 4.0 - 10.5 K/uL 9.5  11.6  5.9   Hemoglobin 12.0 - 15.0 g/dL 9.6  9.8  8.8   Hematocrit 36.0 - 46.0 % 28.7  29.1  26.7   Platelets 150 - 400 K/uL 101  101  91      Author: Loyce Dys, MD 02/05/2024 4:53 PM  For on call review [email or web address].

## 2024-02-05 NOTE — TOC Progression Note (Signed)
Transition of Care [facility]) - Progression Note    Patient Details  Name: Claire Kelley MRN: [id number] Date of Birth: 02/20/1926  Transition of Care [facility]) CM/SW Contact  Allena Katz, LCSW Phone Number: 02/05/2024, 9:21 AM  Clinical Narrative:  CSW spoke with Misty Stanley from [place]. She states their nurse sherry is coming to assess this morning to see if pt can return to memory care.     Expected Discharge Plan: Skilled Nursing Facility Barriers to Discharge: Continued Medical Work up  Expected Discharge Plan and Services       Living arrangements for the past 2 months: Assisted Living Facility                                       Social Determinants of Health (SDOH) Interventions SDOH Screenings   Food Insecurity: No Food Insecurity (02/02/2024)  Housing: Low Risk  (02/02/2024)  Transportation Needs: No Transportation Needs (02/02/2024)  Utilities: Not At Risk (02/02/2024)  Financial Resource Strain: Low Risk  (04/29/2023)   Received from [facility], [facility]  Social Connections: Moderately Isolated (02/02/2024)  Tobacco Use: Medium Risk (02/02/2024)    Readmission Risk Interventions     No data to display

## 2024-02-05 NOTE — TOC Progression Note (Signed)
Transition of Care [facility]) - Progression Note    Patient Details  Name: Claire Kelley MRN: [id number] Date of Birth: 1926/10/29  Transition of Care [facility]) CM/SW Contact  Allena Katz, LCSW Phone Number: 02/05/2024, 4:04 PM  Clinical Narrative:   [name] states they can take pt back if she is not using a hoyer.     Expected Discharge Plan: Skilled Nursing Facility Barriers to Discharge: Continued Medical Work up  Expected Discharge Plan and Services       Living arrangements for the past 2 months: Assisted Living Facility                                       Social Determinants of Health (SDOH) Interventions SDOH Screenings   Food Insecurity: No Food Insecurity (02/02/2024)  Housing: Low Risk  (02/02/2024)  Transportation Needs: No Transportation Needs (02/02/2024)  Utilities: Not At Risk (02/02/2024)  Financial Resource Strain: Low Risk  (04/29/2023)   Received from [facility], [facility]  Social Connections: Moderately Isolated (02/02/2024)  Tobacco Use: Medium Risk (02/02/2024)    Readmission Risk Interventions     No data to display

## 2024-02-06 DIAGNOSIS — R4182 Altered mental status, unspecified: Secondary | ICD-10-CM | POA: Diagnosis not present

## 2024-02-06 DIAGNOSIS — I614 Nontraumatic intracerebral hemorrhage in cerebellum: Secondary | ICD-10-CM | POA: Diagnosis not present

## 2024-02-06 LAB — CBC
HCT: 29.8 % — ABNORMAL LOW (ref 36.0–46.0)
Hemoglobin: 9.8 g/dL — ABNORMAL LOW (ref 12.0–15.0)
MCH: 33.2 pg (ref 26.0–34.0)
MCHC: 32.9 g/dL (ref 30.0–36.0)
MCV: 101 fL — ABNORMAL HIGH (ref 80.0–100.0)
Platelets: 96 10*3/uL — ABNORMAL LOW (ref 150–400)
RBC: 2.95 MIL/uL — ABNORMAL LOW (ref 3.87–5.11)
RDW: 13.3 % (ref 11.5–15.5)
WBC: 8.7 10*3/uL (ref 4.0–10.5)
nRBC: 0 % (ref 0.0–0.2)

## 2024-02-06 LAB — RENAL FUNCTION PANEL
Albumin: 3.1 g/dL — ABNORMAL LOW (ref 3.5–5.0)
Anion gap: 11 (ref 5–15)
BUN: 40 mg/dL — ABNORMAL HIGH (ref 8–23)
CO2: 24 mmol/L (ref 22–32)
Calcium: 9.2 mg/dL (ref 8.9–10.3)
Chloride: 106 mmol/L (ref 98–111)
Creatinine, Ser: 1.56 mg/dL — ABNORMAL HIGH (ref 0.44–1.00)
GFR, Estimated: 30 mL/min — ABNORMAL LOW (ref >60)
Glucose, Bld: 90 mg/dL (ref 70–99)
Phosphorus: 3.3 mg/dL (ref 2.5–4.6)
Potassium: 4 mmol/L (ref 3.5–5.1)
Sodium: 141 mmol/L (ref 135–145)

## 2024-02-06 NOTE — Plan of Care (Signed)
  Problem: Education: Goal: Knowledge of disease or condition will improve Outcome: Not Progressing   Problem: Health Behavior/Discharge Planning: Goal: Ability to manage health-related needs will improve Outcome: Not Progressing   Problem: Self-Care: Goal: Ability to participate in self-care as condition permits will improve Outcome: Not Progressing   

## 2024-02-06 NOTE — Progress Notes (Signed)
Progress Note   Patient: Claire Kelley NWG:[id number] DOB: 07-Nov-1926 DOA: 02/02/2024     4 DOS: the patient was seen and examined on 02/06/2024    Brief hospital course: 89 y.o. female with PMHx significant can't for A.fib on Eliquis, HTN who presented with AMS following a fall at home. Found to have small Cerebellar ICH, Neurosurgery following and not recommending surgical intervention. Palliative consulted following goals of care conversation with pts daughter pt will discharge to hospice at her previous facility vs. hospice home.  2/14: Presented to ED with worsening confusion following fall at home.  Found to have small Cerebellar ICH, Eliquis reversed in ED. Neurosurgery consulted, no surgical intervention indicated.  PCCM asked to admit.  Neurology consulted.  Patient with worsening confusion and agitation, repeat CT with slight enlargement of bleed but no mass effect or edema.  Requiring Precedex for agitation.  Palliative care consulted 2/15: Palliative Care Team consulted and discussed goals of care following discussion pt transitioning to comfort/hospice disposition.  Weaning precedex gtt off and will transfer service to [facility] to pick up 02/16        Assessment and Plan:    #Small cerebellar ICH status post fall #Acute metabolic encephalopathy-improved #Delirium PMHx: A.fib on Eliquis -Continue neurochecks  Eliquis discontinued Maintain adequate blood pressure control Patient was seen by neurosurgery and no surgical intervention recommended Continue PT OT TOC working on memory care facility Patient's family have agreed for discharge with hospice   #Acute kidney injury~improving  #Mild Hyponatremia~improving Creatinine currently at baseline which is 1.8 Continue to monitor renal function and electrolyte   #Anemia #Mild Thrombocytopenia~worsening  Monitor CBC closely     History of AAA (abdominal aortic aneurysm) [facility]): S/p of EVAR. Our radiologist reported enlargement of  aneurysm with zide of 10.3 x 9.7 cm, however recent CT scan done at the [facility] on 10/12/2023 showed size of 7.9 x 9.9 cm. The size seems to be similar.  Anyway patient will never consider another surgical treatment or any procedure per her daughter, Therefore will not not consult VVS. We will continue to observe closely   Hypertension Continue metoprolol   Coronary artery disease: No chest pain -Patient stopped taking Crestor per her daughter     Atrial fibrillation, chronic (HCC) -Continue metoprolol and amiodarone Eliquis on hold in the setting of ICH     Chronic diastolic CHF (congestive heart failure) (HCC): 2D echo on 04/26/2023 showed EF > 50% with grade 3 diastolic dysfunction.  Patient does not have leg edema JVD.  CHF seem to be compensated. Continue to monitor daily weight Continue to monitor input and output     Diet/type: DYS level 1 diet   DVT prophylaxis: SCD   GI prophylaxis: PPI     Subjective:  Patient seen and examined at bedside this morning Currently awaiting placement Denies nausea vomiting chest pain cough   Physical Exam: General: Acute on chronically ill-appearing elderly female, laying in bed, on room air, in no acute distress HENT: Atraumatic, normocephalic, neck supple, no JVD Lungs: Clear throughout, even, non labored  Cardiovascular: Paced rhythm, no m/r/g, 2+ radial/1+ distal pulses, no edema Abdomen: +BS x4, soft, non tender, non distended  Extremities: Normal bulk and tone, no deformities Neuro: Appears calm but fidgety  GU: External catheter in place      Data Reviewed:     Latest Ref Rng & Units 02/06/2024    5:54 AM 02/05/2024    6:39 AM 02/04/2024    5:34 AM  CBC  WBC 4.0 - 10.5 K/uL 8.7  9.5  11.6   Hemoglobin 12.0 - 15.0 g/dL 9.8  9.6  9.8   Hematocrit 36.0 - 46.0 % 29.8  28.7  29.1   Platelets 150 - 400 K/uL 96  101  101        Latest Ref Rng & Units 02/06/2024    5:54 AM 02/05/2024    6:39 AM 02/04/2024    5:34 AM  BMP   Glucose 70 - 99 mg/dL 90  79  88   BUN 8 - 23 mg/dL 40  38  35   Creatinine 0.44 - 1.00 mg/dL 1.61  0.96  0.45   Sodium 135 - 145 mmol/L 141  140  139   Potassium 3.5 - 5.1 mmol/L 4.0  4.2  4.5   Chloride 98 - 111 mmol/L 106  106  105   CO2 22 - 32 mmol/L 24  24  23    Calcium 8.9 - 10.3 mg/dL 9.2  8.9  9.0     Vitals:   02/06/24 0522 02/06/24 0529 02/06/24 0910 02/06/24 1148  BP: (!) 175/89 (!) 156/71 123/71 107/87  Pulse: (!) 106 81 62 (!) 59  Resp: 17 18  18   Temp:  97.6 F (36.4 C)    TempSrc:  Axillary    SpO2: 98% 98% 96% 100%  Weight:      Height:       Disposition: Patient medically ready pending placement  Author: Loyce Dys, MD 02/06/2024 4:23 PM  For on call review [email or web address].

## 2024-02-06 NOTE — TOC Progression Note (Signed)
Transition of Care [facility]) - Progression Note    Patient Details  Name: Claire Kelley MRN: [id number] Date of Birth: 07/18/1926  Transition of Care [facility]) CM/SW Contact  Garret Reddish, RN Phone Number: 02/06/2024, 7:51 PM  Clinical Narrative:    Chart reviewed. I have spoken with Mrs. Mullis patient's daughter.  Mrs. Theresa Mulligan informs me that prior to admission her mother was in the memory care unit at [facility].  She reports that she received a call from [place] with [name] offering Mrs. Hojnacki a bed in [facility]'s Memory Care unit.  She also informs me that Stafford Courthouse from [facility] also give her a room number, room 64 an spoke to her about contract for patient's room.  I have informed Mrs. Mullis that I have spoken to [employer], Facilities manager at [facility] and that I was informed that they had not made a decision to as to weather Mrs. Cove is able to return.    16:45 I have spoken with Whitney Post with [name] and she informs me that no decision has been made at this time.    I have update Jiles Crocker, CM Supervisor, Dr. Meriam Sprague, and Dr. Consuelo Pandy about case.  TOC will continue to follow for discharge planning.     Expected Discharge Plan: Skilled Nursing Facility Barriers to Discharge: Continued Medical Work up  Expected Discharge Plan and Services       Living arrangements for the past 2 months: Assisted Living Facility                                       Social Determinants of Health (SDOH) Interventions SDOH Screenings   Food Insecurity: No Food Insecurity (02/02/2024)  Housing: Low Risk  (02/02/2024)  Transportation Needs: No Transportation Needs (02/02/2024)  Utilities: Not At Risk (02/02/2024)  Financial Resource Strain: Low Risk  (04/29/2023)   Received from [facility], [facility]  Social Connections: Moderately Isolated (02/02/2024)  Tobacco Use: Medium Risk (02/02/2024)    Readmission Risk  Interventions     No data to display

## 2024-02-06 NOTE — TOC Progression Note (Signed)
Transition of Care [facility]) - Progression Note    Patient Details  Name: Claire Kelley MRN: [id number] Date of Birth: Jan 13, 1926  Transition of Care [facility]) CM/SW Contact  Garret Reddish, RN Phone Number: 02/06/2024, 2:33 PM  Clinical Narrative:    I have spoken with Whitney Post, Nurse Manager with [name].  She informs me that their team has not made a decision on if they are able to accept the patient back. I have informed Whitney Post that is stable for discharge and will need to know as soon as possible if they are able to accept patient back. Updated PT note was sent and received by facility this am.     TOC will continue to follow for discharge planning.    Expected Discharge Plan: Skilled Nursing Facility Barriers to Discharge: Continued Medical Work up  Expected Discharge Plan and Services       Living arrangements for the past 2 months: Assisted Living Facility                                       Social Determinants of Health (SDOH) Interventions SDOH Screenings   Food Insecurity: No Food Insecurity (02/02/2024)  Housing: Low Risk  (02/02/2024)  Transportation Needs: No Transportation Needs (02/02/2024)  Utilities: Not At Risk (02/02/2024)  Financial Resource Strain: Low Risk  (04/29/2023)   Received from [facility], [facility]  Social Connections: Moderately Isolated (02/02/2024)  Tobacco Use: Medium Risk (02/02/2024)    Readmission Risk Interventions     No data to display

## 2024-02-06 NOTE — Progress Notes (Signed)
Occupational Therapy Treatment Patient Details Name: Claire Kelley MRN: [id number] DOB: [birth year] Today's Date: 02/06/2024   History of present illness Pt is a 89 y/o F admitted on 02/02/24 after presenting with c/o AMS following a fall at home. 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 PMH: a-fib on eliquis, HTN, AICD, CAD, HTN, high cholesterol, CKD   OT comments  Pt alert to self and location, intermittently agitated and restless with OT. Daughter present start and end of session. Pt received in recliner with L lateral lean, requiring support from pillows. Pt performs UB/LE ROM seated, max multimodal cuing to facilitate increased midline postural orientation and improve righting reactions. Pt able to achieve upright balance for ~10 sec seated without requiring physical assist to correct. 2x trials of sit<>stand transfers, pt able to rise with maxA, unable to archive full upright standing or place hands for bilateral stability. OT facilitates repositioning for comfort and postural control in recliner. Pt left in recliner, needs within reach, alarm activated and daughter entering room. OT will continue to see pt on trial basis. Pt will require +2 assist for safe transfers, and benefit from [place] lift if possible. Family would like pt to return to [facility] and participate in memory care with higher levels of staff support.       If plan is discharge home, recommend the following:  Two people to help with walking and/or transfers;Two people to help with bathing/dressing/bathroom;Assistance with cooking/housework;Assist for transportation;Help with stairs or ramp for entrance;Assistance with feeding;Direct supervision/assist for financial management;Direct supervision/assist for medications management   Equipment Recommendations  Other (comment)    Recommendations for Other Services      Precautions / Restrictions Precautions Precautions:  Fall Recall of Precautions/Restrictions: Impaired Precaution/Restrictions Comments: L lateral lean Restrictions Weight Bearing Restrictions Per Provider Order: No       Mobility Bed Mobility               General bed mobility comments: NT, pt up in recliner upon arrival and requests to be left in recliner    Transfers Overall transfer level: Needs assistance Equipment used: Rolling walker (2 wheels) Transfers: Sit to/from Stand Sit to Stand: Max assist           General transfer comment: pt performed 2x sit to stand transfers with max assist, L lateral lean, unable to motor plan hand placement on RW for stability, unable to fully achieve standing with posterior/lateral lean, cannot bring hips through transition     Balance Overall balance assessment: Needs assistance Sitting-balance support: Feet supported Sitting balance-Leahy Scale: Poor Sitting balance - Comments: very poor sitting balance, L lateral lean seated in recliner, poor righting reactions to midline even with max multimodal cuing Postural control: Posterior lean, Left lateral lean Standing balance support: Bilateral upper extremity supported, During functional activity Standing balance-Leahy Scale: Zero Standing balance comment: posterior lean, unable to fully achive upright standing                           ADL either performed or assessed with clinical judgement   ADL Overall ADL's : Needs assistance/impaired                                     Functional mobility during ADLs: Maximal assistance;Cueing for sequencing;Cueing for safety;Rolling walker (2 wheels) General ADL Comments: Pt declines ADL  performance this date. Session focused on functional transfers and UB/LB movement with orientation to midline.     Communication Communication Communication: Impaired Factors Affecting Communication: Reduced clarity of speech   Cognition Arousal: Alert Behavior During  Therapy: Agitated (agitated at times, but redirectable) Cognition: Cognition impaired   Orientation impairments: Time, Situation Awareness: Intellectual awareness impaired Memory impairment (select all impairments): Short-term memory, Working memory Attention impairment (select first level of impairment): Focused attention Executive functioning impairment (select all impairments): Initiation, Organization, Sequencing, Reasoning, Problem solving OT - Cognition Comments: Inconsistently followed single step commands. Poor insight and safety awareness. Picking at items in air while seated in recliner.                 Following commands: Impaired Following commands impaired: Follows one step commands inconsistently, Follows one step commands with increased time      Cueing   Cueing Techniques: Verbal cues, Tactile cues  Exercises Exercises: General Upper Extremity, General Lower Extremity General Exercises - Upper Extremity Shoulder Flexion: AROM, Both, 5 reps, Seated General Exercises - Lower Extremity Hip Flexion/Marching: Both, 5 reps, Strengthening, AROM       General Comments Placed blankets and towels around recliner for skin protection  - pt seems to bruise easily    Pertinent Vitals/ Pain       Pain Assessment Pain Assessment: No/denies pain   Frequency  Min 1X/week        Progress Toward Goals  OT Goals(current goals can now be found in the care plan section)  Progress towards OT goals: Progressing toward goals (Will continue on OT trial. Pt completed 1 of 3 sessions. Daughter notes they are thinking about memory care at d/c.)  Acute Rehab OT Goals OT Goal Formulation: With family Time For Goal Achievement: 02/17/24 Potential to Achieve Goals: Poor ADL Goals Pt Will Perform Lower Body Dressing: with min assist;sitting/lateral leans;sit to/from stand Pt Will Transfer to Toilet: with min assist;bedside commode;ambulating Pt Will Perform Toileting - Clothing  Manipulation and hygiene: with min assist;sitting/lateral leans;sit to/from stand  Plan         AM-PAC OT "6 Clicks" Daily Activity     Outcome Measure   Help from another person eating meals?: Total Help from another person taking care of personal grooming?: Total Help from another person toileting, which includes using toliet, bedpan, or urinal?: Total Help from another person bathing (including washing, rinsing, drying)?: Total Help from another person to put on and taking off regular upper body clothing?: Total Help from another person to put on and taking off regular lower body clothing?: Total 6 Click Score: 6    End of Session Equipment Utilized During Treatment: Oxygen  OT Visit Diagnosis: Other abnormalities of gait and mobility (R26.89);Other symptoms and signs involving cognitive function   Activity Tolerance Other (comment) (limited by mental status)   Patient Left in chair;with call bell/phone within reach;with chair alarm set;with family/visitor present   Nurse Communication Mobility status        Time: 0272-5366 OT Time Calculation (min): 29 min  Charges: OT General Charges $OT Visit: 1 Visit OT Treatments $Therapeutic Activity: 23-37 mins  Aaryana Betke L. Tamari Busic, OTR/L  02/06/24, 3:55 PM

## 2024-02-06 NOTE — Progress Notes (Signed)
Physical Therapy Treatment Patient Details Name: Claire Kelley MRN: [id number] DOB: Apr 09, 1926 Today's Date: 02/06/2024   History of Present Illness Pt is a 89 y/o F admitted on 02/02/24 after presenting with c/o AMS following a fall at home. 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 PMH: a-fib on eliquis, HTN, AICD, CAD, HTN, high cholesterol, CKD    PT Comments  Pt was supine in bed upon arrival. She is A but disoriented overall. She does know she is in hospital however unaware why. PT is pleasant and cooperative. Does require increased time to process and respond. Mumbled speech but comprehendible. Pt was able to exit L side of bed with +1 extensive assistance. Sat EOB x ~ 5 minutes. Posterior lean throughout. Pt requires constant assistance to prevent LOB in sitting and in standing. PT was able to stand several times EOB prior to taking steps over to recliner. She proceeded to stand 1 x from recliner > RW and performed marching in place before c/o fatigue. Author assisted pt with ordering breakfast and hygiene care after urination. Pt is severely incontinent. Acute PT will continue to follow per current POC progressing as able. DC recs remain appropriate.       If plan is discharge home, recommend the following: A lot of help with walking and/or transfers;Assistance with cooking/housework;Assistance with feeding;Direct supervision/assist for medications management;Direct supervision/assist for financial management;Assist for transportation;Help with stairs or ramp for entrance;Supervision due to cognitive status;Two people to help with bathing/dressing/bathroom     Equipment Recommendations  Other (comment) (Defer to next levelo of care)       Precautions / Restrictions Precautions Precautions: Fall Restrictions Weight Bearing Restrictions Per Provider Order: No     Mobility  Bed Mobility Overal bed mobility: Needs Assistance Bed Mobility:  Supine to Sit  Supine to sit: Mod assist, Max assist, Used rails (flat bed)  General bed mobility comments: pt was able to achieve EOB sitting with +1 assistance    Transfers Overall transfer level: Needs assistance Equipment used: Rolling walker (2 wheels) Transfers: Sit to/from Stand Sit to Stand: Mod assist, From elevated surface  General transfer comment: pt stood 2 x EOB prior to taking steps over to recliner. stood 1 x from recliner. +1 assistance however Chartered loss adjuster encouraged RN staff to have +2 for additional  safety    Ambulation/Gait Ambulation/Gait assistance: Mod assist Gait Distance (Feet): 3 Feet Assistive device: Rolling walker (2 wheels) Gait Pattern/deviations: Step-to pattern, Narrow base of support Gait velocity: decreased  General Gait Details: Pt was able to take a few shuflling steps form EOB to recliner. increased tie + constant +1 assistance.    Balance Overall balance assessment: Needs assistance Sitting-balance support: Feet supported Sitting balance-Leahy Scale: Poor Sitting balance - Comments: pt requires constant assistance and vcs for fwd wt shift. poor sitting balance and righting reactions   Standing balance support: Bilateral upper extremity supported, During functional activity Standing balance-Leahy Scale: Poor Standing balance comment: pt is at high fall risk even with BUE support on RW. tends to have posterior lean/push      Communication Communication Communication: Impaired (mumbled speech) Factors Affecting Communication: Reduced clarity of speech  Cognition Arousal: Alert Behavior During Therapy: WFL for tasks assessed/performed   PT - Cognitive impairments: History of cognitive impairments   Orientation impairments: Time, Situation    PT - Cognition Comments: pt is alert and follows commands howevber is disoriented to situation and time Following commands: Intact  Cueing Cueing Techniques: Verbal cues, Tactile cues     General  Comments General comments (skin integrity, edema, etc.): pt is incontinent of urine. Required total assist for hygfiene care after urination. puewick in place at conclusion of session. Assisted pt with ordering breakfast.      Pertinent Vitals/Pain Pain Assessment Pain Assessment: No/denies pain Breathing: normal     PT Goals (current goals can now be found in the care plan section) Acute Rehab PT Goals Patient Stated Goal: none stated but pt does mention going home a few time during session Progress towards PT goals: Progressing toward goals    Frequency    Min 1X/week           Co-evaluation     PT goals addressed during session: Mobility/safety with mobility;Balance;Proper use of DME;Strengthening/ROM        AM-PAC PT "6 Clicks" Mobility   Outcome Measure  Help needed turning from your back to your side while in a flat bed without using bedrails?: A Lot Help needed moving from lying on your back to sitting on the side of a flat bed without using bedrails?: A Lot Help needed moving to and from a bed to a chair (including a wheelchair)?: A Lot Help needed standing up from a chair using your arms (e.g., wheelchair or bedside chair)?: A Lot Help needed to walk in hospital room?: A Lot Help needed climbing 3-5 steps with a railing? : Total 6 Click Score: 11    End of Session   Activity Tolerance: Patient tolerated treatment well Patient left: in chair;with call bell/phone within reach;with chair alarm set;with nursing/sitter in room Nurse Communication: Mobility status PT Visit Diagnosis: Difficulty in walking, not elsewhere classified (R26.2);Other abnormalities of gait and mobility (R26.89)     Time: 1610-9604 PT Time Calculation (min) (ACUTE ONLY): 26 min  Charges:    $Therapeutic Activity: 23-37 mins PT General Charges $$ ACUTE PT VISIT: 1 Visit                    Jetta Lout PTA 02/06/24, 9:02 AM

## 2024-02-07 DIAGNOSIS — L8990 Pressure ulcer of unspecified site, unspecified stage: Secondary | ICD-10-CM | POA: Insufficient documentation

## 2024-02-07 DIAGNOSIS — R4182 Altered mental status, unspecified: Secondary | ICD-10-CM | POA: Diagnosis not present

## 2024-02-07 DIAGNOSIS — R627 Adult failure to thrive: Secondary | ICD-10-CM

## 2024-02-07 DIAGNOSIS — I614 Nontraumatic intracerebral hemorrhage in cerebellum: Secondary | ICD-10-CM | POA: Diagnosis not present

## 2024-02-07 LAB — RENAL FUNCTION PANEL
Albumin: 3.1 g/dL — ABNORMAL LOW (ref 3.5–5.0)
Anion gap: 13 (ref 5–15)
BUN: 47 mg/dL — ABNORMAL HIGH (ref 8–23)
CO2: 20 mmol/L — ABNORMAL LOW (ref 22–32)
Calcium: 9.2 mg/dL (ref 8.9–10.3)
Chloride: 108 mmol/L (ref 98–111)
Creatinine, Ser: 1.68 mg/dL — ABNORMAL HIGH (ref 0.44–1.00)
GFR, Estimated: 28 mL/min — ABNORMAL LOW (ref >60)
Glucose, Bld: 88 mg/dL (ref 70–99)
Phosphorus: 3.9 mg/dL (ref 2.5–4.6)
Potassium: 4.1 mmol/L (ref 3.5–5.1)
Sodium: 141 mmol/L (ref 135–145)

## 2024-02-07 LAB — CBC
HCT: 31.5 % — ABNORMAL LOW (ref 36.0–46.0)
Hemoglobin: 10.3 g/dL — ABNORMAL LOW (ref 12.0–15.0)
MCH: 33.4 pg (ref 26.0–34.0)
MCHC: 32.7 g/dL (ref 30.0–36.0)
MCV: 102.3 fL — ABNORMAL HIGH (ref 80.0–100.0)
Platelets: 103 10*3/uL — ABNORMAL LOW (ref 150–400)
RBC: 3.08 MIL/uL — ABNORMAL LOW (ref 3.87–5.11)
RDW: 13.6 % (ref 11.5–15.5)
WBC: 6.8 10*3/uL (ref 4.0–10.5)
nRBC: 0 % (ref 0.0–0.2)

## 2024-02-07 NOTE — Progress Notes (Signed)
Speech Language Pathology Treatment: Dysphagia  Patient Details Name: Claire Kelley MRN: [id number] DOB: Apr 24, 1926 Today's Date: 02/07/2024 Time: 4401-0272 SLP Time Calculation (min) (ACUTE ONLY): 15 min  Assessment / Plan / Recommendation Clinical Impression  Pt was initially sleeping and was slow to become alert/awake. Pt politely declined some food (likely d/t being awakened a few moments earlier). Pt was agreeable o several sips of thin liquids via straw. When consuming sips via straw, pt was few of overt s/s of aspiration. While dysphagia 2 diet items were not observed during today's session, continue to recommend current diet with continued skilled observation provided and more trials of upgraded textures prior to further upgrade.    HPI HPI: 89 y.o. female, DNR/DNI,  with PMHx significant can't for A.fib on Eliquis, HTN who presented with AMS following a fall at home.  Found to have small Cerebellar ICH. CT Head 02/02/24: 1. Slight enlargement of a small acute hemorrhage in the cerebellar  vermis, now 7 mm. No significant edema or mass effect. No new intracranial abnormality. Moderate chronic small vessel ischemic disease.      SLP Plan  Continue with current plan of care      Recommendations for follow up therapy are one component of a multi-disciplinary discharge planning process, led by the attending physician.  Recommendations may be updated based on patient status, additional functional criteria and insurance authorization.    Recommendations  Diet recommendations: Dysphagia 2 (fine chop);Thin liquid Liquids provided via: Cup;Straw Medication Administration: Whole meds with puree Supervision: Staff to assist with self feeding;Full supervision/cueing for compensatory strategies Compensations: Minimize environmental distractions;Slow rate;Small sips/bites;Follow solids with liquid Postural Changes and/or Swallow Maneuvers: Seated upright 90 degrees;Upright 30-60 min after meal                   Oral care BID;Oral care before and after PO   Frequent or constant Supervision/Assistance Dysphagia, oral phase (R13.11)     Continue with current plan of care     [name]  02/07/2024, 9:12 AM

## 2024-02-07 NOTE — Plan of Care (Signed)

## 2024-02-07 NOTE — Progress Notes (Addendum)
[facility] Liaison Note:   Received request from Transitions of Care Manager,Faye McLaurin, RN, for hospice services at [facility] after discharge.  Spoke with daughter, [name], to initiate education related to hospice philosophy, services, and team approach to care. Daughter verbalized understanding of information given. Per discussion, the plan is for patient to discharge Thursday or Friday.     Patient will need a hospital bed and over bed table delivered to the facility.  Patient already has home 02 provided by [employer], WC, BSC, standard walker, rollator walker, and shower chair.     Please send signed and completed DNR with patient/family. Please provide prescriptions at discharge as needed to ensure ongoing symptom management.    [facility] information and contact numbers given to family & above information shared with TOC.   Please call with any questions/concerns.    Thank you for the opportunity to participate in this patient's care.01  [facility] Liaison [phone]

## 2024-02-07 NOTE — TOC Progression Note (Addendum)
Transition of Care [facility]) - Progression Note    Patient Details  Name: Claire Kelley MRN: [id number] Date of Birth: Aug 13, 1926  Transition of Care [facility]) CM/SW Contact  Garret Reddish, RN Phone Number: 02/07/2024, 1:31 PM  Clinical Narrative:    Chart reviewed.  I have spoken to [facility] with [facility]. She does confirm that patient is able discharge back to the Memory Care unit with Hospice following.  I have informed Pam that patient's daughter would like to use [facility].  I have made Hospice Referral to [facility] with [facility].  Annice Pih will order patient a Hospital bed and over head table.   [facility] and take patient on tomorrow pending weather and delivery of DME.    TOC will continue to follow for discharge planning.     Expected Discharge Plan: Skilled Nursing Facility Barriers to Discharge: Continued Medical Work up  Expected Discharge Plan and Services       Living arrangements for the past 2 months: Assisted Living Facility                                       Social Determinants of Health (SDOH) Interventions SDOH Screenings   Food Insecurity: No Food Insecurity (02/02/2024)  Housing: Low Risk  (02/02/2024)  Transportation Needs: No Transportation Needs (02/02/2024)  Utilities: Not At Risk (02/02/2024)  Financial Resource Strain: Low Risk  (04/29/2023)   Received from [facility], [facility]  Social Connections: Moderately Isolated (02/02/2024)  Tobacco Use: Medium Risk (02/02/2024)    Readmission Risk Interventions     No data to display

## 2024-02-07 NOTE — Progress Notes (Addendum)
[facility]  - [place] at [facility]   PATIENT NAME: Claire Kelley    MR#:  [id number]  DATE OF BIRTH:  06/21/1926  SUBJECTIVE:  no family at bedside. Patient appears calm but confused. Unable to give any history review system. Poor appetite. Able to tell me daughters names  VITALS:  Blood pressure 118/67, pulse 71, temperature 97.6 F (36.4 C), temperature source Axillary, resp. rate 20, height 5\' 5"  (1.651 m), weight 51.1 kg, SpO2 96%.  PHYSICAL EXAMINATION:  limited exam GENERAL:  89 y.o.-year-old patient with no acute distress. Frail malnourished LUNGS: Normal breath sounds bilaterally CARDIOVASCULAR: S1, S2 normal.  ABDOMEN: Soft, nontender, nondistended.  EXTREMITIES: No  edema b/l.    NEUROLOGIC: nonfocal  patient is alert and awake SKIN: various stages of ecchymosis in both upper and lower extremities Pressure Injury Coccyx Mid Stage 1 -  Intact skin with non-blanchable redness of a localized area usually over a bony prominence. pink blanchable area on cocyx (Active)     Location: Coccyx  Location Orientation: Mid  Staging: Stage 1 -  Intact skin with non-blanchable redness of a localized area usually over a bony prominence.  Wound Description (Comments): pink blanchable area on cocyx  Present on Admission: Yes     Pressure Injury 02/02/24 Arm Left;Lower;Posterior (Active)  02/02/24 1200  Location: Arm  Location Orientation: Left;Lower;Posterior  Staging:   Wound Description (Comments):   Present on Admission:       LABORATORY PANEL:  CBC Recent Labs  Lab 02/07/24 0506  WBC 6.8  HGB 10.3*  HCT 31.5*  PLT 103*    Chemistries  Recent Labs  Lab 02/02/24 0238 02/03/24 0007 02/05/24 0639 02/06/24 0554 02/07/24 0506  NA 133*   < > 140   < > 141  K 5.2*   < > 4.2   < > 4.1  CL 97*   < > 106   < > 108  CO2 23   < > 24   < > 20*  GLUCOSE 112*   < > 79   < > 88  BUN 38*   < > 38*   < > 47*  CREATININE 2.22*   < > 1.88*   < > 1.68*   CALCIUM 9.0   < > 8.9   < > 9.2  MG  --    < > 2.0  --   --   AST 17  --   --   --   --   ALT 14  --   --   --   --   ALKPHOS 104  --   --   --   --   BILITOT 1.1  --   --   --   --    < > = values in this interval not displayed.    Assessment and Plan  89 y.o. female with PMHx significant can't for A.fib on Eliquis, HTN who presented with AMS following a fall at home. Found to have small Cerebellar ICH, Neurosurgery following and not recommending surgical intervention. Palliative consulted following goals of care conversation with pts daughter pt will discharge to hospice at her previous facility vs. hospice home.   Small cerebellar ICH status post fall Acute metabolic encephalopathy-improved Acute Delirium PMHx: A.fib on Eliquis Failure to thrive --Eliquis discontinued --Maintain adequate blood pressure control --Patient was seen by neurosurgery and no surgical intervention recommended --TOC working on memory care facility --Patient's family have agreed for discharge with hospice  Acute kidney injury~improving  Mild Hyponatremia~improving --Creatinine currently at baseline which is 1.8   Anemia Mild Thrombocytopenia~worsening  --stable   History of AAA (abdominal aortic aneurysm) [facility]): S/p of EVAR.  --Our radiologist reported enlargement of aneurysm with zide of 10.3 x 9.7 cm, however recent CT scan done at the [facility] on 10/12/2023 showed size of 7.9 x 9.9 cm. The size seems to be similar.  Family does not want to pursue any aggressive w/u   Hypertension --Continue metoprolol   Coronary artery disease: No chest pain -Patient stopped taking Crestor per her daughter   Atrial fibrillation, chronic (HCC) -Continue metoprolol and amiodarone --Eliquis on hold in the setting of ICH   Chronic diastolic CHF (congestive heart failure) (HCC):  --2D echo on 04/26/2023 showed EF > 50% with grade 3 diastolic dysfunction.   --Patient does not have leg edema JVD.  -- CHF seem to be  compensated.  Pt is best at present for discharge See TOC notes for d/c plannning   Family communication :none today Consults :Neurosurgery CODE STATUS: DNR DVT Prophylaxis :TEDS Level of care: Med-Surg Status is: Inpatient Remains inpatient appropriate because: Awaiting d/c plans to finalize    TOTAL TIME TAKING CARE OF THIS PATIENT: 35 minutes.  >50% time spent on counselling and coordination of care  Note: This dictation was prepared with Dragon dictation along with smaller phrase technology. Any transcriptional errors that result from this process are unintentional.  Enedina Finner M.D    [facility]   CC: Primary care physician; [place], [place] Bailiff, MD

## 2024-02-07 NOTE — Plan of Care (Signed)
  Problem: Intracerebral Hemorrhage Tissue Perfusion: Goal: Complications of Intracerebral Hemorrhage will be minimized Outcome: Progressing   

## 2024-02-07 NOTE — Plan of Care (Signed)
  Problem: Pain Managment: Goal: General experience of comfort will improve and/or be controlled Outcome: Progressing   Problem: Safety: Goal: Ability to remain free from injury will improve Outcome: Progressing

## 2024-02-08 DIAGNOSIS — R627 Adult failure to thrive: Secondary | ICD-10-CM | POA: Diagnosis not present

## 2024-02-08 DIAGNOSIS — L8995 Pressure ulcer of unspecified site, unstageable: Secondary | ICD-10-CM | POA: Diagnosis not present

## 2024-02-08 DIAGNOSIS — I614 Nontraumatic intracerebral hemorrhage in cerebellum: Secondary | ICD-10-CM | POA: Diagnosis not present

## 2024-02-08 MED ORDER — SCOPOLAMINE 1 MG/3DAYS TD PT72
1.0000 | MEDICATED_PATCH | TRANSDERMAL | Status: DC
  Administered 2024-02-08: 1.5 mg via TRANSDERMAL
  Filled 2024-02-08: qty 1

## 2024-02-08 MED ORDER — SCOPOLAMINE 1 MG/3DAYS TD PT72: 1.0000 | MEDICATED_PATCH | TRANSDERMAL | 12 refills | Status: DC

## 2024-02-08 MED ORDER — MORPHINE SULFATE (CONCENTRATE) 10 MG /0.5 ML PO SOLN: 10.0000 mg | ORAL | 0 refills | Status: DC | PRN

## 2024-02-08 MED ORDER — MORPHINE SULFATE (CONCENTRATE) 10 MG /0.5 ML PO SOLN: 10.0000 mg | ORAL | Status: DC | PRN

## 2024-02-08 MED ORDER — LORAZEPAM 2 MG/ML IJ SOLN: 1.0000 mg | INTRAMUSCULAR | Status: DC | PRN

## 2024-02-08 MED ORDER — LORAZEPAM 2 MG/ML IJ SOLN: 1.0000 mg | INTRAMUSCULAR | 0 refills | Status: DC | PRN

## 2024-02-08 NOTE — Progress Notes (Addendum)
   02/08/24 1400  Spiritual Encounters  Type of Visit Initial  Care provided to: Pt and family  Referral source Nurse (RN/NT/LPN)  Reason for visit Routine spiritual support  OnCall Visit No  Interventions  Spiritual Care Interventions Made Established relationship of care and support;Compassionate presence;Prayer  Intervention Outcomes  Outcomes Connection to spiritual care   Chaplain provide compassionate presence, prayer, and spiritual support.

## 2024-02-08 NOTE — TOC Transition Note (Signed)
Transition of Care [facility]) - Discharge Note   Patient Details  Name: Claire Kelley MRN: [id number] Date of Birth: 10-15-1926  Transition of Care [facility]) CM/SW Contact:  Garret Reddish, RN Phone Number: 02/08/2024, 10:39 AM   Clinical Narrative:    Chart reviewed.  Patient has shown signs of decline.  [facility] as evaluated for Hospice Home and has accepted.  Patient will discharge to the Hospice Home today.    [facility] to coordinator Hospice transfer to Hospice Home today.    Staff nurse made aware.     Final next level of care: Hospice Medical Facility [facility]) Barriers to Discharge: No Barriers Identified   Patient Goals and CMS Choice Patient states their goals for this hospitalization and ongoing recovery are:: LTC with hospice CMS Medicare.gov Compare Post Acute Care list provided to:: Patient Represenative (must comment) (Patient's daughter Claire Kelley) Choice offered to / list presented to : Adult Children  ownership interest in [facility].provided to:: Adult Children    Discharge Placement                Patient to be transferred to facility by: Ambulance transport arranged via [facility] Name of family member notified: Claire Kelley Patient and family notified of of transfer: 02/08/24  Discharge Plan and Services Additional resources added to the After Visit Summary for                                       Social Drivers of Health (SDOH) Interventions SDOH Screenings   Food Insecurity: No Food Insecurity (02/02/2024)  Housing: Low Risk  (02/02/2024)  Transportation Needs: No Transportation Needs (02/02/2024)  Utilities: Not At Risk (02/02/2024)  Financial Resource Strain: Low Risk  (04/29/2023)   Received from [facility], [facility]  Social Connections: Moderately Isolated (02/02/2024)  Tobacco Use: Medium Risk (02/02/2024)     Readmission  Risk Interventions     No data to display

## 2024-02-08 NOTE — Progress Notes (Signed)
[facility] Liaison note  Patient approved for [facility] to [facility] today.  Family agreeable to transfer. RN notified to call report to the Hospice Home by calling [phone].    Thank you for the opportunity to participate in this patient's care.    [facility] Liaison [phone]

## 2024-02-08 NOTE — Discharge Summary (Signed)
Physician Discharge Summary   Patient: Claire Kelley MRN: [id number] DOB: 03/18/1926  Admit date:     02/02/2024  Discharge date: 02/08/24  Discharge Physician: Claire Kelley   PCP: Claire Abrahams, MD   Discharge Diagnoses: Principal Problem:   ICH (intracerebral hemorrhage) (HCC) Active Problems:   Cerebellar bleed (HCC)   Failure to thrive in adult   Pressure injury of skin  89 y.o. female with PMHx significant can't for A.fib on Eliquis, HTN who presented with AMS following a fall at home. Found to have small Cerebellar ICH, Neurosurgery following and not recommending surgical intervention. Palliative consulted following goals of care conversation with pts daughter pt will discharge to hospice at her previous facility vs. hospice home.    Small cerebellar ICH status post fall Acute metabolic encephalopathy-improved Acute Delirium PMHx: A.fib on Eliquis Failure to thrive --Eliquis discontinued --Patient was seen by neurosurgery and no surgical intervention recommended --Patient's family have agreed for discharge to hospice facility with overall decline. Pt is comfort care   Acute kidney injury~improving  Mild Hyponatremia~improving --Creatinine currently at baseline which is 1.8   Anemia Mild Thrombocytopenia~worsening   History of AAA (abdominal aortic aneurysm) [facility]): S/p of EVAR.    Hypertension   Coronary artery disease: No chest pain   Atrial fibrillation, chronic (HCC)   Chronic diastolic CHF (congestive heart failure) (HCC):  --2D echo on 04/26/2023 showed EF > 50% with grade 3 diastolic dysfunction.   -- CHF seem to be compensated.   D/c to hospice facility. Poor prognosis given overall decline   Family communication :dters at bedside Consults :Neurosurgery CODE STATUS: DNR   Disposition: Hospice care Diet recommendation:  Discharge Diet Orders (From admission, onward)     Start     Ordered   02/08/24 0000  Diet - low sodium heart healthy         02/08/24 1037            DISCHARGE MEDICATION: Allergies as of 02/08/2024       Reactions   Bactrim [sulfamethoxazole-trimethoprim] Other (See Comments)   Reaction:  Unknown    Ciprocinonide [fluocinolone] Other (See Comments)   Reaction:  Unknown    Ciprofloxacin Other (See Comments)   Codeine    Lipitor [atorvastatin] Other (See Comments)   Reaction:  Leg cramps    Macrobid [nitrofurantoin Monohyd Macro] Other (See Comments)   Reaction:  Unknown    Paxil [paroxetine Hcl] Other (See Comments)   Reaction:  Made pt "loopy"        Medication List     STOP taking these medications    acetaminophen 500 MG tablet Commonly known as: TYLENOL   amiodarone 200 MG tablet Commonly known as: PACERONE   Biotin 1000 MCG Chew   Cranberry 500 MG Caps   Eliquis 2.5 MG Tabs tablet Generic drug: apixaban   furosemide 20 MG tablet Commonly known as: LASIX   hydrALAZINE 25 MG tablet Commonly known as: APRESOLINE   magnesium gluconate 500 MG tablet Commonly known as: MAGONATE   melatonin 5 MG Tabs   metoprolol succinate 25 MG 24 hr tablet Commonly known as: TOPROL-XL   oxybutynin 5 MG 24 hr tablet Commonly known as: DITROPAN-XL   pantoprazole 40 MG tablet Commonly known as: PROTONIX   rosuvastatin 5 MG tablet Commonly known as: CRESTOR   senna-docusate 8.6-50 MG tablet Commonly known as: Senokot-S   Vitamin D3 25 MCG (1000 UT) Caps   vitamin D3 25 MCG tablet Commonly known as: CHOLECALCIFEROL  TAKE these medications    LORazepam 2 MG/ML injection Commonly known as: ATIVAN Inject 0.5 mLs (1 mg total) into the vein every 4 (four) hours as needed for anxiety or sedation.   morphine CONCENTRATE 10 mg / 0.5 ml concentrated solution Take 0.5 mLs (10 mg total) by mouth every 2 (two) hours as needed for severe pain (pain score 7-10) or shortness of breath.   scopolamine 1 MG/3DAYS Commonly known as: TRANSDERM-SCOP Place 1 patch (1.5 mg total) onto the  skin every 3 (three) days.               Discharge Care Instructions  (From admission, onward)           Start     Ordered   02/08/24 0000  Discharge wound care:       Comments: Active Pressure Injury/Wound(s)     Pressure Ulcer  Duration         Pressure Injury Coccyx Mid Stage 1 -  Intact skin with non-blanchable redness of a localized area usually over a bony prominence. pink blanchable area on cocyx -- days   Pressure Injury 02/02/24 Arm Left;Lower;Posterior 5 days   02/08/24 1037            Follow-up Information     [place], [place] Bailiff, MD Follow up.   Specialty: Family Medicine Why: Hospital follow up Contact information: [address] [place] [address] [phone]                   Condition at discharge: poor  The results of significant diagnostics from this hospitalization (including imaging, microbiology, ancillary and laboratory) are listed below for reference.   Imaging Studies: ECHOCARDIOGRAM COMPLETE Result Date: 02/03/2024    ECHOCARDIOGRAM REPORT   Patient Name:   Claire Kelley Date of Exam: 02/03/2024 Medical Rec #:  [id number]      Height:       65.0 in Accession #:    [id number]     Weight:       110.4 lb Date of Birth:  06-08-1926      BSA:          1.537 m Patient Age:    89 years       BP:           148/86 mmHg Patient Gender: F              HR:           61 bpm. Exam Location:  [facility] Procedure: 2D Echo (Both Spectral and Color Flow Doppler were utilized during            procedure). Indications:     Stroke I63.9  History:         Patient has no prior history of Echocardiogram examinations.  Sonographer:     Overton Mam RDCS, FASE Referring Phys:  [id number] Claire Kelley Diagnosing Phys: Claire Nordmann MD  Sonographer Comments: Challenging study due to patient position, test performed on the patient's right side. IMPRESSIONS  1. Left ventricular ejection fraction, by estimation, is 45 to 50%. Left ventricular ejection fraction by 2D  MOD biplane is 45.4 %. The left ventricle has mildly decreased function. The left ventricle has no regional wall motion abnormalities. There is moderate left ventricular hypertrophy. Left ventricular diastolic parameters are indeterminate.  2. Right ventricular systolic function is normal. The right ventricular size is normal. There is moderately elevated pulmonary artery systolic pressure. The estimated right ventricular systolic pressure  is 46.5 mmHg.  3. Left atrial size was severely dilated.  4. The mitral valve is normal in structure. Moderate mitral valve regurgitation. No evidence of mitral stenosis.  5. Tricuspid valve regurgitation is moderate.  6. The aortic valve is normal in structure. There is moderate calcification of the aortic valve. Aortic valve regurgitation is mild. Aortic valve sclerosis/calcification is present, without any evidence of aortic stenosis.  7. The inferior vena cava is dilated in size with >50% respiratory variability, suggesting right atrial pressure of 8 mmHg. FINDINGS  Left Ventricle: Left ventricular ejection fraction, by estimation, is 45 to 50%. Left ventricular ejection fraction by 2D MOD biplane is 45.4 %. The left ventricle has mildly decreased function. The left ventricle has no regional wall motion abnormalities. The left ventricular internal cavity size was normal in size. There is moderate left ventricular hypertrophy. Left ventricular diastolic parameters are indeterminate. Right Ventricle: The right ventricular size is normal. No increase in right ventricular wall thickness. Right ventricular systolic function is normal. There is moderately elevated pulmonary artery systolic pressure. The tricuspid regurgitant velocity is 3.02 m/s, and with an assumed right atrial pressure of 10 mmHg, the estimated right ventricular systolic pressure is 46.5 mmHg. Left Atrium: Left atrial size was severely dilated. Right Atrium: Right atrial size was normal in size. Pericardium: There  is no evidence of pericardial effusion. Mitral Valve: The mitral valve is normal in structure. Moderate mitral valve regurgitation. No evidence of mitral valve stenosis. Tricuspid Valve: The tricuspid valve is normal in structure. Tricuspid valve regurgitation is moderate . No evidence of tricuspid stenosis. Aortic Valve: The aortic valve is normal in structure. There is moderate calcification of the aortic valve. Aortic valve regurgitation is mild. Aortic regurgitation PHT measures 757 msec. Aortic valve sclerosis/calcification is present, without any evidence of aortic stenosis. Aortic valve peak gradient measures 7.1 mmHg. Pulmonic Valve: The pulmonic valve was normal in structure. Pulmonic valve regurgitation is not visualized. No evidence of pulmonic stenosis. Aorta: The aortic root is normal in size and structure. Venous: The inferior vena cava is dilated in size with greater than 50% respiratory variability, suggesting right atrial pressure of 8 mmHg. IAS/Shunts: No atrial level shunt detected by color flow Doppler. Additional Comments: 3D imaging was not performed. A device lead is visualized.  LEFT VENTRICLE PLAX 2D                        Biplane EF (MOD) LVIDd:         3.00 cm         LV Biplane EF:   Left LVIDs:         2.50 cm                          ventricular LV PW:         1.40 cm                          ejection LV IVS:        1.30 cm                          fraction by LVOT diam:     1.90 cm                          2D MOD LV SV:  43                               biplane is LV SV Index:   28                               45.4 %. LVOT Area:     2.84 cm                                Diastology                                LV e' medial:    7.07 cm/s LV Volumes (MOD)               LV E/e' medial:  8.5 LV vol d, MOD    42.3 ml       LV e' lateral:   9.03 cm/s A2C:                           LV E/e' lateral: 6.7 LV vol d, MOD    53.1 ml A4C: LV vol s, MOD    22.5 ml A2C: LV vol s, MOD     29.0 ml A4C: LV SV MOD A2C:   19.8 ml LV SV MOD A4C:   53.1 ml LV SV MOD BP:    21.6 ml RIGHT VENTRICLE RV Basal diam:  2.60 cm RV S prime:     8.81 cm/s TAPSE (M-mode): 1.7 cm LEFT ATRIUM           Index        RIGHT ATRIUM           Index LA diam:      4.80 cm 3.12 cm/m   RA Area:     15.80 cm LA Vol (A2C): 89.7 ml 58.36 ml/m  RA Volume:   33.10 ml  21.54 ml/m LA Vol (A4C): 74.3 ml 48.34 ml/m  AORTIC VALVE AV Area (Vmax): 1.31 cm AV Vmax:        133.50 cm/s AV Peak Grad:   7.1 mmHg LVOT Vmax:      61.50 cm/s   PULMONARY ARTERY LVOT Vmean:     39.500 cm/s  MPA diam:        2.40 cm LVOT VTI:       0.153 m AI PHT:         757 msec  AORTA Ao Root diam: 3.50 cm Ao Asc diam:  3.30 cm MITRAL VALVE               TRICUSPID VALVE MV Area (PHT): 4.31 cm    TR Peak grad:   36.5 mmHg MV Decel Time: 176 msec    TR Vmax:        302.00 cm/s MR Peak grad: 126.3 mmHg MR Mean grad: 80.0 mmHg    SHUNTS MR Vmax:      562.00 cm/s  Systemic VTI:  0.15 m MR Vmean:     419.0 cm/s   Systemic Diam: 1.90 cm MV E velocity: 60.30 cm/s MV A velocity: 41.40 cm/s MV E/A ratio:  1.46 Claire Nordmann MD Electronically signed by Claire Nordmann MD Signature Date/Time: 02/03/2024/3:19:51 PM  Final    CT Head Wo Contrast Result Date: 02/02/2024 CLINICAL DATA:  Altered mental status. EXAM: CT HEAD WITHOUT CONTRAST TECHNIQUE: Contiguous axial images were obtained from the base of the skull through the vertex without intravenous contrast. RADIATION DOSE REDUCTION: This exam was performed according to the departmental dose-optimization program which includes automated exposure control, adjustment of the mA and/or kV according to patient size and/or use of iterative reconstruction technique. COMPARISON:  Head CT 02/02/2024 2:45 a.m. FINDINGS: Brain: The small focus of acute hemorrhage in the cerebellar vermis has slightly enlarged, now measuring 7 mm. There is no significant surrounding edema. No new intracranial hemorrhage, acute large territory  infarct, midline shift, or extra-axial fluid collection is identified. There is mild-to-moderate cerebral atrophy. Patchy cerebral white matter hypodensities are unchanged and nonspecific but compatible with moderate small vessel ischemic disease. Vascular: Calcified atherosclerosis at the skull base. No hyperdense vessel. Skull: No fracture or suspicious osseous lesion. Sinuses/Orbits: Sequelae of chronic sinusitis primarily involving the maxillary and sphenoid sinuses. Clear mastoid air cells. Unremarkable orbits. Other: None. IMPRESSION: 1. Slight enlargement of a small acute hemorrhage in the cerebellar vermis, now 7 mm. No significant edema or mass effect. 2. No new intracranial abnormality. 3. Moderate chronic small vessel ischemic disease. Electronically Signed   By: Sebastian Ache M.D.   On: 02/02/2024 10:11   CT HEAD WO CONTRAST ( ) Result Date: 02/02/2024 CLINICAL DATA:  Head trauma, altered mental status. Unwitnessed fall today. EXAM: CT HEAD WITHOUT CONTRAST CT CERVICAL SPINE WITHOUT CONTRAST TECHNIQUE: Multidetector CT imaging of the head and cervical spine was performed following the standard protocol without intravenous contrast. Multiplanar CT image reconstructions of the cervical spine were also generated. RADIATION DOSE REDUCTION: This exam was performed according to the departmental dose-optimization program which includes automated exposure control, adjustment of the mA and/or kV according to patient size and/or use of iterative reconstruction technique. COMPARISON:  None Available. FINDINGS: CT HEAD FINDINGS Brain: Small focus of intraparenchymal hemorrhage in the cerebellar vermis measuring 4 mm (series 2/image 8). No significant mass effect. No evidence of acute infarct. No hydrocephalus. No extra-axial fluid collection. Age-commensurate cerebral atrophy and chronic small vessel ischemic disease. Vascular: No hyperdense vessel. Intracranial arterial calcification. Skull: No fracture or  focal lesion. Sinuses/Orbits: No acute finding. Bony changes from chronic maxillary and sphenoid sinusitis. No mastoid effusion Other: None. CT CERVICAL SPINE FINDINGS Alignment: No evidence of traumatic listhesis. Skull base and vertebrae: No acute fracture. Soft tissues and spinal canal: No prevertebral fluid or swelling. No visible canal hematoma. Disc levels: Multilevel age commensurate spondylosis. No severe spinal canal narrowing. Upper chest: No acute abnormality. Other: Carotid calcification. IMPRESSION: 1. Small focus of intraparenchymal hemorrhage in the cerebellar vermis measuring 4 mm. No significant mass effect. 2. No acute fracture in the cervical spine. Critical Value/emergent results were called by telephone at the time of interpretation on 02/02/2024 at 3:07 am to provider [facility] , who verbally acknowledged these results. Electronically Signed   By: Minerva Fester M.D.   On: 02/02/2024 03:08   CT Cervical Spine Wo Contrast Result Date: 02/02/2024 CLINICAL DATA:  Head trauma, altered mental status. Unwitnessed fall today. EXAM: CT HEAD WITHOUT CONTRAST CT CERVICAL SPINE WITHOUT CONTRAST TECHNIQUE: Multidetector CT imaging of the head and cervical spine was performed following the standard protocol without intravenous contrast. Multiplanar CT image reconstructions of the cervical spine were also generated. RADIATION DOSE REDUCTION: This exam was performed according to the departmental dose-optimization program which includes automated exposure control,  adjustment of the mA and/or kV according to patient size and/or use of iterative reconstruction technique. COMPARISON:  None Available. FINDINGS: CT HEAD FINDINGS Brain: Small focus of intraparenchymal hemorrhage in the cerebellar vermis measuring 4 mm (series 2/image 8). No significant mass effect. No evidence of acute infarct. No hydrocephalus. No extra-axial fluid collection. Age-commensurate cerebral atrophy and chronic small vessel ischemic  disease. Vascular: No hyperdense vessel. Intracranial arterial calcification. Skull: No fracture or focal lesion. Sinuses/Orbits: No acute finding. Bony changes from chronic maxillary and sphenoid sinusitis. No mastoid effusion Other: None. CT CERVICAL SPINE FINDINGS Alignment: No evidence of traumatic listhesis. Skull base and vertebrae: No acute fracture. Soft tissues and spinal canal: No prevertebral fluid or swelling. No visible canal hematoma. Disc levels: Multilevel age commensurate spondylosis. No severe spinal canal narrowing. Upper chest: No acute abnormality. Other: Carotid calcification. IMPRESSION: 1. Small focus of intraparenchymal hemorrhage in the cerebellar vermis measuring 4 mm. No significant mass effect. 2. No acute fracture in the cervical spine. Critical Value/emergent results were called by telephone at the time of interpretation on 02/02/2024 at 3:07 am to provider [facility] , who verbally acknowledged these results. Electronically Signed   By: Minerva Fester M.D.   On: 02/02/2024 03:08    Microbiology: Results for orders placed or performed during the hospital encounter of 12/31/23  Urine Culture     Status: Abnormal   Collection Time: 12/31/23  4:49 PM   Specimen: Urine, Clean Catch  Result Value Ref Range Status   Specimen Description   Final    URINE, CLEAN CATCH Performed at [facility], [address], [place], [place] [address]    Special Requests   Final    NONE Performed at [facility], [address] [place], [place] [address]    Culture MULTIPLE SPECIES PRESENT, SUGGEST RECOLLECTION (A)  Final   Report Status 01/02/2024 FINAL  Final  Resp panel by RT-PCR (RSV, Flu A&B, Covid) Anterior Nasal Swab     Status: None   Collection Time: 12/31/23  7:42 PM   Specimen: Anterior Nasal Swab  Result Value Ref Range Status   SARS Coronavirus 2 by RT PCR NEGATIVE NEGATIVE Final    Comment: (NOTE) SARS-CoV-2 target nucleic acids are NOT  DETECTED.  The SARS-CoV-2 RNA is generally detectable in upper respiratory specimens during the acute phase of infection. The lowest concentration of SARS-CoV-2 viral copies this assay can detect is 138 copies/mL. A negative result does not preclude SARS-Cov-2 infection and should not be used as the sole basis for treatment or other patient management decisions. A negative result may occur with  improper specimen collection/handling, submission of specimen other than nasopharyngeal swab, presence of viral mutation(s) within the areas targeted by this assay, and inadequate number of viral copies(<138 copies/mL). A negative result must be combined with clinical observations, patient history, and epidemiological information. The expected result is Negative.  Fact Sheet for Patients:  BloggerCourse.com  Fact Sheet for Healthcare Providers:  SeriousBroker.it  This test is no t yet approved or cleared by the [place] FDA and  has been authorized for detection and/or diagnosis of SARS-CoV-2 by FDA under an Emergency Use Authorization (EUA). This EUA will remain  in effect (meaning this test can be used) for the duration of the COVID-19 declaration under Section 564(b)(1) of the Act, 21 U.S.C.section 360bbb-3(b)(1), unless the authorization is terminated  or revoked sooner.       Influenza A by PCR NEGATIVE NEGATIVE Final   Influenza  B by PCR NEGATIVE NEGATIVE Final    Comment: (NOTE) The Xpert Xpress SARS-CoV-2/FLU/RSV plus assay is intended as an aid in the diagnosis of influenza from Nasopharyngeal swab specimens and should not be used as a sole basis for treatment. Nasal washings and aspirates are unacceptable for Xpert Xpress SARS-CoV-2/FLU/RSV testing.  Fact Sheet for Patients: BloggerCourse.com  Fact Sheet for Healthcare Providers: SeriousBroker.it  This test is not yet  approved or cleared by the [place] FDA and has been authorized for detection and/or diagnosis of SARS-CoV-2 by FDA under an Emergency Use Authorization (EUA). This EUA will remain in effect (meaning this test can be used) for the duration of the COVID-19 declaration under Section 564(b)(1) of the Act, 21 U.S.C. section 360bbb-3(b)(1), unless the authorization is terminated or revoked.     Resp Syncytial Virus by PCR NEGATIVE NEGATIVE Final    Comment: (NOTE) Fact Sheet for Patients: BloggerCourse.com  Fact Sheet for Healthcare Providers: SeriousBroker.it  This test is not yet approved or cleared by the [place] FDA and has been authorized for detection and/or diagnosis of SARS-CoV-2 by FDA under an Emergency Use Authorization (EUA). This EUA will remain in effect (meaning this test can be used) for the duration of the COVID-19 declaration under Section 564(b)(1) of the Act, 21 U.S.C. section 360bbb-3(b)(1), unless the authorization is terminated or revoked.  Performed at [facility], [address] [place], [place] [address]     Labs: CBC: Recent Labs  Lab 02/03/24 0345 02/04/24 0534 02/05/24 0639 02/06/24 0554 02/07/24 0506  WBC 5.9 11.6* 9.5 8.7 6.8  HGB 8.8* 9.8* 9.6* 9.8* 10.3*  HCT 26.7* 29.1* 28.7* 29.8* 31.5*  MCV 102.7* 99.3 101.8* 101.0* 102.3*  PLT 91* 101* 101* 96* 103*   Basic Metabolic Panel: Recent Labs  Lab 02/03/24 0345 02/03/24 0346 02/04/24 0534 02/05/24 0639 02/06/24 0554 02/07/24 0506  NA  --  136 139 140 141 141  K  --  4.3 4.5 4.2 4.0 4.1  CL  --  104 105 106 106 108  CO2  --  22 23 24 24  20*  GLUCOSE  --  127* 88 79 90 88  BUN  --  33* 35* 38* 40* 47*  CREATININE  --  1.80* 2.04* 1.88* 1.56* 1.68*  CALCIUM  --  8.8* 9.0 8.9 9.2 9.2  MG 1.9  --   --  2.0  --   --   PHOS  --  4.0 3.4 3.4 3.3 3.9   Liver Function Tests: Recent Labs  Lab 02/02/24 0238  02/03/24 0346 02/04/24 0534 02/05/24 0639 02/06/24 0554 02/07/24 0506  AST 17  --   --   --   --   --   ALT 14  --   --   --   --   --   ALKPHOS 104  --   --   --   --   --   BILITOT 1.1  --   --   --   --   --   PROT 6.9  --   --   --   --   --   ALBUMIN 3.8 3.0* 3.3* 3.2* 3.1* 3.1*   CBG: Recent Labs  Lab 02/02/24 0649 02/04/24 1501  GLUCAP 100* 93    Discharge time spent: greater than 30 minutes.  Signed: Enedina Finner, MD [facility] 02/08/2024

## 2024-02-08 NOTE — Progress Notes (Signed)
Speech Language Pathology Treatment: Dysphagia  Patient Details Name: Claire Kelley MRN: [id number] DOB: December 10, 1926 Today's Date: 02/08/2024 Time: 0900-0909 SLP Time Calculation (min) (ACUTE ONLY): 9 min  Assessment / Plan / Recommendation Clinical Impression  Pt's family at bedside. Chart reviewed with pt to discharge with Hospice services. Per family report (request) pt's diet has been liberalized in effort to accommodate any request by pt. They report pt with request but held food in her mouth so they removed bolus. Ice chips also fall out of her mouth. Family is aware of aspiration precautions and high risk of choking/aspiration given pt's declined status.   Will also sign off of order for Speech Language Evaluation d/t continued lethargy and current POC to be followed by Hospice. Pt's family states that pt continues to have fluctuating periods of intelligible and unintelligible speech "which is to be expected." At this time, pt no longer benefits from skilled ST services and family has demonstrated understanding of current medical condition.    HPI HPI: 89 y.o. female, DNR/DNI,  with PMHx significant can't for A.fib on Eliquis, HTN who presented with AMS following a fall at home.  Found to have small Cerebellar ICH. CT Head 02/02/24: 1. Slight enlargement of a small acute hemorrhage in the cerebellar  vermis, now 7 mm. No significant edema or mass effect. No new intracranial abnormality. Moderate chronic small vessel ischemic disease.      SLP Plan  Discharge SLP treatment due to (comment) (pt not able to participate in skilled dysphagia therapy)      Recommendations for follow up therapy are one component of a multi-disciplinary discharge planning process, led by the attending physician.  Recommendations may be updated based on patient status, additional functional criteria and insurance authorization.    Recommendations  Diet recommendations:  (family requested diet liberation on  02/07/2024) Supervision: Staff to assist with self feeding;Full supervision/cueing for compensatory strategies Compensations: Minimize environmental distractions;Slow rate;Small sips/bites;Follow solids with liquid Postural Changes and/or Swallow Maneuvers: Seated upright 90 degrees;Upright 30-60 min after meal                  Oral care BID;Oral care before and after PO   Frequent or constant Supervision/Assistance Dysphagia, oral phase (R13.11)     Discharge SLP treatment due to (comment) (pt not able to participate in skilled dysphagia therapy)    Wilman Tucker B. Dreama Saa, M.S., CCC-SLP, Tree surgeon Certified Brain Injury Specialist [facility]  [facility] Rehabilitation Services Office [phone] Ascom [phone] Fax [phone]

## 2024-02-17 DEATH — deceased
